# Patient Record
Sex: Female | Born: 1935 | Race: White | Hispanic: No | Marital: Single | Smoking: Former smoker
Health system: Southern US, Community
[De-identification: ages and names within clinical notes are randomized; demographics above are authoritative.]

## PROBLEM LIST (undated history)

## (undated) DIAGNOSIS — I2510 Atherosclerotic heart disease of native coronary artery without angina pectoris: Secondary | ICD-10-CM

## (undated) DIAGNOSIS — Q899 Congenital malformation, unspecified: Secondary | ICD-10-CM

## (undated) DIAGNOSIS — E079 Disorder of thyroid, unspecified: Secondary | ICD-10-CM

## (undated) DIAGNOSIS — K5790 Diverticulosis of intestine, part unspecified, without perforation or abscess without bleeding: Secondary | ICD-10-CM

## (undated) DIAGNOSIS — D649 Anemia, unspecified: Secondary | ICD-10-CM

## (undated) DIAGNOSIS — F0390 Unspecified dementia without behavioral disturbance: Secondary | ICD-10-CM

## (undated) DIAGNOSIS — C801 Malignant (primary) neoplasm, unspecified: Secondary | ICD-10-CM

## (undated) DIAGNOSIS — Z8739 Personal history of other diseases of the musculoskeletal system and connective tissue: Secondary | ICD-10-CM

## (undated) DIAGNOSIS — I10 Essential (primary) hypertension: Secondary | ICD-10-CM

## (undated) DIAGNOSIS — M1990 Unspecified osteoarthritis, unspecified site: Secondary | ICD-10-CM

## (undated) DIAGNOSIS — M810 Age-related osteoporosis without current pathological fracture: Secondary | ICD-10-CM

## (undated) HISTORY — PX: BOWEL RESECTION: SHX1257

## (undated) HISTORY — PX: ROTATOR CUFF REPAIR: SHX139

## (undated) HISTORY — DX: Congenital malformation, unspecified: Q89.9

## (undated) HISTORY — DX: Unspecified dementia, unspecified severity, without behavioral disturbance, psychotic disturbance, mood disturbance, and anxiety: F03.90

## (undated) HISTORY — DX: Age-related osteoporosis without current pathological fracture: M81.0

## (undated) HISTORY — DX: Malignant (primary) neoplasm, unspecified: C80.1

## (undated) HISTORY — DX: Atherosclerotic heart disease of native coronary artery without angina pectoris: I25.10

## (undated) HISTORY — DX: Unspecified osteoarthritis, unspecified site: M19.90

## (undated) HISTORY — PX: OTHER SURGICAL HISTORY: SHX169

## (undated) HISTORY — DX: Disorder of thyroid, unspecified: E07.9

## (undated) HISTORY — DX: Essential (primary) hypertension: I10

## (undated) HISTORY — DX: Personal history of other diseases of the musculoskeletal system and connective tissue: Z87.39

## (undated) HISTORY — DX: Diverticulosis of intestine, part unspecified, without perforation or abscess without bleeding: K57.90

## (undated) HISTORY — PX: ABDOMINAL HYSTERECTOMY: SHX81

## (undated) HISTORY — PX: COLOSTOMY: SHX63

## (undated) HISTORY — DX: Anemia, unspecified: D64.9

---

## 2011-10-04 DIAGNOSIS — Z87898 Personal history of other specified conditions: Secondary | ICD-10-CM | POA: Diagnosis not present

## 2011-11-04 DIAGNOSIS — Z87898 Personal history of other specified conditions: Secondary | ICD-10-CM | POA: Diagnosis not present

## 2011-12-02 DIAGNOSIS — Z87898 Personal history of other specified conditions: Secondary | ICD-10-CM | POA: Diagnosis not present

## 2012-01-02 DIAGNOSIS — Z87898 Personal history of other specified conditions: Secondary | ICD-10-CM | POA: Diagnosis not present

## 2012-02-01 DIAGNOSIS — K219 Gastro-esophageal reflux disease without esophagitis: Secondary | ICD-10-CM | POA: Diagnosis not present

## 2012-02-01 DIAGNOSIS — J309 Allergic rhinitis, unspecified: Secondary | ICD-10-CM | POA: Diagnosis not present

## 2012-02-01 DIAGNOSIS — Z87898 Personal history of other specified conditions: Secondary | ICD-10-CM | POA: Diagnosis not present

## 2012-02-01 DIAGNOSIS — J342 Deviated nasal septum: Secondary | ICD-10-CM | POA: Diagnosis not present

## 2012-03-03 DIAGNOSIS — Z87898 Personal history of other specified conditions: Secondary | ICD-10-CM | POA: Diagnosis not present

## 2012-04-02 DIAGNOSIS — Z87898 Personal history of other specified conditions: Secondary | ICD-10-CM | POA: Diagnosis not present

## 2012-04-04 DIAGNOSIS — Z0000 Encounter for general adult medical examination without abnormal findings: Secondary | ICD-10-CM | POA: Diagnosis not present

## 2012-04-17 DIAGNOSIS — Z0000 Encounter for general adult medical examination without abnormal findings: Secondary | ICD-10-CM | POA: Diagnosis not present

## 2012-04-17 DIAGNOSIS — E039 Hypothyroidism, unspecified: Secondary | ICD-10-CM | POA: Diagnosis not present

## 2012-04-17 DIAGNOSIS — Z79899 Other long term (current) drug therapy: Secondary | ICD-10-CM | POA: Diagnosis not present

## 2012-05-03 DIAGNOSIS — Z87898 Personal history of other specified conditions: Secondary | ICD-10-CM | POA: Diagnosis not present

## 2012-05-21 DIAGNOSIS — M1990 Unspecified osteoarthritis, unspecified site: Secondary | ICD-10-CM | POA: Diagnosis not present

## 2012-06-03 DIAGNOSIS — Z87898 Personal history of other specified conditions: Secondary | ICD-10-CM | POA: Diagnosis not present

## 2012-07-03 DIAGNOSIS — Z87898 Personal history of other specified conditions: Secondary | ICD-10-CM | POA: Diagnosis not present

## 2012-07-09 DIAGNOSIS — Z23 Encounter for immunization: Secondary | ICD-10-CM | POA: Diagnosis not present

## 2012-08-03 DIAGNOSIS — Z87898 Personal history of other specified conditions: Secondary | ICD-10-CM | POA: Diagnosis not present

## 2012-08-15 DIAGNOSIS — H6120 Impacted cerumen, unspecified ear: Secondary | ICD-10-CM | POA: Diagnosis not present

## 2012-08-15 DIAGNOSIS — J309 Allergic rhinitis, unspecified: Secondary | ICD-10-CM | POA: Diagnosis not present

## 2012-08-17 DIAGNOSIS — H25019 Cortical age-related cataract, unspecified eye: Secondary | ICD-10-CM | POA: Diagnosis not present

## 2012-08-17 DIAGNOSIS — H2510 Age-related nuclear cataract, unspecified eye: Secondary | ICD-10-CM | POA: Diagnosis not present

## 2012-08-17 DIAGNOSIS — H18459 Nodular corneal degeneration, unspecified eye: Secondary | ICD-10-CM | POA: Diagnosis not present

## 2012-08-17 DIAGNOSIS — H353190 Nonexudative age-related macular degeneration, unspecified eye, stage unspecified: Secondary | ICD-10-CM | POA: Diagnosis not present

## 2012-09-02 DIAGNOSIS — Z87898 Personal history of other specified conditions: Secondary | ICD-10-CM | POA: Diagnosis not present

## 2012-09-06 DIAGNOSIS — M6740 Ganglion, unspecified site: Secondary | ICD-10-CM | POA: Diagnosis not present

## 2012-10-03 DIAGNOSIS — Z87898 Personal history of other specified conditions: Secondary | ICD-10-CM | POA: Diagnosis not present

## 2012-10-15 DIAGNOSIS — M6740 Ganglion, unspecified site: Secondary | ICD-10-CM | POA: Diagnosis not present

## 2012-10-19 DIAGNOSIS — M6740 Ganglion, unspecified site: Secondary | ICD-10-CM | POA: Diagnosis not present

## 2012-11-03 DIAGNOSIS — Z87898 Personal history of other specified conditions: Secondary | ICD-10-CM | POA: Diagnosis not present

## 2012-11-05 DIAGNOSIS — M76899 Other specified enthesopathies of unspecified lower limb, excluding foot: Secondary | ICD-10-CM | POA: Diagnosis not present

## 2012-11-05 DIAGNOSIS — M6740 Ganglion, unspecified site: Secondary | ICD-10-CM | POA: Diagnosis not present

## 2012-11-19 DIAGNOSIS — J209 Acute bronchitis, unspecified: Secondary | ICD-10-CM | POA: Diagnosis not present

## 2012-11-19 DIAGNOSIS — R05 Cough: Secondary | ICD-10-CM | POA: Diagnosis not present

## 2012-11-19 DIAGNOSIS — J189 Pneumonia, unspecified organism: Secondary | ICD-10-CM | POA: Diagnosis not present

## 2012-11-21 DIAGNOSIS — J189 Pneumonia, unspecified organism: Secondary | ICD-10-CM | POA: Diagnosis not present

## 2012-11-21 DIAGNOSIS — R05 Cough: Secondary | ICD-10-CM | POA: Diagnosis not present

## 2012-11-21 DIAGNOSIS — J209 Acute bronchitis, unspecified: Secondary | ICD-10-CM | POA: Diagnosis not present

## 2012-11-21 DIAGNOSIS — R059 Cough, unspecified: Secondary | ICD-10-CM | POA: Diagnosis not present

## 2012-11-21 DIAGNOSIS — R0602 Shortness of breath: Secondary | ICD-10-CM | POA: Diagnosis not present

## 2012-11-22 DIAGNOSIS — R791 Abnormal coagulation profile: Secondary | ICD-10-CM | POA: Diagnosis not present

## 2012-11-22 DIAGNOSIS — R0602 Shortness of breath: Secondary | ICD-10-CM | POA: Diagnosis not present

## 2012-11-22 DIAGNOSIS — R05 Cough: Secondary | ICD-10-CM | POA: Diagnosis not present

## 2012-12-01 DIAGNOSIS — Z87898 Personal history of other specified conditions: Secondary | ICD-10-CM | POA: Diagnosis not present

## 2012-12-27 DIAGNOSIS — K649 Unspecified hemorrhoids: Secondary | ICD-10-CM | POA: Diagnosis not present

## 2012-12-27 DIAGNOSIS — B356 Tinea cruris: Secondary | ICD-10-CM | POA: Diagnosis not present

## 2012-12-31 DIAGNOSIS — M545 Low back pain: Secondary | ICD-10-CM | POA: Diagnosis not present

## 2013-01-01 DIAGNOSIS — Z87898 Personal history of other specified conditions: Secondary | ICD-10-CM | POA: Diagnosis not present

## 2013-01-09 DIAGNOSIS — R300 Dysuria: Secondary | ICD-10-CM | POA: Diagnosis not present

## 2013-01-09 DIAGNOSIS — N899 Noninflammatory disorder of vagina, unspecified: Secondary | ICD-10-CM | POA: Diagnosis not present

## 2013-01-14 DIAGNOSIS — M4800 Spinal stenosis, site unspecified: Secondary | ICD-10-CM | POA: Diagnosis not present

## 2013-01-14 DIAGNOSIS — M545 Low back pain: Secondary | ICD-10-CM | POA: Diagnosis not present

## 2013-01-23 DIAGNOSIS — N760 Acute vaginitis: Secondary | ICD-10-CM | POA: Diagnosis not present

## 2013-01-24 DIAGNOSIS — M545 Low back pain: Secondary | ICD-10-CM | POA: Diagnosis not present

## 2013-01-24 DIAGNOSIS — M4800 Spinal stenosis, site unspecified: Secondary | ICD-10-CM | POA: Diagnosis not present

## 2013-01-30 DIAGNOSIS — M545 Low back pain: Secondary | ICD-10-CM | POA: Diagnosis not present

## 2013-01-30 DIAGNOSIS — M4800 Spinal stenosis, site unspecified: Secondary | ICD-10-CM | POA: Diagnosis not present

## 2013-01-31 DIAGNOSIS — M4800 Spinal stenosis, site unspecified: Secondary | ICD-10-CM | POA: Diagnosis not present

## 2013-01-31 DIAGNOSIS — M545 Low back pain: Secondary | ICD-10-CM | POA: Diagnosis not present

## 2013-01-31 DIAGNOSIS — Z87898 Personal history of other specified conditions: Secondary | ICD-10-CM | POA: Diagnosis not present

## 2013-02-08 DIAGNOSIS — I10 Essential (primary) hypertension: Secondary | ICD-10-CM | POA: Diagnosis not present

## 2013-02-08 DIAGNOSIS — L259 Unspecified contact dermatitis, unspecified cause: Secondary | ICD-10-CM | POA: Diagnosis not present

## 2013-02-08 DIAGNOSIS — K625 Hemorrhage of anus and rectum: Secondary | ICD-10-CM | POA: Diagnosis not present

## 2013-02-08 DIAGNOSIS — K219 Gastro-esophageal reflux disease without esophagitis: Secondary | ICD-10-CM | POA: Diagnosis not present

## 2013-02-11 DIAGNOSIS — M4800 Spinal stenosis, site unspecified: Secondary | ICD-10-CM | POA: Diagnosis not present

## 2013-02-11 DIAGNOSIS — M545 Low back pain: Secondary | ICD-10-CM | POA: Diagnosis not present

## 2013-02-14 DIAGNOSIS — H6120 Impacted cerumen, unspecified ear: Secondary | ICD-10-CM | POA: Diagnosis not present

## 2013-02-14 DIAGNOSIS — K1370 Unspecified lesions of oral mucosa: Secondary | ICD-10-CM | POA: Diagnosis not present

## 2013-03-03 DIAGNOSIS — Z87898 Personal history of other specified conditions: Secondary | ICD-10-CM | POA: Diagnosis not present

## 2013-03-21 DIAGNOSIS — K1370 Unspecified lesions of oral mucosa: Secondary | ICD-10-CM | POA: Diagnosis not present

## 2013-04-01 DIAGNOSIS — R1312 Dysphagia, oropharyngeal phase: Secondary | ICD-10-CM | POA: Diagnosis not present

## 2013-04-01 DIAGNOSIS — D649 Anemia, unspecified: Secondary | ICD-10-CM | POA: Diagnosis not present

## 2013-04-01 DIAGNOSIS — K1370 Unspecified lesions of oral mucosa: Secondary | ICD-10-CM | POA: Diagnosis not present

## 2013-04-01 DIAGNOSIS — Z79899 Other long term (current) drug therapy: Secondary | ICD-10-CM | POA: Diagnosis not present

## 2013-04-01 DIAGNOSIS — M2550 Pain in unspecified joint: Secondary | ICD-10-CM | POA: Diagnosis not present

## 2013-04-01 DIAGNOSIS — R5383 Other fatigue: Secondary | ICD-10-CM | POA: Diagnosis not present

## 2013-04-01 DIAGNOSIS — F29 Unspecified psychosis not due to a substance or known physiological condition: Secondary | ICD-10-CM | POA: Diagnosis not present

## 2013-04-01 DIAGNOSIS — N390 Urinary tract infection, site not specified: Secondary | ICD-10-CM | POA: Diagnosis not present

## 2013-04-01 DIAGNOSIS — R5381 Other malaise: Secondary | ICD-10-CM | POA: Diagnosis not present

## 2013-04-01 DIAGNOSIS — K219 Gastro-esophageal reflux disease without esophagitis: Secondary | ICD-10-CM | POA: Diagnosis not present

## 2013-04-01 DIAGNOSIS — E559 Vitamin D deficiency, unspecified: Secondary | ICD-10-CM | POA: Diagnosis not present

## 2013-04-01 DIAGNOSIS — E860 Dehydration: Secondary | ICD-10-CM | POA: Diagnosis not present

## 2013-04-01 DIAGNOSIS — F4489 Other dissociative and conversion disorders: Secondary | ICD-10-CM | POA: Diagnosis not present

## 2013-04-01 DIAGNOSIS — J342 Deviated nasal septum: Secondary | ICD-10-CM | POA: Diagnosis not present

## 2013-04-01 DIAGNOSIS — J309 Allergic rhinitis, unspecified: Secondary | ICD-10-CM | POA: Diagnosis not present

## 2013-04-01 DIAGNOSIS — E039 Hypothyroidism, unspecified: Secondary | ICD-10-CM | POA: Diagnosis not present

## 2013-04-01 DIAGNOSIS — F068 Other specified mental disorders due to known physiological condition: Secondary | ICD-10-CM | POA: Diagnosis not present

## 2013-04-02 DIAGNOSIS — Z87898 Personal history of other specified conditions: Secondary | ICD-10-CM | POA: Diagnosis not present

## 2013-04-03 DIAGNOSIS — R634 Abnormal weight loss: Secondary | ICD-10-CM | POA: Diagnosis not present

## 2013-04-03 DIAGNOSIS — K56609 Unspecified intestinal obstruction, unspecified as to partial versus complete obstruction: Secondary | ICD-10-CM | POA: Diagnosis not present

## 2013-04-03 DIAGNOSIS — Z8542 Personal history of malignant neoplasm of other parts of uterus: Secondary | ICD-10-CM | POA: Diagnosis not present

## 2013-04-03 DIAGNOSIS — K5289 Other specified noninfective gastroenteritis and colitis: Secondary | ICD-10-CM | POA: Diagnosis not present

## 2013-04-03 DIAGNOSIS — K5730 Diverticulosis of large intestine without perforation or abscess without bleeding: Secondary | ICD-10-CM | POA: Diagnosis not present

## 2013-04-03 DIAGNOSIS — D649 Anemia, unspecified: Secondary | ICD-10-CM | POA: Diagnosis present

## 2013-04-03 DIAGNOSIS — J9600 Acute respiratory failure, unspecified whether with hypoxia or hypercapnia: Secondary | ICD-10-CM | POA: Diagnosis not present

## 2013-04-03 DIAGNOSIS — K6389 Other specified diseases of intestine: Secondary | ICD-10-CM | POA: Diagnosis not present

## 2013-04-03 DIAGNOSIS — R079 Chest pain, unspecified: Secondary | ICD-10-CM | POA: Diagnosis not present

## 2013-04-03 DIAGNOSIS — K838 Other specified diseases of biliary tract: Secondary | ICD-10-CM | POA: Diagnosis not present

## 2013-04-03 DIAGNOSIS — R6883 Chills (without fever): Secondary | ICD-10-CM | POA: Diagnosis not present

## 2013-04-03 DIAGNOSIS — M2550 Pain in unspecified joint: Secondary | ICD-10-CM | POA: Diagnosis not present

## 2013-04-03 DIAGNOSIS — R404 Transient alteration of awareness: Secondary | ICD-10-CM | POA: Diagnosis not present

## 2013-04-03 DIAGNOSIS — K660 Peritoneal adhesions (postprocedural) (postinfection): Secondary | ICD-10-CM | POA: Diagnosis not present

## 2013-04-03 DIAGNOSIS — K1370 Unspecified lesions of oral mucosa: Secondary | ICD-10-CM | POA: Diagnosis present

## 2013-04-03 DIAGNOSIS — F0390 Unspecified dementia without behavioral disturbance: Secondary | ICD-10-CM | POA: Diagnosis present

## 2013-04-03 DIAGNOSIS — R109 Unspecified abdominal pain: Secondary | ICD-10-CM | POA: Diagnosis not present

## 2013-04-03 DIAGNOSIS — K651 Peritoneal abscess: Secondary | ICD-10-CM | POA: Diagnosis not present

## 2013-04-03 DIAGNOSIS — A419 Sepsis, unspecified organism: Secondary | ICD-10-CM | POA: Diagnosis not present

## 2013-04-03 DIAGNOSIS — Z5331 Laparoscopic surgical procedure converted to open procedure: Secondary | ICD-10-CM | POA: Diagnosis not present

## 2013-04-03 DIAGNOSIS — M1990 Unspecified osteoarthritis, unspecified site: Secondary | ICD-10-CM | POA: Diagnosis present

## 2013-04-03 DIAGNOSIS — R5381 Other malaise: Secondary | ICD-10-CM | POA: Diagnosis not present

## 2013-04-03 DIAGNOSIS — Z452 Encounter for adjustment and management of vascular access device: Secondary | ICD-10-CM | POA: Diagnosis not present

## 2013-04-03 DIAGNOSIS — N184 Chronic kidney disease, stage 4 (severe): Secondary | ICD-10-CM | POA: Diagnosis not present

## 2013-04-03 DIAGNOSIS — D72829 Elevated white blood cell count, unspecified: Secondary | ICD-10-CM | POA: Diagnosis not present

## 2013-04-03 DIAGNOSIS — A09 Infectious gastroenteritis and colitis, unspecified: Secondary | ICD-10-CM | POA: Diagnosis not present

## 2013-04-03 DIAGNOSIS — K626 Ulcer of anus and rectum: Secondary | ICD-10-CM | POA: Diagnosis not present

## 2013-04-03 DIAGNOSIS — K631 Perforation of intestine (nontraumatic): Secondary | ICD-10-CM | POA: Diagnosis not present

## 2013-04-03 DIAGNOSIS — K565 Intestinal adhesions [bands], unspecified as to partial versus complete obstruction: Secondary | ICD-10-CM | POA: Diagnosis not present

## 2013-04-03 DIAGNOSIS — K650 Generalized (acute) peritonitis: Secondary | ICD-10-CM | POA: Diagnosis not present

## 2013-04-03 DIAGNOSIS — E43 Unspecified severe protein-calorie malnutrition: Secondary | ICD-10-CM | POA: Diagnosis not present

## 2013-04-03 DIAGNOSIS — R198 Other specified symptoms and signs involving the digestive system and abdomen: Secondary | ICD-10-CM | POA: Diagnosis not present

## 2013-04-03 DIAGNOSIS — F4489 Other dissociative and conversion disorders: Secondary | ICD-10-CM | POA: Diagnosis not present

## 2013-04-03 DIAGNOSIS — E860 Dehydration: Secondary | ICD-10-CM | POA: Diagnosis not present

## 2013-04-03 DIAGNOSIS — R5383 Other fatigue: Secondary | ICD-10-CM | POA: Diagnosis not present

## 2013-04-03 DIAGNOSIS — I959 Hypotension, unspecified: Secondary | ICD-10-CM | POA: Diagnosis not present

## 2013-04-03 DIAGNOSIS — F068 Other specified mental disorders due to known physiological condition: Secondary | ICD-10-CM | POA: Diagnosis not present

## 2013-04-03 DIAGNOSIS — K5732 Diverticulitis of large intestine without perforation or abscess without bleeding: Secondary | ICD-10-CM | POA: Diagnosis not present

## 2013-04-03 DIAGNOSIS — N390 Urinary tract infection, site not specified: Secondary | ICD-10-CM | POA: Diagnosis not present

## 2013-04-04 DIAGNOSIS — Z452 Encounter for adjustment and management of vascular access device: Secondary | ICD-10-CM | POA: Diagnosis not present

## 2013-04-05 DIAGNOSIS — K838 Other specified diseases of biliary tract: Secondary | ICD-10-CM | POA: Diagnosis not present

## 2013-04-05 DIAGNOSIS — R198 Other specified symptoms and signs involving the digestive system and abdomen: Secondary | ICD-10-CM | POA: Diagnosis not present

## 2013-04-05 DIAGNOSIS — K5732 Diverticulitis of large intestine without perforation or abscess without bleeding: Secondary | ICD-10-CM | POA: Diagnosis not present

## 2013-04-06 DIAGNOSIS — Z4682 Encounter for fitting and adjustment of non-vascular catheter: Secondary | ICD-10-CM | POA: Diagnosis not present

## 2013-04-06 DIAGNOSIS — I10 Essential (primary) hypertension: Secondary | ICD-10-CM | POA: Diagnosis not present

## 2013-04-06 DIAGNOSIS — R188 Other ascites: Secondary | ICD-10-CM | POA: Diagnosis not present

## 2013-04-06 DIAGNOSIS — G92 Toxic encephalopathy: Secondary | ICD-10-CM | POA: Diagnosis not present

## 2013-04-06 DIAGNOSIS — B029 Zoster without complications: Secondary | ICD-10-CM | POA: Diagnosis present

## 2013-04-06 DIAGNOSIS — A419 Sepsis, unspecified organism: Secondary | ICD-10-CM | POA: Diagnosis not present

## 2013-04-06 DIAGNOSIS — J9600 Acute respiratory failure, unspecified whether with hypoxia or hypercapnia: Secondary | ICD-10-CM | POA: Diagnosis not present

## 2013-04-06 DIAGNOSIS — I369 Nonrheumatic tricuspid valve disorder, unspecified: Secondary | ICD-10-CM | POA: Diagnosis not present

## 2013-04-06 DIAGNOSIS — I5033 Acute on chronic diastolic (congestive) heart failure: Secondary | ICD-10-CM | POA: Diagnosis present

## 2013-04-06 DIAGNOSIS — E872 Acidosis: Secondary | ICD-10-CM | POA: Diagnosis not present

## 2013-04-06 DIAGNOSIS — I2789 Other specified pulmonary heart diseases: Secondary | ICD-10-CM | POA: Diagnosis not present

## 2013-04-06 DIAGNOSIS — Z8542 Personal history of malignant neoplasm of other parts of uterus: Secondary | ICD-10-CM | POA: Diagnosis not present

## 2013-04-06 DIAGNOSIS — E8779 Other fluid overload: Secondary | ICD-10-CM | POA: Diagnosis not present

## 2013-04-06 DIAGNOSIS — K560 Paralytic ileus: Secondary | ICD-10-CM | POA: Diagnosis not present

## 2013-04-06 DIAGNOSIS — K650 Generalized (acute) peritonitis: Secondary | ICD-10-CM | POA: Diagnosis not present

## 2013-04-06 DIAGNOSIS — K631 Perforation of intestine (nontraumatic): Secondary | ICD-10-CM | POA: Diagnosis not present

## 2013-04-06 DIAGNOSIS — J811 Chronic pulmonary edema: Secondary | ICD-10-CM | POA: Diagnosis not present

## 2013-04-06 DIAGNOSIS — Z5189 Encounter for other specified aftercare: Secondary | ICD-10-CM | POA: Diagnosis not present

## 2013-04-06 DIAGNOSIS — Z933 Colostomy status: Secondary | ICD-10-CM | POA: Diagnosis not present

## 2013-04-06 DIAGNOSIS — R6521 Severe sepsis with septic shock: Secondary | ICD-10-CM | POA: Diagnosis present

## 2013-04-06 DIAGNOSIS — R279 Unspecified lack of coordination: Secondary | ICD-10-CM | POA: Diagnosis not present

## 2013-04-06 DIAGNOSIS — E43 Unspecified severe protein-calorie malnutrition: Secondary | ICD-10-CM | POA: Diagnosis not present

## 2013-04-06 DIAGNOSIS — R0602 Shortness of breath: Secondary | ICD-10-CM | POA: Diagnosis not present

## 2013-04-06 DIAGNOSIS — I059 Rheumatic mitral valve disease, unspecified: Secondary | ICD-10-CM | POA: Diagnosis not present

## 2013-04-06 DIAGNOSIS — D649 Anemia, unspecified: Secondary | ICD-10-CM | POA: Diagnosis not present

## 2013-04-06 DIAGNOSIS — G4700 Insomnia, unspecified: Secondary | ICD-10-CM | POA: Diagnosis present

## 2013-04-06 DIAGNOSIS — R6889 Other general symptoms and signs: Secondary | ICD-10-CM | POA: Diagnosis not present

## 2013-04-06 DIAGNOSIS — J90 Pleural effusion, not elsewhere classified: Secondary | ICD-10-CM | POA: Diagnosis not present

## 2013-04-06 DIAGNOSIS — K5730 Diverticulosis of large intestine without perforation or abscess without bleeding: Secondary | ICD-10-CM | POA: Diagnosis not present

## 2013-04-06 DIAGNOSIS — F0390 Unspecified dementia without behavioral disturbance: Secondary | ICD-10-CM | POA: Diagnosis present

## 2013-04-06 DIAGNOSIS — R894 Abnormal immunological findings in specimens from other organs, systems and tissues: Secondary | ICD-10-CM | POA: Diagnosis not present

## 2013-04-06 DIAGNOSIS — K630 Abscess of intestine: Secondary | ICD-10-CM | POA: Diagnosis present

## 2013-04-06 DIAGNOSIS — M1990 Unspecified osteoarthritis, unspecified site: Secondary | ICD-10-CM | POA: Diagnosis present

## 2013-04-06 DIAGNOSIS — R5381 Other malaise: Secondary | ICD-10-CM | POA: Diagnosis not present

## 2013-04-06 DIAGNOSIS — R1310 Dysphagia, unspecified: Secondary | ICD-10-CM | POA: Diagnosis not present

## 2013-04-06 DIAGNOSIS — M949 Disorder of cartilage, unspecified: Secondary | ICD-10-CM | POA: Diagnosis present

## 2013-04-06 DIAGNOSIS — I2510 Atherosclerotic heart disease of native coronary artery without angina pectoris: Secondary | ICD-10-CM | POA: Diagnosis not present

## 2013-04-06 DIAGNOSIS — K5732 Diverticulitis of large intestine without perforation or abscess without bleeding: Secondary | ICD-10-CM | POA: Diagnosis present

## 2013-04-06 DIAGNOSIS — I959 Hypotension, unspecified: Secondary | ICD-10-CM | POA: Diagnosis not present

## 2013-04-06 DIAGNOSIS — K651 Peritoneal abscess: Secondary | ICD-10-CM | POA: Diagnosis not present

## 2013-04-06 DIAGNOSIS — K56609 Unspecified intestinal obstruction, unspecified as to partial versus complete obstruction: Secondary | ICD-10-CM | POA: Diagnosis not present

## 2013-04-06 DIAGNOSIS — D62 Acute posthemorrhagic anemia: Secondary | ICD-10-CM | POA: Diagnosis not present

## 2013-04-06 DIAGNOSIS — R269 Unspecified abnormalities of gait and mobility: Secondary | ICD-10-CM | POA: Diagnosis not present

## 2013-04-06 DIAGNOSIS — E039 Hypothyroidism, unspecified: Secondary | ICD-10-CM | POA: Diagnosis present

## 2013-04-06 DIAGNOSIS — C579 Malignant neoplasm of female genital organ, unspecified: Secondary | ICD-10-CM | POA: Diagnosis not present

## 2013-04-06 DIAGNOSIS — R0902 Hypoxemia: Secondary | ICD-10-CM | POA: Diagnosis not present

## 2013-04-06 DIAGNOSIS — N179 Acute kidney failure, unspecified: Secondary | ICD-10-CM | POA: Diagnosis not present

## 2013-04-06 DIAGNOSIS — J9819 Other pulmonary collapse: Secondary | ICD-10-CM | POA: Diagnosis not present

## 2013-04-06 DIAGNOSIS — J189 Pneumonia, unspecified organism: Secondary | ICD-10-CM | POA: Diagnosis not present

## 2013-04-24 DIAGNOSIS — R5381 Other malaise: Secondary | ICD-10-CM | POA: Diagnosis not present

## 2013-04-24 DIAGNOSIS — K631 Perforation of intestine (nontraumatic): Secondary | ICD-10-CM | POA: Diagnosis not present

## 2013-04-24 DIAGNOSIS — R21 Rash and other nonspecific skin eruption: Secondary | ICD-10-CM | POA: Diagnosis not present

## 2013-04-24 DIAGNOSIS — K5730 Diverticulosis of large intestine without perforation or abscess without bleeding: Secondary | ICD-10-CM | POA: Diagnosis not present

## 2013-04-24 DIAGNOSIS — K921 Melena: Secondary | ICD-10-CM | POA: Diagnosis not present

## 2013-04-24 DIAGNOSIS — R269 Unspecified abnormalities of gait and mobility: Secondary | ICD-10-CM | POA: Diagnosis not present

## 2013-04-24 DIAGNOSIS — Z933 Colostomy status: Secondary | ICD-10-CM | POA: Diagnosis not present

## 2013-04-24 DIAGNOSIS — Z87898 Personal history of other specified conditions: Secondary | ICD-10-CM | POA: Diagnosis not present

## 2013-04-24 DIAGNOSIS — Z5189 Encounter for other specified aftercare: Secondary | ICD-10-CM | POA: Diagnosis not present

## 2013-04-24 DIAGNOSIS — I10 Essential (primary) hypertension: Secondary | ICD-10-CM | POA: Diagnosis not present

## 2013-04-24 DIAGNOSIS — D649 Anemia, unspecified: Secondary | ICD-10-CM | POA: Diagnosis not present

## 2013-04-24 DIAGNOSIS — C579 Malignant neoplasm of female genital organ, unspecified: Secondary | ICD-10-CM | POA: Diagnosis not present

## 2013-04-24 DIAGNOSIS — M549 Dorsalgia, unspecified: Secondary | ICD-10-CM | POA: Diagnosis not present

## 2013-04-24 DIAGNOSIS — R6889 Other general symptoms and signs: Secondary | ICD-10-CM | POA: Diagnosis not present

## 2013-04-24 DIAGNOSIS — R059 Cough, unspecified: Secondary | ICD-10-CM | POA: Diagnosis not present

## 2013-04-24 DIAGNOSIS — R279 Unspecified lack of coordination: Secondary | ICD-10-CM | POA: Diagnosis not present

## 2013-04-24 DIAGNOSIS — I2510 Atherosclerotic heart disease of native coronary artery without angina pectoris: Secondary | ICD-10-CM | POA: Diagnosis not present

## 2013-04-24 DIAGNOSIS — F0390 Unspecified dementia without behavioral disturbance: Secondary | ICD-10-CM | POA: Diagnosis not present

## 2013-04-24 DIAGNOSIS — R112 Nausea with vomiting, unspecified: Secondary | ICD-10-CM | POA: Diagnosis not present

## 2013-04-24 DIAGNOSIS — R109 Unspecified abdominal pain: Secondary | ICD-10-CM | POA: Diagnosis not present

## 2013-04-25 DIAGNOSIS — F0390 Unspecified dementia without behavioral disturbance: Secondary | ICD-10-CM | POA: Diagnosis not present

## 2013-04-25 DIAGNOSIS — D649 Anemia, unspecified: Secondary | ICD-10-CM | POA: Diagnosis not present

## 2013-05-13 DIAGNOSIS — M549 Dorsalgia, unspecified: Secondary | ICD-10-CM | POA: Diagnosis not present

## 2013-05-13 DIAGNOSIS — D649 Anemia, unspecified: Secondary | ICD-10-CM | POA: Diagnosis not present

## 2013-05-23 DIAGNOSIS — M549 Dorsalgia, unspecified: Secondary | ICD-10-CM | POA: Diagnosis not present

## 2013-06-16 DIAGNOSIS — D649 Anemia, unspecified: Secondary | ICD-10-CM | POA: Diagnosis not present

## 2013-06-16 DIAGNOSIS — I10 Essential (primary) hypertension: Secondary | ICD-10-CM | POA: Diagnosis not present

## 2013-06-16 DIAGNOSIS — F0390 Unspecified dementia without behavioral disturbance: Secondary | ICD-10-CM | POA: Diagnosis not present

## 2013-06-20 DIAGNOSIS — R109 Unspecified abdominal pain: Secondary | ICD-10-CM | POA: Diagnosis not present

## 2013-06-27 DIAGNOSIS — R112 Nausea with vomiting, unspecified: Secondary | ICD-10-CM | POA: Diagnosis not present

## 2013-06-27 DIAGNOSIS — D649 Anemia, unspecified: Secondary | ICD-10-CM | POA: Diagnosis not present

## 2013-06-27 DIAGNOSIS — K921 Melena: Secondary | ICD-10-CM | POA: Diagnosis not present

## 2013-07-03 DIAGNOSIS — Z87898 Personal history of other specified conditions: Secondary | ICD-10-CM | POA: Diagnosis not present

## 2013-07-05 DIAGNOSIS — D649 Anemia, unspecified: Secondary | ICD-10-CM | POA: Diagnosis not present

## 2013-07-05 DIAGNOSIS — R21 Rash and other nonspecific skin eruption: Secondary | ICD-10-CM | POA: Diagnosis not present

## 2013-07-22 DIAGNOSIS — R05 Cough: Secondary | ICD-10-CM | POA: Diagnosis not present

## 2013-08-03 DIAGNOSIS — Z87898 Personal history of other specified conditions: Secondary | ICD-10-CM | POA: Diagnosis not present

## 2013-08-06 DIAGNOSIS — Z79899 Other long term (current) drug therapy: Secondary | ICD-10-CM | POA: Diagnosis not present

## 2013-08-07 DIAGNOSIS — Z79899 Other long term (current) drug therapy: Secondary | ICD-10-CM | POA: Diagnosis not present

## 2013-08-12 DIAGNOSIS — D649 Anemia, unspecified: Secondary | ICD-10-CM | POA: Diagnosis not present

## 2013-08-12 DIAGNOSIS — R21 Rash and other nonspecific skin eruption: Secondary | ICD-10-CM | POA: Diagnosis not present

## 2013-08-14 DIAGNOSIS — D649 Anemia, unspecified: Secondary | ICD-10-CM | POA: Diagnosis not present

## 2013-08-14 DIAGNOSIS — Z79899 Other long term (current) drug therapy: Secondary | ICD-10-CM | POA: Diagnosis not present

## 2013-08-18 DIAGNOSIS — R21 Rash and other nonspecific skin eruption: Secondary | ICD-10-CM | POA: Diagnosis not present

## 2013-09-02 DIAGNOSIS — Z87898 Personal history of other specified conditions: Secondary | ICD-10-CM | POA: Diagnosis not present

## 2013-09-05 DIAGNOSIS — I2510 Atherosclerotic heart disease of native coronary artery without angina pectoris: Secondary | ICD-10-CM | POA: Diagnosis not present

## 2013-09-05 DIAGNOSIS — D649 Anemia, unspecified: Secondary | ICD-10-CM | POA: Diagnosis not present

## 2013-09-12 DIAGNOSIS — D649 Anemia, unspecified: Secondary | ICD-10-CM | POA: Diagnosis not present

## 2013-09-17 DIAGNOSIS — R279 Unspecified lack of coordination: Secondary | ICD-10-CM | POA: Diagnosis not present

## 2013-09-20 DIAGNOSIS — R279 Unspecified lack of coordination: Secondary | ICD-10-CM | POA: Diagnosis not present

## 2013-09-23 DIAGNOSIS — R279 Unspecified lack of coordination: Secondary | ICD-10-CM | POA: Diagnosis not present

## 2013-09-24 DIAGNOSIS — R279 Unspecified lack of coordination: Secondary | ICD-10-CM | POA: Diagnosis not present

## 2013-09-25 DIAGNOSIS — R279 Unspecified lack of coordination: Secondary | ICD-10-CM | POA: Diagnosis not present

## 2013-09-26 DIAGNOSIS — R279 Unspecified lack of coordination: Secondary | ICD-10-CM | POA: Diagnosis not present

## 2013-09-27 DIAGNOSIS — R279 Unspecified lack of coordination: Secondary | ICD-10-CM | POA: Diagnosis not present

## 2013-10-01 DIAGNOSIS — R279 Unspecified lack of coordination: Secondary | ICD-10-CM | POA: Diagnosis not present

## 2013-10-02 DIAGNOSIS — R279 Unspecified lack of coordination: Secondary | ICD-10-CM | POA: Diagnosis not present

## 2013-10-03 DIAGNOSIS — J111 Influenza due to unidentified influenza virus with other respiratory manifestations: Secondary | ICD-10-CM | POA: Diagnosis not present

## 2013-10-04 DIAGNOSIS — R279 Unspecified lack of coordination: Secondary | ICD-10-CM | POA: Diagnosis not present

## 2013-10-06 DIAGNOSIS — J069 Acute upper respiratory infection, unspecified: Secondary | ICD-10-CM | POA: Diagnosis not present

## 2013-10-08 DIAGNOSIS — R279 Unspecified lack of coordination: Secondary | ICD-10-CM | POA: Diagnosis not present

## 2013-10-10 DIAGNOSIS — J069 Acute upper respiratory infection, unspecified: Secondary | ICD-10-CM | POA: Diagnosis not present

## 2013-10-10 DIAGNOSIS — R279 Unspecified lack of coordination: Secondary | ICD-10-CM | POA: Diagnosis not present

## 2013-10-15 DIAGNOSIS — E538 Deficiency of other specified B group vitamins: Secondary | ICD-10-CM | POA: Diagnosis not present

## 2013-10-15 DIAGNOSIS — D649 Anemia, unspecified: Secondary | ICD-10-CM | POA: Diagnosis not present

## 2013-10-15 DIAGNOSIS — R279 Unspecified lack of coordination: Secondary | ICD-10-CM | POA: Diagnosis not present

## 2013-10-17 DIAGNOSIS — R279 Unspecified lack of coordination: Secondary | ICD-10-CM | POA: Diagnosis not present

## 2013-10-28 DIAGNOSIS — R279 Unspecified lack of coordination: Secondary | ICD-10-CM | POA: Diagnosis not present

## 2013-10-31 DIAGNOSIS — R279 Unspecified lack of coordination: Secondary | ICD-10-CM | POA: Diagnosis not present

## 2013-11-13 DIAGNOSIS — R262 Difficulty in walking, not elsewhere classified: Secondary | ICD-10-CM | POA: Diagnosis not present

## 2013-11-13 DIAGNOSIS — M1710 Unilateral primary osteoarthritis, unspecified knee: Secondary | ICD-10-CM | POA: Diagnosis not present

## 2013-11-13 DIAGNOSIS — F0390 Unspecified dementia without behavioral disturbance: Secondary | ICD-10-CM | POA: Diagnosis not present

## 2013-11-13 DIAGNOSIS — Q71899 Other reduction defects of unspecified upper limb: Secondary | ICD-10-CM | POA: Diagnosis not present

## 2013-11-13 DIAGNOSIS — R279 Unspecified lack of coordination: Secondary | ICD-10-CM | POA: Diagnosis not present

## 2013-11-13 DIAGNOSIS — I10 Essential (primary) hypertension: Secondary | ICD-10-CM | POA: Diagnosis not present

## 2013-11-13 DIAGNOSIS — Z993 Dependence on wheelchair: Secondary | ICD-10-CM | POA: Diagnosis not present

## 2013-11-13 DIAGNOSIS — Z433 Encounter for attention to colostomy: Secondary | ICD-10-CM | POA: Diagnosis not present

## 2013-11-15 DIAGNOSIS — I10 Essential (primary) hypertension: Secondary | ICD-10-CM | POA: Diagnosis not present

## 2013-11-15 DIAGNOSIS — R262 Difficulty in walking, not elsewhere classified: Secondary | ICD-10-CM | POA: Diagnosis not present

## 2013-11-15 DIAGNOSIS — Q71899 Other reduction defects of unspecified upper limb: Secondary | ICD-10-CM | POA: Diagnosis not present

## 2013-11-15 DIAGNOSIS — Z433 Encounter for attention to colostomy: Secondary | ICD-10-CM | POA: Diagnosis not present

## 2013-11-15 DIAGNOSIS — F0390 Unspecified dementia without behavioral disturbance: Secondary | ICD-10-CM | POA: Diagnosis not present

## 2013-11-15 DIAGNOSIS — M1710 Unilateral primary osteoarthritis, unspecified knee: Secondary | ICD-10-CM | POA: Diagnosis not present

## 2013-11-18 DIAGNOSIS — I10 Essential (primary) hypertension: Secondary | ICD-10-CM | POA: Diagnosis not present

## 2013-11-18 DIAGNOSIS — M1710 Unilateral primary osteoarthritis, unspecified knee: Secondary | ICD-10-CM | POA: Diagnosis not present

## 2013-11-18 DIAGNOSIS — Q71899 Other reduction defects of unspecified upper limb: Secondary | ICD-10-CM | POA: Diagnosis not present

## 2013-11-18 DIAGNOSIS — F0390 Unspecified dementia without behavioral disturbance: Secondary | ICD-10-CM | POA: Diagnosis not present

## 2013-11-18 DIAGNOSIS — R262 Difficulty in walking, not elsewhere classified: Secondary | ICD-10-CM | POA: Diagnosis not present

## 2013-11-18 DIAGNOSIS — Z433 Encounter for attention to colostomy: Secondary | ICD-10-CM | POA: Diagnosis not present

## 2013-11-20 DIAGNOSIS — F0390 Unspecified dementia without behavioral disturbance: Secondary | ICD-10-CM | POA: Diagnosis not present

## 2013-11-20 DIAGNOSIS — Z433 Encounter for attention to colostomy: Secondary | ICD-10-CM | POA: Diagnosis not present

## 2013-11-20 DIAGNOSIS — D649 Anemia, unspecified: Secondary | ICD-10-CM | POA: Diagnosis not present

## 2013-11-20 DIAGNOSIS — M1710 Unilateral primary osteoarthritis, unspecified knee: Secondary | ICD-10-CM | POA: Diagnosis not present

## 2013-11-20 DIAGNOSIS — Q71899 Other reduction defects of unspecified upper limb: Secondary | ICD-10-CM | POA: Diagnosis not present

## 2013-11-20 DIAGNOSIS — I10 Essential (primary) hypertension: Secondary | ICD-10-CM | POA: Diagnosis not present

## 2013-11-20 DIAGNOSIS — R262 Difficulty in walking, not elsewhere classified: Secondary | ICD-10-CM | POA: Diagnosis not present

## 2013-11-20 DIAGNOSIS — Z933 Colostomy status: Secondary | ICD-10-CM | POA: Diagnosis not present

## 2013-11-20 DIAGNOSIS — I2510 Atherosclerotic heart disease of native coronary artery without angina pectoris: Secondary | ICD-10-CM | POA: Diagnosis not present

## 2013-11-21 DIAGNOSIS — M1710 Unilateral primary osteoarthritis, unspecified knee: Secondary | ICD-10-CM | POA: Diagnosis not present

## 2013-11-21 DIAGNOSIS — R262 Difficulty in walking, not elsewhere classified: Secondary | ICD-10-CM | POA: Diagnosis not present

## 2013-11-21 DIAGNOSIS — F0390 Unspecified dementia without behavioral disturbance: Secondary | ICD-10-CM | POA: Diagnosis not present

## 2013-11-21 DIAGNOSIS — Z433 Encounter for attention to colostomy: Secondary | ICD-10-CM | POA: Diagnosis not present

## 2013-11-21 DIAGNOSIS — I10 Essential (primary) hypertension: Secondary | ICD-10-CM | POA: Diagnosis not present

## 2013-11-21 DIAGNOSIS — Q71899 Other reduction defects of unspecified upper limb: Secondary | ICD-10-CM | POA: Diagnosis not present

## 2013-11-22 DIAGNOSIS — F0390 Unspecified dementia without behavioral disturbance: Secondary | ICD-10-CM | POA: Diagnosis not present

## 2013-11-22 DIAGNOSIS — Q71899 Other reduction defects of unspecified upper limb: Secondary | ICD-10-CM | POA: Diagnosis not present

## 2013-11-22 DIAGNOSIS — Z433 Encounter for attention to colostomy: Secondary | ICD-10-CM | POA: Diagnosis not present

## 2013-11-22 DIAGNOSIS — I10 Essential (primary) hypertension: Secondary | ICD-10-CM | POA: Diagnosis not present

## 2013-11-22 DIAGNOSIS — R262 Difficulty in walking, not elsewhere classified: Secondary | ICD-10-CM | POA: Diagnosis not present

## 2013-11-22 DIAGNOSIS — M1710 Unilateral primary osteoarthritis, unspecified knee: Secondary | ICD-10-CM | POA: Diagnosis not present

## 2013-11-23 DIAGNOSIS — Z433 Encounter for attention to colostomy: Secondary | ICD-10-CM | POA: Diagnosis not present

## 2013-11-23 DIAGNOSIS — I10 Essential (primary) hypertension: Secondary | ICD-10-CM | POA: Diagnosis not present

## 2013-11-23 DIAGNOSIS — M1710 Unilateral primary osteoarthritis, unspecified knee: Secondary | ICD-10-CM | POA: Diagnosis not present

## 2013-11-23 DIAGNOSIS — F0390 Unspecified dementia without behavioral disturbance: Secondary | ICD-10-CM | POA: Diagnosis not present

## 2013-11-23 DIAGNOSIS — R262 Difficulty in walking, not elsewhere classified: Secondary | ICD-10-CM | POA: Diagnosis not present

## 2013-11-23 DIAGNOSIS — Q71899 Other reduction defects of unspecified upper limb: Secondary | ICD-10-CM | POA: Diagnosis not present

## 2013-11-25 DIAGNOSIS — Z433 Encounter for attention to colostomy: Secondary | ICD-10-CM | POA: Diagnosis not present

## 2013-11-25 DIAGNOSIS — I10 Essential (primary) hypertension: Secondary | ICD-10-CM | POA: Diagnosis not present

## 2013-11-25 DIAGNOSIS — Q71899 Other reduction defects of unspecified upper limb: Secondary | ICD-10-CM | POA: Diagnosis not present

## 2013-11-25 DIAGNOSIS — F0390 Unspecified dementia without behavioral disturbance: Secondary | ICD-10-CM | POA: Diagnosis not present

## 2013-11-25 DIAGNOSIS — M1710 Unilateral primary osteoarthritis, unspecified knee: Secondary | ICD-10-CM | POA: Diagnosis not present

## 2013-11-25 DIAGNOSIS — R262 Difficulty in walking, not elsewhere classified: Secondary | ICD-10-CM | POA: Diagnosis not present

## 2013-11-26 DIAGNOSIS — Q71899 Other reduction defects of unspecified upper limb: Secondary | ICD-10-CM | POA: Diagnosis not present

## 2013-11-26 DIAGNOSIS — I10 Essential (primary) hypertension: Secondary | ICD-10-CM | POA: Diagnosis not present

## 2013-11-26 DIAGNOSIS — Z433 Encounter for attention to colostomy: Secondary | ICD-10-CM | POA: Diagnosis not present

## 2013-11-26 DIAGNOSIS — R262 Difficulty in walking, not elsewhere classified: Secondary | ICD-10-CM | POA: Diagnosis not present

## 2013-11-26 DIAGNOSIS — M1710 Unilateral primary osteoarthritis, unspecified knee: Secondary | ICD-10-CM | POA: Diagnosis not present

## 2013-11-26 DIAGNOSIS — F0390 Unspecified dementia without behavioral disturbance: Secondary | ICD-10-CM | POA: Diagnosis not present

## 2013-11-27 DIAGNOSIS — I10 Essential (primary) hypertension: Secondary | ICD-10-CM | POA: Diagnosis not present

## 2013-11-27 DIAGNOSIS — M1710 Unilateral primary osteoarthritis, unspecified knee: Secondary | ICD-10-CM | POA: Diagnosis not present

## 2013-11-27 DIAGNOSIS — Q71899 Other reduction defects of unspecified upper limb: Secondary | ICD-10-CM | POA: Diagnosis not present

## 2013-11-27 DIAGNOSIS — F0390 Unspecified dementia without behavioral disturbance: Secondary | ICD-10-CM | POA: Diagnosis not present

## 2013-11-27 DIAGNOSIS — Z433 Encounter for attention to colostomy: Secondary | ICD-10-CM | POA: Diagnosis not present

## 2013-11-27 DIAGNOSIS — R262 Difficulty in walking, not elsewhere classified: Secondary | ICD-10-CM | POA: Diagnosis not present

## 2013-11-29 DIAGNOSIS — F0390 Unspecified dementia without behavioral disturbance: Secondary | ICD-10-CM | POA: Diagnosis not present

## 2013-11-29 DIAGNOSIS — I10 Essential (primary) hypertension: Secondary | ICD-10-CM | POA: Diagnosis not present

## 2013-11-29 DIAGNOSIS — Z433 Encounter for attention to colostomy: Secondary | ICD-10-CM | POA: Diagnosis not present

## 2013-11-29 DIAGNOSIS — Q71899 Other reduction defects of unspecified upper limb: Secondary | ICD-10-CM | POA: Diagnosis not present

## 2013-11-29 DIAGNOSIS — R262 Difficulty in walking, not elsewhere classified: Secondary | ICD-10-CM | POA: Diagnosis not present

## 2013-11-29 DIAGNOSIS — M1710 Unilateral primary osteoarthritis, unspecified knee: Secondary | ICD-10-CM | POA: Diagnosis not present

## 2013-12-02 DIAGNOSIS — Q71899 Other reduction defects of unspecified upper limb: Secondary | ICD-10-CM | POA: Diagnosis not present

## 2013-12-02 DIAGNOSIS — R262 Difficulty in walking, not elsewhere classified: Secondary | ICD-10-CM | POA: Diagnosis not present

## 2013-12-02 DIAGNOSIS — F0390 Unspecified dementia without behavioral disturbance: Secondary | ICD-10-CM | POA: Diagnosis not present

## 2013-12-02 DIAGNOSIS — M1710 Unilateral primary osteoarthritis, unspecified knee: Secondary | ICD-10-CM | POA: Diagnosis not present

## 2013-12-02 DIAGNOSIS — Z433 Encounter for attention to colostomy: Secondary | ICD-10-CM | POA: Diagnosis not present

## 2013-12-02 DIAGNOSIS — I10 Essential (primary) hypertension: Secondary | ICD-10-CM | POA: Diagnosis not present

## 2013-12-03 ENCOUNTER — Encounter (INDEPENDENT_AMBULATORY_CARE_PROVIDER_SITE_OTHER): Payer: Self-pay

## 2013-12-03 ENCOUNTER — Telehealth (INDEPENDENT_AMBULATORY_CARE_PROVIDER_SITE_OTHER): Payer: Self-pay

## 2013-12-03 DIAGNOSIS — Z433 Encounter for attention to colostomy: Secondary | ICD-10-CM | POA: Diagnosis not present

## 2013-12-03 DIAGNOSIS — F0390 Unspecified dementia without behavioral disturbance: Secondary | ICD-10-CM | POA: Diagnosis not present

## 2013-12-03 DIAGNOSIS — I10 Essential (primary) hypertension: Secondary | ICD-10-CM | POA: Diagnosis not present

## 2013-12-03 DIAGNOSIS — M1710 Unilateral primary osteoarthritis, unspecified knee: Secondary | ICD-10-CM | POA: Diagnosis not present

## 2013-12-03 DIAGNOSIS — R262 Difficulty in walking, not elsewhere classified: Secondary | ICD-10-CM | POA: Diagnosis not present

## 2013-12-03 DIAGNOSIS — Q71899 Other reduction defects of unspecified upper limb: Secondary | ICD-10-CM | POA: Diagnosis not present

## 2013-12-03 NOTE — Telephone Encounter (Signed)
LM to return call regarding patient's records.  Pls ask for [name] [name].

## 2013-12-04 DIAGNOSIS — M1710 Unilateral primary osteoarthritis, unspecified knee: Secondary | ICD-10-CM | POA: Diagnosis not present

## 2013-12-04 DIAGNOSIS — R262 Difficulty in walking, not elsewhere classified: Secondary | ICD-10-CM | POA: Diagnosis not present

## 2013-12-04 DIAGNOSIS — I10 Essential (primary) hypertension: Secondary | ICD-10-CM | POA: Diagnosis not present

## 2013-12-04 DIAGNOSIS — F0390 Unspecified dementia without behavioral disturbance: Secondary | ICD-10-CM | POA: Diagnosis not present

## 2013-12-04 DIAGNOSIS — Z433 Encounter for attention to colostomy: Secondary | ICD-10-CM | POA: Diagnosis not present

## 2013-12-04 DIAGNOSIS — Q71899 Other reduction defects of unspecified upper limb: Secondary | ICD-10-CM | POA: Diagnosis not present

## 2013-12-06 DIAGNOSIS — I10 Essential (primary) hypertension: Secondary | ICD-10-CM | POA: Diagnosis not present

## 2013-12-06 DIAGNOSIS — R262 Difficulty in walking, not elsewhere classified: Secondary | ICD-10-CM | POA: Diagnosis not present

## 2013-12-06 DIAGNOSIS — Z433 Encounter for attention to colostomy: Secondary | ICD-10-CM | POA: Diagnosis not present

## 2013-12-06 DIAGNOSIS — F0390 Unspecified dementia without behavioral disturbance: Secondary | ICD-10-CM | POA: Diagnosis not present

## 2013-12-06 DIAGNOSIS — M1710 Unilateral primary osteoarthritis, unspecified knee: Secondary | ICD-10-CM | POA: Diagnosis not present

## 2013-12-06 DIAGNOSIS — Q71899 Other reduction defects of unspecified upper limb: Secondary | ICD-10-CM | POA: Diagnosis not present

## 2013-12-09 DIAGNOSIS — F0390 Unspecified dementia without behavioral disturbance: Secondary | ICD-10-CM | POA: Diagnosis not present

## 2013-12-09 DIAGNOSIS — R262 Difficulty in walking, not elsewhere classified: Secondary | ICD-10-CM | POA: Diagnosis not present

## 2013-12-09 DIAGNOSIS — M1710 Unilateral primary osteoarthritis, unspecified knee: Secondary | ICD-10-CM | POA: Diagnosis not present

## 2013-12-09 DIAGNOSIS — Z433 Encounter for attention to colostomy: Secondary | ICD-10-CM | POA: Diagnosis not present

## 2013-12-09 DIAGNOSIS — I10 Essential (primary) hypertension: Secondary | ICD-10-CM | POA: Diagnosis not present

## 2013-12-09 DIAGNOSIS — Q71899 Other reduction defects of unspecified upper limb: Secondary | ICD-10-CM | POA: Diagnosis not present

## 2013-12-10 ENCOUNTER — Ambulatory Visit (INDEPENDENT_AMBULATORY_CARE_PROVIDER_SITE_OTHER): Payer: Medicare Other | Admitting: General Surgery

## 2013-12-10 ENCOUNTER — Encounter (INDEPENDENT_AMBULATORY_CARE_PROVIDER_SITE_OTHER): Payer: Self-pay

## 2013-12-10 ENCOUNTER — Encounter (INDEPENDENT_AMBULATORY_CARE_PROVIDER_SITE_OTHER): Payer: Self-pay | Admitting: General Surgery

## 2013-12-10 VITALS — BP 114/76 | HR 72 | Temp 97.6°F | Resp 16 | Ht 66.0 in | Wt 152.6 lb

## 2013-12-10 DIAGNOSIS — Z933 Colostomy status: Secondary | ICD-10-CM | POA: Diagnosis not present

## 2013-12-10 NOTE — Patient Instructions (Addendum)
I do not recommend having the colostomy reversed.  In my opinion, the risks outweigh the benefits.
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
## 2013-12-11 DIAGNOSIS — Z433 Encounter for attention to colostomy: Secondary | ICD-10-CM | POA: Diagnosis not present

## 2013-12-11 DIAGNOSIS — Q71899 Other reduction defects of unspecified upper limb: Secondary | ICD-10-CM | POA: Diagnosis not present

## 2013-12-11 DIAGNOSIS — I10 Essential (primary) hypertension: Secondary | ICD-10-CM | POA: Diagnosis not present

## 2013-12-11 DIAGNOSIS — M1710 Unilateral primary osteoarthritis, unspecified knee: Secondary | ICD-10-CM | POA: Diagnosis not present

## 2013-12-11 DIAGNOSIS — R262 Difficulty in walking, not elsewhere classified: Secondary | ICD-10-CM | POA: Diagnosis not present

## 2013-12-11 DIAGNOSIS — F0390 Unspecified dementia without behavioral disturbance: Secondary | ICD-10-CM | POA: Diagnosis not present

## 2013-12-12 DIAGNOSIS — F0390 Unspecified dementia without behavioral disturbance: Secondary | ICD-10-CM | POA: Diagnosis not present

## 2013-12-12 DIAGNOSIS — M1710 Unilateral primary osteoarthritis, unspecified knee: Secondary | ICD-10-CM | POA: Diagnosis not present

## 2013-12-12 DIAGNOSIS — I10 Essential (primary) hypertension: Secondary | ICD-10-CM | POA: Diagnosis not present

## 2013-12-12 DIAGNOSIS — R262 Difficulty in walking, not elsewhere classified: Secondary | ICD-10-CM | POA: Diagnosis not present

## 2013-12-12 DIAGNOSIS — Z433 Encounter for attention to colostomy: Secondary | ICD-10-CM | POA: Diagnosis not present

## 2013-12-12 DIAGNOSIS — Q71899 Other reduction defects of unspecified upper limb: Secondary | ICD-10-CM | POA: Diagnosis not present

## 2013-12-17 DIAGNOSIS — M1710 Unilateral primary osteoarthritis, unspecified knee: Secondary | ICD-10-CM | POA: Diagnosis not present

## 2013-12-17 DIAGNOSIS — R262 Difficulty in walking, not elsewhere classified: Secondary | ICD-10-CM | POA: Diagnosis not present

## 2013-12-17 DIAGNOSIS — Z433 Encounter for attention to colostomy: Secondary | ICD-10-CM | POA: Diagnosis not present

## 2013-12-17 DIAGNOSIS — F0390 Unspecified dementia without behavioral disturbance: Secondary | ICD-10-CM | POA: Diagnosis not present

## 2013-12-17 DIAGNOSIS — Q71899 Other reduction defects of unspecified upper limb: Secondary | ICD-10-CM | POA: Diagnosis not present

## 2013-12-17 DIAGNOSIS — I10 Essential (primary) hypertension: Secondary | ICD-10-CM | POA: Diagnosis not present

## 2013-12-20 DIAGNOSIS — Z933 Colostomy status: Secondary | ICD-10-CM | POA: Diagnosis not present

## 2013-12-20 DIAGNOSIS — M25569 Pain in unspecified knee: Secondary | ICD-10-CM | POA: Diagnosis not present

## 2013-12-20 DIAGNOSIS — R262 Difficulty in walking, not elsewhere classified: Secondary | ICD-10-CM | POA: Diagnosis not present

## 2013-12-20 DIAGNOSIS — R413 Other amnesia: Secondary | ICD-10-CM | POA: Diagnosis not present

## 2013-12-20 DIAGNOSIS — R279 Unspecified lack of coordination: Secondary | ICD-10-CM | POA: Diagnosis not present

## 2013-12-20 DIAGNOSIS — M1710 Unilateral primary osteoarthritis, unspecified knee: Secondary | ICD-10-CM | POA: Diagnosis not present

## 2013-12-23 DIAGNOSIS — M1710 Unilateral primary osteoarthritis, unspecified knee: Secondary | ICD-10-CM | POA: Diagnosis not present

## 2013-12-23 DIAGNOSIS — M25569 Pain in unspecified knee: Secondary | ICD-10-CM | POA: Diagnosis not present

## 2013-12-23 DIAGNOSIS — R279 Unspecified lack of coordination: Secondary | ICD-10-CM | POA: Diagnosis not present

## 2013-12-23 DIAGNOSIS — R262 Difficulty in walking, not elsewhere classified: Secondary | ICD-10-CM | POA: Diagnosis not present

## 2013-12-24 DIAGNOSIS — R262 Difficulty in walking, not elsewhere classified: Secondary | ICD-10-CM | POA: Diagnosis not present

## 2013-12-24 DIAGNOSIS — M25569 Pain in unspecified knee: Secondary | ICD-10-CM | POA: Diagnosis not present

## 2013-12-24 DIAGNOSIS — R279 Unspecified lack of coordination: Secondary | ICD-10-CM | POA: Diagnosis not present

## 2013-12-24 DIAGNOSIS — M1710 Unilateral primary osteoarthritis, unspecified knee: Secondary | ICD-10-CM | POA: Diagnosis not present

## 2013-12-25 DIAGNOSIS — M1710 Unilateral primary osteoarthritis, unspecified knee: Secondary | ICD-10-CM | POA: Diagnosis not present

## 2013-12-25 DIAGNOSIS — R279 Unspecified lack of coordination: Secondary | ICD-10-CM | POA: Diagnosis not present

## 2013-12-25 DIAGNOSIS — M25569 Pain in unspecified knee: Secondary | ICD-10-CM | POA: Diagnosis not present

## 2013-12-25 DIAGNOSIS — R262 Difficulty in walking, not elsewhere classified: Secondary | ICD-10-CM | POA: Diagnosis not present

## 2013-12-26 DIAGNOSIS — M1710 Unilateral primary osteoarthritis, unspecified knee: Secondary | ICD-10-CM | POA: Diagnosis not present

## 2013-12-26 DIAGNOSIS — M25569 Pain in unspecified knee: Secondary | ICD-10-CM | POA: Diagnosis not present

## 2013-12-26 DIAGNOSIS — R279 Unspecified lack of coordination: Secondary | ICD-10-CM | POA: Diagnosis not present

## 2013-12-26 DIAGNOSIS — R262 Difficulty in walking, not elsewhere classified: Secondary | ICD-10-CM | POA: Diagnosis not present

## 2013-12-27 DIAGNOSIS — R262 Difficulty in walking, not elsewhere classified: Secondary | ICD-10-CM | POA: Diagnosis not present

## 2013-12-27 DIAGNOSIS — M1710 Unilateral primary osteoarthritis, unspecified knee: Secondary | ICD-10-CM | POA: Diagnosis not present

## 2013-12-27 DIAGNOSIS — M25569 Pain in unspecified knee: Secondary | ICD-10-CM | POA: Diagnosis not present

## 2013-12-27 DIAGNOSIS — R279 Unspecified lack of coordination: Secondary | ICD-10-CM | POA: Diagnosis not present

## 2013-12-30 DIAGNOSIS — R279 Unspecified lack of coordination: Secondary | ICD-10-CM | POA: Diagnosis not present

## 2013-12-30 DIAGNOSIS — M25569 Pain in unspecified knee: Secondary | ICD-10-CM | POA: Diagnosis not present

## 2013-12-30 DIAGNOSIS — R262 Difficulty in walking, not elsewhere classified: Secondary | ICD-10-CM | POA: Diagnosis not present

## 2013-12-30 DIAGNOSIS — M1710 Unilateral primary osteoarthritis, unspecified knee: Secondary | ICD-10-CM | POA: Diagnosis not present

## 2013-12-31 DIAGNOSIS — H2510 Age-related nuclear cataract, unspecified eye: Secondary | ICD-10-CM | POA: Diagnosis not present

## 2013-12-31 DIAGNOSIS — H52209 Unspecified astigmatism, unspecified eye: Secondary | ICD-10-CM | POA: Diagnosis not present

## 2013-12-31 DIAGNOSIS — Z961 Presence of intraocular lens: Secondary | ICD-10-CM | POA: Diagnosis not present

## 2013-12-31 DIAGNOSIS — H179 Unspecified corneal scar and opacity: Secondary | ICD-10-CM | POA: Diagnosis not present

## 2014-01-01 DIAGNOSIS — M25569 Pain in unspecified knee: Secondary | ICD-10-CM | POA: Diagnosis not present

## 2014-01-01 DIAGNOSIS — R279 Unspecified lack of coordination: Secondary | ICD-10-CM | POA: Diagnosis not present

## 2014-01-01 DIAGNOSIS — M1710 Unilateral primary osteoarthritis, unspecified knee: Secondary | ICD-10-CM | POA: Diagnosis not present

## 2014-01-01 DIAGNOSIS — R262 Difficulty in walking, not elsewhere classified: Secondary | ICD-10-CM | POA: Diagnosis not present

## 2014-01-14 ENCOUNTER — Encounter (INDEPENDENT_AMBULATORY_CARE_PROVIDER_SITE_OTHER): Payer: Self-pay

## 2014-01-23 DIAGNOSIS — M25559 Pain in unspecified hip: Secondary | ICD-10-CM | POA: Diagnosis not present

## 2014-01-24 ENCOUNTER — Other Ambulatory Visit: Payer: Self-pay | Admitting: Geriatric Medicine

## 2014-01-24 ENCOUNTER — Ambulatory Visit
Admission: RE | Admit: 2014-01-24 | Discharge: 2014-01-24 | Disposition: A | Discharge status: Home / Self Care | Payer: Medicare Other | Source: Ambulatory Visit | Attending: Geriatric Medicine | Admitting: Geriatric Medicine

## 2014-01-24 DIAGNOSIS — S329XXA Fracture of unspecified parts of lumbosacral spine and pelvis, initial encounter for closed fracture: Secondary | ICD-10-CM

## 2014-01-24 DIAGNOSIS — D649 Anemia, unspecified: Secondary | ICD-10-CM | POA: Diagnosis not present

## 2014-01-24 DIAGNOSIS — N949 Unspecified condition associated with female genital organs and menstrual cycle: Secondary | ICD-10-CM | POA: Diagnosis not present

## 2014-01-24 DIAGNOSIS — E039 Hypothyroidism, unspecified: Secondary | ICD-10-CM | POA: Diagnosis not present

## 2014-01-24 DIAGNOSIS — I10 Essential (primary) hypertension: Secondary | ICD-10-CM | POA: Diagnosis not present

## 2014-01-31 DIAGNOSIS — M25569 Pain in unspecified knee: Secondary | ICD-10-CM | POA: Diagnosis not present

## 2014-01-31 DIAGNOSIS — R279 Unspecified lack of coordination: Secondary | ICD-10-CM | POA: Diagnosis not present

## 2014-01-31 DIAGNOSIS — M1710 Unilateral primary osteoarthritis, unspecified knee: Secondary | ICD-10-CM | POA: Diagnosis not present

## 2014-01-31 DIAGNOSIS — R262 Difficulty in walking, not elsewhere classified: Secondary | ICD-10-CM | POA: Diagnosis not present

## 2014-02-03 DIAGNOSIS — R279 Unspecified lack of coordination: Secondary | ICD-10-CM | POA: Diagnosis not present

## 2014-02-03 DIAGNOSIS — M25569 Pain in unspecified knee: Secondary | ICD-10-CM | POA: Diagnosis not present

## 2014-02-03 DIAGNOSIS — M1710 Unilateral primary osteoarthritis, unspecified knee: Secondary | ICD-10-CM | POA: Diagnosis not present

## 2014-02-03 DIAGNOSIS — R262 Difficulty in walking, not elsewhere classified: Secondary | ICD-10-CM | POA: Diagnosis not present

## 2014-02-04 DIAGNOSIS — R279 Unspecified lack of coordination: Secondary | ICD-10-CM | POA: Diagnosis not present

## 2014-02-04 DIAGNOSIS — M1710 Unilateral primary osteoarthritis, unspecified knee: Secondary | ICD-10-CM | POA: Diagnosis not present

## 2014-02-04 DIAGNOSIS — R262 Difficulty in walking, not elsewhere classified: Secondary | ICD-10-CM | POA: Diagnosis not present

## 2014-02-04 DIAGNOSIS — M25569 Pain in unspecified knee: Secondary | ICD-10-CM | POA: Diagnosis not present

## 2014-02-05 DIAGNOSIS — M1710 Unilateral primary osteoarthritis, unspecified knee: Secondary | ICD-10-CM | POA: Diagnosis not present

## 2014-02-05 DIAGNOSIS — R262 Difficulty in walking, not elsewhere classified: Secondary | ICD-10-CM | POA: Diagnosis not present

## 2014-02-05 DIAGNOSIS — M25569 Pain in unspecified knee: Secondary | ICD-10-CM | POA: Diagnosis not present

## 2014-02-05 DIAGNOSIS — R279 Unspecified lack of coordination: Secondary | ICD-10-CM | POA: Diagnosis not present

## 2014-02-07 DIAGNOSIS — R279 Unspecified lack of coordination: Secondary | ICD-10-CM | POA: Diagnosis not present

## 2014-02-07 DIAGNOSIS — R262 Difficulty in walking, not elsewhere classified: Secondary | ICD-10-CM | POA: Diagnosis not present

## 2014-02-07 DIAGNOSIS — M1710 Unilateral primary osteoarthritis, unspecified knee: Secondary | ICD-10-CM | POA: Diagnosis not present

## 2014-02-07 DIAGNOSIS — M25569 Pain in unspecified knee: Secondary | ICD-10-CM | POA: Diagnosis not present

## 2014-02-10 DIAGNOSIS — M25569 Pain in unspecified knee: Secondary | ICD-10-CM | POA: Diagnosis not present

## 2014-02-10 DIAGNOSIS — R262 Difficulty in walking, not elsewhere classified: Secondary | ICD-10-CM | POA: Diagnosis not present

## 2014-02-10 DIAGNOSIS — R279 Unspecified lack of coordination: Secondary | ICD-10-CM | POA: Diagnosis not present

## 2014-02-10 DIAGNOSIS — M1710 Unilateral primary osteoarthritis, unspecified knee: Secondary | ICD-10-CM | POA: Diagnosis not present

## 2014-02-11 DIAGNOSIS — M1710 Unilateral primary osteoarthritis, unspecified knee: Secondary | ICD-10-CM | POA: Diagnosis not present

## 2014-02-11 DIAGNOSIS — R262 Difficulty in walking, not elsewhere classified: Secondary | ICD-10-CM | POA: Diagnosis not present

## 2014-02-11 DIAGNOSIS — R279 Unspecified lack of coordination: Secondary | ICD-10-CM | POA: Diagnosis not present

## 2014-02-11 DIAGNOSIS — M25569 Pain in unspecified knee: Secondary | ICD-10-CM | POA: Diagnosis not present

## 2014-02-12 DIAGNOSIS — M1710 Unilateral primary osteoarthritis, unspecified knee: Secondary | ICD-10-CM | POA: Diagnosis not present

## 2014-02-12 DIAGNOSIS — R279 Unspecified lack of coordination: Secondary | ICD-10-CM | POA: Diagnosis not present

## 2014-02-12 DIAGNOSIS — M25569 Pain in unspecified knee: Secondary | ICD-10-CM | POA: Diagnosis not present

## 2014-02-12 DIAGNOSIS — R262 Difficulty in walking, not elsewhere classified: Secondary | ICD-10-CM | POA: Diagnosis not present

## 2014-02-13 DIAGNOSIS — R262 Difficulty in walking, not elsewhere classified: Secondary | ICD-10-CM | POA: Diagnosis not present

## 2014-02-13 DIAGNOSIS — M1710 Unilateral primary osteoarthritis, unspecified knee: Secondary | ICD-10-CM | POA: Diagnosis not present

## 2014-02-13 DIAGNOSIS — R279 Unspecified lack of coordination: Secondary | ICD-10-CM | POA: Diagnosis not present

## 2014-02-13 DIAGNOSIS — M25569 Pain in unspecified knee: Secondary | ICD-10-CM | POA: Diagnosis not present

## 2014-02-17 DIAGNOSIS — M1710 Unilateral primary osteoarthritis, unspecified knee: Secondary | ICD-10-CM | POA: Diagnosis not present

## 2014-02-17 DIAGNOSIS — M25569 Pain in unspecified knee: Secondary | ICD-10-CM | POA: Diagnosis not present

## 2014-02-17 DIAGNOSIS — R262 Difficulty in walking, not elsewhere classified: Secondary | ICD-10-CM | POA: Diagnosis not present

## 2014-02-17 DIAGNOSIS — R279 Unspecified lack of coordination: Secondary | ICD-10-CM | POA: Diagnosis not present

## 2014-02-19 DIAGNOSIS — M25569 Pain in unspecified knee: Secondary | ICD-10-CM | POA: Diagnosis not present

## 2014-02-19 DIAGNOSIS — M1710 Unilateral primary osteoarthritis, unspecified knee: Secondary | ICD-10-CM | POA: Diagnosis not present

## 2014-02-19 DIAGNOSIS — R279 Unspecified lack of coordination: Secondary | ICD-10-CM | POA: Diagnosis not present

## 2014-02-19 DIAGNOSIS — R262 Difficulty in walking, not elsewhere classified: Secondary | ICD-10-CM | POA: Diagnosis not present

## 2014-02-20 DIAGNOSIS — R197 Diarrhea, unspecified: Secondary | ICD-10-CM | POA: Diagnosis not present

## 2014-02-20 DIAGNOSIS — R110 Nausea: Secondary | ICD-10-CM | POA: Diagnosis not present

## 2014-02-21 DIAGNOSIS — M1710 Unilateral primary osteoarthritis, unspecified knee: Secondary | ICD-10-CM | POA: Diagnosis not present

## 2014-02-21 DIAGNOSIS — M25569 Pain in unspecified knee: Secondary | ICD-10-CM | POA: Diagnosis not present

## 2014-02-21 DIAGNOSIS — R262 Difficulty in walking, not elsewhere classified: Secondary | ICD-10-CM | POA: Diagnosis not present

## 2014-02-21 DIAGNOSIS — R279 Unspecified lack of coordination: Secondary | ICD-10-CM | POA: Diagnosis not present

## 2014-02-25 DIAGNOSIS — R262 Difficulty in walking, not elsewhere classified: Secondary | ICD-10-CM | POA: Diagnosis not present

## 2014-02-25 DIAGNOSIS — M1710 Unilateral primary osteoarthritis, unspecified knee: Secondary | ICD-10-CM | POA: Diagnosis not present

## 2014-02-25 DIAGNOSIS — R279 Unspecified lack of coordination: Secondary | ICD-10-CM | POA: Diagnosis not present

## 2014-02-25 DIAGNOSIS — M25569 Pain in unspecified knee: Secondary | ICD-10-CM | POA: Diagnosis not present

## 2014-02-26 DIAGNOSIS — M25569 Pain in unspecified knee: Secondary | ICD-10-CM | POA: Diagnosis not present

## 2014-02-26 DIAGNOSIS — M1710 Unilateral primary osteoarthritis, unspecified knee: Secondary | ICD-10-CM | POA: Diagnosis not present

## 2014-02-26 DIAGNOSIS — R262 Difficulty in walking, not elsewhere classified: Secondary | ICD-10-CM | POA: Diagnosis not present

## 2014-02-26 DIAGNOSIS — R279 Unspecified lack of coordination: Secondary | ICD-10-CM | POA: Diagnosis not present

## 2014-02-27 DIAGNOSIS — R262 Difficulty in walking, not elsewhere classified: Secondary | ICD-10-CM | POA: Diagnosis not present

## 2014-02-27 DIAGNOSIS — R279 Unspecified lack of coordination: Secondary | ICD-10-CM | POA: Diagnosis not present

## 2014-02-27 DIAGNOSIS — M1710 Unilateral primary osteoarthritis, unspecified knee: Secondary | ICD-10-CM | POA: Diagnosis not present

## 2014-02-27 DIAGNOSIS — M25569 Pain in unspecified knee: Secondary | ICD-10-CM | POA: Diagnosis not present

## 2014-03-03 DIAGNOSIS — R279 Unspecified lack of coordination: Secondary | ICD-10-CM | POA: Diagnosis not present

## 2014-03-03 DIAGNOSIS — M25569 Pain in unspecified knee: Secondary | ICD-10-CM | POA: Diagnosis not present

## 2014-03-03 DIAGNOSIS — M1710 Unilateral primary osteoarthritis, unspecified knee: Secondary | ICD-10-CM | POA: Diagnosis not present

## 2014-03-03 DIAGNOSIS — R262 Difficulty in walking, not elsewhere classified: Secondary | ICD-10-CM | POA: Diagnosis not present

## 2014-03-04 DIAGNOSIS — M1710 Unilateral primary osteoarthritis, unspecified knee: Secondary | ICD-10-CM | POA: Diagnosis not present

## 2014-03-04 DIAGNOSIS — M25569 Pain in unspecified knee: Secondary | ICD-10-CM | POA: Diagnosis not present

## 2014-03-04 DIAGNOSIS — R262 Difficulty in walking, not elsewhere classified: Secondary | ICD-10-CM | POA: Diagnosis not present

## 2014-03-04 DIAGNOSIS — R279 Unspecified lack of coordination: Secondary | ICD-10-CM | POA: Diagnosis not present

## 2014-03-06 DIAGNOSIS — M1710 Unilateral primary osteoarthritis, unspecified knee: Secondary | ICD-10-CM | POA: Diagnosis not present

## 2014-03-06 DIAGNOSIS — M25569 Pain in unspecified knee: Secondary | ICD-10-CM | POA: Diagnosis not present

## 2014-03-06 DIAGNOSIS — R262 Difficulty in walking, not elsewhere classified: Secondary | ICD-10-CM | POA: Diagnosis not present

## 2014-03-06 DIAGNOSIS — R279 Unspecified lack of coordination: Secondary | ICD-10-CM | POA: Diagnosis not present

## 2014-03-10 DIAGNOSIS — M25569 Pain in unspecified knee: Secondary | ICD-10-CM | POA: Diagnosis not present

## 2014-03-10 DIAGNOSIS — R279 Unspecified lack of coordination: Secondary | ICD-10-CM | POA: Diagnosis not present

## 2014-03-10 DIAGNOSIS — R262 Difficulty in walking, not elsewhere classified: Secondary | ICD-10-CM | POA: Diagnosis not present

## 2014-03-10 DIAGNOSIS — M1710 Unilateral primary osteoarthritis, unspecified knee: Secondary | ICD-10-CM | POA: Diagnosis not present

## 2014-03-11 DIAGNOSIS — R279 Unspecified lack of coordination: Secondary | ICD-10-CM | POA: Diagnosis not present

## 2014-03-11 DIAGNOSIS — R262 Difficulty in walking, not elsewhere classified: Secondary | ICD-10-CM | POA: Diagnosis not present

## 2014-03-11 DIAGNOSIS — M25569 Pain in unspecified knee: Secondary | ICD-10-CM | POA: Diagnosis not present

## 2014-03-11 DIAGNOSIS — M1710 Unilateral primary osteoarthritis, unspecified knee: Secondary | ICD-10-CM | POA: Diagnosis not present

## 2014-03-13 DIAGNOSIS — M25569 Pain in unspecified knee: Secondary | ICD-10-CM | POA: Diagnosis not present

## 2014-03-13 DIAGNOSIS — R262 Difficulty in walking, not elsewhere classified: Secondary | ICD-10-CM | POA: Diagnosis not present

## 2014-03-13 DIAGNOSIS — M1710 Unilateral primary osteoarthritis, unspecified knee: Secondary | ICD-10-CM | POA: Diagnosis not present

## 2014-03-13 DIAGNOSIS — R279 Unspecified lack of coordination: Secondary | ICD-10-CM | POA: Diagnosis not present

## 2014-03-18 DIAGNOSIS — M25569 Pain in unspecified knee: Secondary | ICD-10-CM | POA: Diagnosis not present

## 2014-03-18 DIAGNOSIS — M1710 Unilateral primary osteoarthritis, unspecified knee: Secondary | ICD-10-CM | POA: Diagnosis not present

## 2014-03-18 DIAGNOSIS — R279 Unspecified lack of coordination: Secondary | ICD-10-CM | POA: Diagnosis not present

## 2014-03-18 DIAGNOSIS — R262 Difficulty in walking, not elsewhere classified: Secondary | ICD-10-CM | POA: Diagnosis not present

## 2014-03-20 DIAGNOSIS — M1710 Unilateral primary osteoarthritis, unspecified knee: Secondary | ICD-10-CM | POA: Diagnosis not present

## 2014-03-20 DIAGNOSIS — R262 Difficulty in walking, not elsewhere classified: Secondary | ICD-10-CM | POA: Diagnosis not present

## 2014-03-20 DIAGNOSIS — R279 Unspecified lack of coordination: Secondary | ICD-10-CM | POA: Diagnosis not present

## 2014-03-20 DIAGNOSIS — M25569 Pain in unspecified knee: Secondary | ICD-10-CM | POA: Diagnosis not present

## 2014-03-24 DIAGNOSIS — R279 Unspecified lack of coordination: Secondary | ICD-10-CM | POA: Diagnosis not present

## 2014-03-24 DIAGNOSIS — M25569 Pain in unspecified knee: Secondary | ICD-10-CM | POA: Diagnosis not present

## 2014-03-24 DIAGNOSIS — M1710 Unilateral primary osteoarthritis, unspecified knee: Secondary | ICD-10-CM | POA: Diagnosis not present

## 2014-03-24 DIAGNOSIS — R262 Difficulty in walking, not elsewhere classified: Secondary | ICD-10-CM | POA: Diagnosis not present

## 2014-03-27 DIAGNOSIS — R262 Difficulty in walking, not elsewhere classified: Secondary | ICD-10-CM | POA: Diagnosis not present

## 2014-03-27 DIAGNOSIS — M1710 Unilateral primary osteoarthritis, unspecified knee: Secondary | ICD-10-CM | POA: Diagnosis not present

## 2014-03-27 DIAGNOSIS — M25569 Pain in unspecified knee: Secondary | ICD-10-CM | POA: Diagnosis not present

## 2014-03-27 DIAGNOSIS — R279 Unspecified lack of coordination: Secondary | ICD-10-CM | POA: Diagnosis not present

## 2014-04-02 DIAGNOSIS — R279 Unspecified lack of coordination: Secondary | ICD-10-CM | POA: Diagnosis not present

## 2014-04-02 DIAGNOSIS — M1710 Unilateral primary osteoarthritis, unspecified knee: Secondary | ICD-10-CM | POA: Diagnosis not present

## 2014-04-02 DIAGNOSIS — M25569 Pain in unspecified knee: Secondary | ICD-10-CM | POA: Diagnosis not present

## 2014-04-02 DIAGNOSIS — R262 Difficulty in walking, not elsewhere classified: Secondary | ICD-10-CM | POA: Diagnosis not present

## 2014-04-03 DIAGNOSIS — R279 Unspecified lack of coordination: Secondary | ICD-10-CM | POA: Diagnosis not present

## 2014-04-03 DIAGNOSIS — R262 Difficulty in walking, not elsewhere classified: Secondary | ICD-10-CM | POA: Diagnosis not present

## 2014-04-03 DIAGNOSIS — M1710 Unilateral primary osteoarthritis, unspecified knee: Secondary | ICD-10-CM | POA: Diagnosis not present

## 2014-04-03 DIAGNOSIS — M25569 Pain in unspecified knee: Secondary | ICD-10-CM | POA: Diagnosis not present

## 2014-04-06 HISTORY — PX: COLOSTOMY: SHX63

## 2014-04-07 DIAGNOSIS — R279 Unspecified lack of coordination: Secondary | ICD-10-CM | POA: Diagnosis not present

## 2014-04-07 DIAGNOSIS — R262 Difficulty in walking, not elsewhere classified: Secondary | ICD-10-CM | POA: Diagnosis not present

## 2014-04-07 DIAGNOSIS — M25569 Pain in unspecified knee: Secondary | ICD-10-CM | POA: Diagnosis not present

## 2014-04-07 DIAGNOSIS — M1710 Unilateral primary osteoarthritis, unspecified knee: Secondary | ICD-10-CM | POA: Diagnosis not present

## 2014-04-10 DIAGNOSIS — M25569 Pain in unspecified knee: Secondary | ICD-10-CM | POA: Diagnosis not present

## 2014-04-10 DIAGNOSIS — R279 Unspecified lack of coordination: Secondary | ICD-10-CM | POA: Diagnosis not present

## 2014-04-10 DIAGNOSIS — M1710 Unilateral primary osteoarthritis, unspecified knee: Secondary | ICD-10-CM | POA: Diagnosis not present

## 2014-04-10 DIAGNOSIS — R262 Difficulty in walking, not elsewhere classified: Secondary | ICD-10-CM | POA: Diagnosis not present

## 2014-04-23 DIAGNOSIS — M25569 Pain in unspecified knee: Secondary | ICD-10-CM | POA: Diagnosis not present

## 2014-04-23 DIAGNOSIS — M1710 Unilateral primary osteoarthritis, unspecified knee: Secondary | ICD-10-CM | POA: Diagnosis not present

## 2014-05-19 DIAGNOSIS — M25519 Pain in unspecified shoulder: Secondary | ICD-10-CM | POA: Diagnosis not present

## 2014-05-19 DIAGNOSIS — L2089 Other atopic dermatitis: Secondary | ICD-10-CM | POA: Diagnosis not present

## 2014-05-19 DIAGNOSIS — M25559 Pain in unspecified hip: Secondary | ICD-10-CM | POA: Diagnosis not present

## 2014-06-04 DIAGNOSIS — M1610 Unilateral primary osteoarthritis, unspecified hip: Secondary | ICD-10-CM | POA: Diagnosis not present

## 2014-06-04 DIAGNOSIS — M19019 Primary osteoarthritis, unspecified shoulder: Secondary | ICD-10-CM | POA: Diagnosis not present

## 2014-06-04 DIAGNOSIS — M25519 Pain in unspecified shoulder: Secondary | ICD-10-CM | POA: Diagnosis not present

## 2014-06-04 DIAGNOSIS — R109 Unspecified abdominal pain: Secondary | ICD-10-CM | POA: Diagnosis not present

## 2014-06-24 DIAGNOSIS — Z23 Encounter for immunization: Secondary | ICD-10-CM | POA: Diagnosis not present

## 2014-07-10 DIAGNOSIS — Z933 Colostomy status: Secondary | ICD-10-CM | POA: Diagnosis not present

## 2014-07-10 DIAGNOSIS — J069 Acute upper respiratory infection, unspecified: Secondary | ICD-10-CM | POA: Diagnosis not present

## 2014-09-16 DIAGNOSIS — E039 Hypothyroidism, unspecified: Secondary | ICD-10-CM | POA: Diagnosis not present

## 2014-09-16 DIAGNOSIS — Z79899 Other long term (current) drug therapy: Secondary | ICD-10-CM | POA: Diagnosis not present

## 2014-09-16 DIAGNOSIS — I129 Hypertensive chronic kidney disease with stage 1 through stage 4 chronic kidney disease, or unspecified chronic kidney disease: Secondary | ICD-10-CM | POA: Diagnosis not present

## 2014-09-16 DIAGNOSIS — Z1389 Encounter for screening for other disorder: Secondary | ICD-10-CM | POA: Diagnosis not present

## 2014-09-16 DIAGNOSIS — Z0000 Encounter for general adult medical examination without abnormal findings: Secondary | ICD-10-CM | POA: Diagnosis not present

## 2014-09-16 DIAGNOSIS — N183 Chronic kidney disease, stage 3 (moderate): Secondary | ICD-10-CM | POA: Diagnosis not present

## 2014-10-07 DIAGNOSIS — R05 Cough: Secondary | ICD-10-CM | POA: Diagnosis not present

## 2014-10-14 DIAGNOSIS — R296 Repeated falls: Secondary | ICD-10-CM | POA: Diagnosis not present

## 2014-10-14 DIAGNOSIS — R269 Unspecified abnormalities of gait and mobility: Secondary | ICD-10-CM | POA: Diagnosis not present

## 2014-10-14 DIAGNOSIS — M6281 Muscle weakness (generalized): Secondary | ICD-10-CM | POA: Diagnosis not present

## 2014-10-16 DIAGNOSIS — R296 Repeated falls: Secondary | ICD-10-CM | POA: Diagnosis not present

## 2014-10-16 DIAGNOSIS — M6281 Muscle weakness (generalized): Secondary | ICD-10-CM | POA: Diagnosis not present

## 2014-10-16 DIAGNOSIS — R269 Unspecified abnormalities of gait and mobility: Secondary | ICD-10-CM | POA: Diagnosis not present

## 2014-10-22 DIAGNOSIS — R296 Repeated falls: Secondary | ICD-10-CM | POA: Diagnosis not present

## 2014-10-22 DIAGNOSIS — R269 Unspecified abnormalities of gait and mobility: Secondary | ICD-10-CM | POA: Diagnosis not present

## 2014-10-22 DIAGNOSIS — M6281 Muscle weakness (generalized): Secondary | ICD-10-CM | POA: Diagnosis not present

## 2014-10-23 DIAGNOSIS — M6281 Muscle weakness (generalized): Secondary | ICD-10-CM | POA: Diagnosis not present

## 2014-10-23 DIAGNOSIS — R269 Unspecified abnormalities of gait and mobility: Secondary | ICD-10-CM | POA: Diagnosis not present

## 2014-10-23 DIAGNOSIS — R296 Repeated falls: Secondary | ICD-10-CM | POA: Diagnosis not present

## 2014-10-27 DIAGNOSIS — R05 Cough: Secondary | ICD-10-CM | POA: Diagnosis not present

## 2014-10-27 DIAGNOSIS — G4700 Insomnia, unspecified: Secondary | ICD-10-CM | POA: Diagnosis not present

## 2014-10-27 DIAGNOSIS — N183 Chronic kidney disease, stage 3 (moderate): Secondary | ICD-10-CM | POA: Diagnosis not present

## 2014-10-27 DIAGNOSIS — I129 Hypertensive chronic kidney disease with stage 1 through stage 4 chronic kidney disease, or unspecified chronic kidney disease: Secondary | ICD-10-CM | POA: Diagnosis not present

## 2014-10-28 DIAGNOSIS — M6281 Muscle weakness (generalized): Secondary | ICD-10-CM | POA: Diagnosis not present

## 2014-10-28 DIAGNOSIS — R296 Repeated falls: Secondary | ICD-10-CM | POA: Diagnosis not present

## 2014-10-28 DIAGNOSIS — R269 Unspecified abnormalities of gait and mobility: Secondary | ICD-10-CM | POA: Diagnosis not present

## 2014-10-29 DIAGNOSIS — R296 Repeated falls: Secondary | ICD-10-CM | POA: Diagnosis not present

## 2014-10-29 DIAGNOSIS — M6281 Muscle weakness (generalized): Secondary | ICD-10-CM | POA: Diagnosis not present

## 2014-10-29 DIAGNOSIS — R269 Unspecified abnormalities of gait and mobility: Secondary | ICD-10-CM | POA: Diagnosis not present

## 2014-10-30 DIAGNOSIS — R269 Unspecified abnormalities of gait and mobility: Secondary | ICD-10-CM | POA: Diagnosis not present

## 2014-10-30 DIAGNOSIS — R296 Repeated falls: Secondary | ICD-10-CM | POA: Diagnosis not present

## 2014-10-30 DIAGNOSIS — M6281 Muscle weakness (generalized): Secondary | ICD-10-CM | POA: Diagnosis not present

## 2014-10-31 DIAGNOSIS — M6281 Muscle weakness (generalized): Secondary | ICD-10-CM | POA: Diagnosis not present

## 2014-10-31 DIAGNOSIS — R269 Unspecified abnormalities of gait and mobility: Secondary | ICD-10-CM | POA: Diagnosis not present

## 2014-10-31 DIAGNOSIS — R296 Repeated falls: Secondary | ICD-10-CM | POA: Diagnosis not present

## 2014-11-03 DIAGNOSIS — M6281 Muscle weakness (generalized): Secondary | ICD-10-CM | POA: Diagnosis not present

## 2014-11-03 DIAGNOSIS — R296 Repeated falls: Secondary | ICD-10-CM | POA: Diagnosis not present

## 2014-11-03 DIAGNOSIS — R269 Unspecified abnormalities of gait and mobility: Secondary | ICD-10-CM | POA: Diagnosis not present

## 2014-11-04 DIAGNOSIS — R296 Repeated falls: Secondary | ICD-10-CM | POA: Diagnosis not present

## 2014-11-04 DIAGNOSIS — M6281 Muscle weakness (generalized): Secondary | ICD-10-CM | POA: Diagnosis not present

## 2014-11-04 DIAGNOSIS — R269 Unspecified abnormalities of gait and mobility: Secondary | ICD-10-CM | POA: Diagnosis not present

## 2014-11-05 DIAGNOSIS — M6281 Muscle weakness (generalized): Secondary | ICD-10-CM | POA: Diagnosis not present

## 2014-11-05 DIAGNOSIS — R296 Repeated falls: Secondary | ICD-10-CM | POA: Diagnosis not present

## 2014-11-05 DIAGNOSIS — R269 Unspecified abnormalities of gait and mobility: Secondary | ICD-10-CM | POA: Diagnosis not present

## 2014-11-06 DIAGNOSIS — R296 Repeated falls: Secondary | ICD-10-CM | POA: Diagnosis not present

## 2014-11-06 DIAGNOSIS — M6281 Muscle weakness (generalized): Secondary | ICD-10-CM | POA: Diagnosis not present

## 2014-11-06 DIAGNOSIS — R269 Unspecified abnormalities of gait and mobility: Secondary | ICD-10-CM | POA: Diagnosis not present

## 2014-11-10 DIAGNOSIS — M6281 Muscle weakness (generalized): Secondary | ICD-10-CM | POA: Diagnosis not present

## 2014-11-10 DIAGNOSIS — R269 Unspecified abnormalities of gait and mobility: Secondary | ICD-10-CM | POA: Diagnosis not present

## 2014-11-10 DIAGNOSIS — R296 Repeated falls: Secondary | ICD-10-CM | POA: Diagnosis not present

## 2014-11-11 DIAGNOSIS — R296 Repeated falls: Secondary | ICD-10-CM | POA: Diagnosis not present

## 2014-11-11 DIAGNOSIS — R269 Unspecified abnormalities of gait and mobility: Secondary | ICD-10-CM | POA: Diagnosis not present

## 2014-11-11 DIAGNOSIS — M6281 Muscle weakness (generalized): Secondary | ICD-10-CM | POA: Diagnosis not present

## 2014-11-12 DIAGNOSIS — R269 Unspecified abnormalities of gait and mobility: Secondary | ICD-10-CM | POA: Diagnosis not present

## 2014-11-12 DIAGNOSIS — R296 Repeated falls: Secondary | ICD-10-CM | POA: Diagnosis not present

## 2014-11-12 DIAGNOSIS — M6281 Muscle weakness (generalized): Secondary | ICD-10-CM | POA: Diagnosis not present

## 2014-11-13 DIAGNOSIS — R269 Unspecified abnormalities of gait and mobility: Secondary | ICD-10-CM | POA: Diagnosis not present

## 2014-11-13 DIAGNOSIS — M6281 Muscle weakness (generalized): Secondary | ICD-10-CM | POA: Diagnosis not present

## 2014-11-13 DIAGNOSIS — R296 Repeated falls: Secondary | ICD-10-CM | POA: Diagnosis not present

## 2014-11-14 DIAGNOSIS — R269 Unspecified abnormalities of gait and mobility: Secondary | ICD-10-CM | POA: Diagnosis not present

## 2014-11-14 DIAGNOSIS — R296 Repeated falls: Secondary | ICD-10-CM | POA: Diagnosis not present

## 2014-11-14 DIAGNOSIS — M6281 Muscle weakness (generalized): Secondary | ICD-10-CM | POA: Diagnosis not present

## 2014-11-17 DIAGNOSIS — R269 Unspecified abnormalities of gait and mobility: Secondary | ICD-10-CM | POA: Diagnosis not present

## 2014-11-17 DIAGNOSIS — R296 Repeated falls: Secondary | ICD-10-CM | POA: Diagnosis not present

## 2014-11-17 DIAGNOSIS — M6281 Muscle weakness (generalized): Secondary | ICD-10-CM | POA: Diagnosis not present

## 2014-11-19 DIAGNOSIS — M6281 Muscle weakness (generalized): Secondary | ICD-10-CM | POA: Diagnosis not present

## 2014-11-19 DIAGNOSIS — R269 Unspecified abnormalities of gait and mobility: Secondary | ICD-10-CM | POA: Diagnosis not present

## 2014-11-19 DIAGNOSIS — R296 Repeated falls: Secondary | ICD-10-CM | POA: Diagnosis not present

## 2014-11-20 DIAGNOSIS — R296 Repeated falls: Secondary | ICD-10-CM | POA: Diagnosis not present

## 2014-11-20 DIAGNOSIS — M6281 Muscle weakness (generalized): Secondary | ICD-10-CM | POA: Diagnosis not present

## 2014-11-20 DIAGNOSIS — R269 Unspecified abnormalities of gait and mobility: Secondary | ICD-10-CM | POA: Diagnosis not present

## 2014-11-24 DIAGNOSIS — R269 Unspecified abnormalities of gait and mobility: Secondary | ICD-10-CM | POA: Diagnosis not present

## 2014-11-24 DIAGNOSIS — M6281 Muscle weakness (generalized): Secondary | ICD-10-CM | POA: Diagnosis not present

## 2014-11-24 DIAGNOSIS — R296 Repeated falls: Secondary | ICD-10-CM | POA: Diagnosis not present

## 2014-11-25 DIAGNOSIS — R269 Unspecified abnormalities of gait and mobility: Secondary | ICD-10-CM | POA: Diagnosis not present

## 2014-11-25 DIAGNOSIS — R296 Repeated falls: Secondary | ICD-10-CM | POA: Diagnosis not present

## 2014-11-25 DIAGNOSIS — M6281 Muscle weakness (generalized): Secondary | ICD-10-CM | POA: Diagnosis not present

## 2014-11-26 DIAGNOSIS — M6281 Muscle weakness (generalized): Secondary | ICD-10-CM | POA: Diagnosis not present

## 2014-11-26 DIAGNOSIS — R269 Unspecified abnormalities of gait and mobility: Secondary | ICD-10-CM | POA: Diagnosis not present

## 2014-11-26 DIAGNOSIS — R296 Repeated falls: Secondary | ICD-10-CM | POA: Diagnosis not present

## 2014-11-28 DIAGNOSIS — R296 Repeated falls: Secondary | ICD-10-CM | POA: Diagnosis not present

## 2014-11-28 DIAGNOSIS — M6281 Muscle weakness (generalized): Secondary | ICD-10-CM | POA: Diagnosis not present

## 2014-11-28 DIAGNOSIS — R269 Unspecified abnormalities of gait and mobility: Secondary | ICD-10-CM | POA: Diagnosis not present

## 2014-12-01 DIAGNOSIS — R269 Unspecified abnormalities of gait and mobility: Secondary | ICD-10-CM | POA: Diagnosis not present

## 2014-12-01 DIAGNOSIS — M6281 Muscle weakness (generalized): Secondary | ICD-10-CM | POA: Diagnosis not present

## 2014-12-01 DIAGNOSIS — R296 Repeated falls: Secondary | ICD-10-CM | POA: Diagnosis not present

## 2014-12-03 DIAGNOSIS — R269 Unspecified abnormalities of gait and mobility: Secondary | ICD-10-CM | POA: Diagnosis not present

## 2014-12-03 DIAGNOSIS — R296 Repeated falls: Secondary | ICD-10-CM | POA: Diagnosis not present

## 2014-12-03 DIAGNOSIS — M6281 Muscle weakness (generalized): Secondary | ICD-10-CM | POA: Diagnosis not present

## 2014-12-05 DIAGNOSIS — M6281 Muscle weakness (generalized): Secondary | ICD-10-CM | POA: Diagnosis not present

## 2014-12-05 DIAGNOSIS — R296 Repeated falls: Secondary | ICD-10-CM | POA: Diagnosis not present

## 2014-12-05 DIAGNOSIS — R269 Unspecified abnormalities of gait and mobility: Secondary | ICD-10-CM | POA: Diagnosis not present

## 2014-12-08 DIAGNOSIS — R296 Repeated falls: Secondary | ICD-10-CM | POA: Diagnosis not present

## 2014-12-08 DIAGNOSIS — R269 Unspecified abnormalities of gait and mobility: Secondary | ICD-10-CM | POA: Diagnosis not present

## 2014-12-08 DIAGNOSIS — M6281 Muscle weakness (generalized): Secondary | ICD-10-CM | POA: Diagnosis not present

## 2014-12-30 DIAGNOSIS — M170 Bilateral primary osteoarthritis of knee: Secondary | ICD-10-CM | POA: Diagnosis not present

## 2014-12-30 DIAGNOSIS — M1712 Unilateral primary osteoarthritis, left knee: Secondary | ICD-10-CM | POA: Diagnosis not present

## 2014-12-30 DIAGNOSIS — M25561 Pain in right knee: Secondary | ICD-10-CM | POA: Diagnosis not present

## 2014-12-30 DIAGNOSIS — M1711 Unilateral primary osteoarthritis, right knee: Secondary | ICD-10-CM | POA: Diagnosis not present

## 2015-02-03 DIAGNOSIS — M1712 Unilateral primary osteoarthritis, left knee: Secondary | ICD-10-CM | POA: Diagnosis not present

## 2015-02-03 DIAGNOSIS — M1711 Unilateral primary osteoarthritis, right knee: Secondary | ICD-10-CM | POA: Diagnosis not present

## 2015-02-03 DIAGNOSIS — M170 Bilateral primary osteoarthritis of knee: Secondary | ICD-10-CM | POA: Diagnosis not present

## 2015-02-24 DIAGNOSIS — H35371 Puckering of macula, right eye: Secondary | ICD-10-CM | POA: Diagnosis not present

## 2015-02-24 DIAGNOSIS — H52203 Unspecified astigmatism, bilateral: Secondary | ICD-10-CM | POA: Diagnosis not present

## 2015-02-24 DIAGNOSIS — H25811 Combined forms of age-related cataract, right eye: Secondary | ICD-10-CM | POA: Diagnosis not present

## 2015-02-24 DIAGNOSIS — H1789 Other corneal scars and opacities: Secondary | ICD-10-CM | POA: Diagnosis not present

## 2015-03-30 DIAGNOSIS — M25512 Pain in left shoulder: Secondary | ICD-10-CM | POA: Diagnosis not present

## 2015-07-15 DIAGNOSIS — Z23 Encounter for immunization: Secondary | ICD-10-CM | POA: Diagnosis not present

## 2015-09-30 DIAGNOSIS — J309 Allergic rhinitis, unspecified: Secondary | ICD-10-CM | POA: Diagnosis not present

## 2015-09-30 DIAGNOSIS — L309 Dermatitis, unspecified: Secondary | ICD-10-CM | POA: Diagnosis not present

## 2015-09-30 DIAGNOSIS — Z1389 Encounter for screening for other disorder: Secondary | ICD-10-CM | POA: Diagnosis not present

## 2015-09-30 DIAGNOSIS — N183 Chronic kidney disease, stage 3 (moderate): Secondary | ICD-10-CM | POA: Diagnosis not present

## 2015-09-30 DIAGNOSIS — I129 Hypertensive chronic kidney disease with stage 1 through stage 4 chronic kidney disease, or unspecified chronic kidney disease: Secondary | ICD-10-CM | POA: Diagnosis not present

## 2015-09-30 DIAGNOSIS — Z23 Encounter for immunization: Secondary | ICD-10-CM | POA: Diagnosis not present

## 2015-09-30 DIAGNOSIS — E039 Hypothyroidism, unspecified: Secondary | ICD-10-CM | POA: Diagnosis not present

## 2015-09-30 DIAGNOSIS — D649 Anemia, unspecified: Secondary | ICD-10-CM | POA: Diagnosis not present

## 2015-09-30 DIAGNOSIS — Z79899 Other long term (current) drug therapy: Secondary | ICD-10-CM | POA: Diagnosis not present

## 2015-09-30 DIAGNOSIS — Z0000 Encounter for general adult medical examination without abnormal findings: Secondary | ICD-10-CM | POA: Diagnosis not present

## 2015-10-09 ENCOUNTER — Other Ambulatory Visit: Payer: Self-pay | Admitting: Nurse Practitioner

## 2015-10-09 ENCOUNTER — Ambulatory Visit
Admission: RE | Admit: 2015-10-09 | Discharge: 2015-10-09 | Disposition: A | Discharge status: Home / Self Care | Payer: Medicare Other | Source: Ambulatory Visit | Attending: Nurse Practitioner | Admitting: Nurse Practitioner

## 2015-10-09 DIAGNOSIS — R0602 Shortness of breath: Secondary | ICD-10-CM | POA: Diagnosis not present

## 2015-10-09 DIAGNOSIS — J219 Acute bronchiolitis, unspecified: Secondary | ICD-10-CM | POA: Diagnosis not present

## 2015-10-09 DIAGNOSIS — J069 Acute upper respiratory infection, unspecified: Secondary | ICD-10-CM

## 2015-10-09 DIAGNOSIS — R05 Cough: Secondary | ICD-10-CM | POA: Diagnosis not present

## 2015-10-09 DIAGNOSIS — R062 Wheezing: Secondary | ICD-10-CM | POA: Diagnosis not present

## 2015-10-16 DIAGNOSIS — J189 Pneumonia, unspecified organism: Secondary | ICD-10-CM | POA: Diagnosis not present

## 2015-10-16 DIAGNOSIS — J069 Acute upper respiratory infection, unspecified: Secondary | ICD-10-CM | POA: Diagnosis not present

## 2015-11-13 ENCOUNTER — Other Ambulatory Visit: Payer: Self-pay | Admitting: Nurse Practitioner

## 2015-11-13 ENCOUNTER — Ambulatory Visit
Admission: RE | Admit: 2015-11-13 | Discharge: 2015-11-13 | Disposition: A | Discharge status: Home / Self Care | Payer: Medicare Other | Source: Ambulatory Visit | Attending: Nurse Practitioner | Admitting: Nurse Practitioner

## 2015-11-13 DIAGNOSIS — J069 Acute upper respiratory infection, unspecified: Secondary | ICD-10-CM

## 2015-11-13 DIAGNOSIS — R05 Cough: Secondary | ICD-10-CM | POA: Diagnosis not present

## 2015-12-03 DIAGNOSIS — M170 Bilateral primary osteoarthritis of knee: Secondary | ICD-10-CM | POA: Diagnosis not present

## 2015-12-03 DIAGNOSIS — K9403 Colostomy malfunction: Secondary | ICD-10-CM | POA: Diagnosis not present

## 2015-12-03 DIAGNOSIS — N183 Chronic kidney disease, stage 3 (moderate): Secondary | ICD-10-CM | POA: Diagnosis not present

## 2015-12-03 DIAGNOSIS — Z8701 Personal history of pneumonia (recurrent): Secondary | ICD-10-CM | POA: Diagnosis not present

## 2015-12-03 DIAGNOSIS — F0390 Unspecified dementia without behavioral disturbance: Secondary | ICD-10-CM | POA: Diagnosis not present

## 2015-12-03 DIAGNOSIS — I129 Hypertensive chronic kidney disease with stage 1 through stage 4 chronic kidney disease, or unspecified chronic kidney disease: Secondary | ICD-10-CM | POA: Diagnosis not present

## 2015-12-04 DIAGNOSIS — I129 Hypertensive chronic kidney disease with stage 1 through stage 4 chronic kidney disease, or unspecified chronic kidney disease: Secondary | ICD-10-CM | POA: Diagnosis not present

## 2015-12-04 DIAGNOSIS — Z8701 Personal history of pneumonia (recurrent): Secondary | ICD-10-CM | POA: Diagnosis not present

## 2015-12-04 DIAGNOSIS — K9403 Colostomy malfunction: Secondary | ICD-10-CM | POA: Diagnosis not present

## 2015-12-04 DIAGNOSIS — M170 Bilateral primary osteoarthritis of knee: Secondary | ICD-10-CM | POA: Diagnosis not present

## 2015-12-04 DIAGNOSIS — F0390 Unspecified dementia without behavioral disturbance: Secondary | ICD-10-CM | POA: Diagnosis not present

## 2015-12-04 DIAGNOSIS — N183 Chronic kidney disease, stage 3 (moderate): Secondary | ICD-10-CM | POA: Diagnosis not present

## 2015-12-08 DIAGNOSIS — F0390 Unspecified dementia without behavioral disturbance: Secondary | ICD-10-CM | POA: Diagnosis not present

## 2015-12-08 DIAGNOSIS — Z8701 Personal history of pneumonia (recurrent): Secondary | ICD-10-CM | POA: Diagnosis not present

## 2015-12-08 DIAGNOSIS — K9403 Colostomy malfunction: Secondary | ICD-10-CM | POA: Diagnosis not present

## 2015-12-08 DIAGNOSIS — I129 Hypertensive chronic kidney disease with stage 1 through stage 4 chronic kidney disease, or unspecified chronic kidney disease: Secondary | ICD-10-CM | POA: Diagnosis not present

## 2015-12-08 DIAGNOSIS — N183 Chronic kidney disease, stage 3 (moderate): Secondary | ICD-10-CM | POA: Diagnosis not present

## 2015-12-08 DIAGNOSIS — M170 Bilateral primary osteoarthritis of knee: Secondary | ICD-10-CM | POA: Diagnosis not present

## 2015-12-10 DIAGNOSIS — Z8701 Personal history of pneumonia (recurrent): Secondary | ICD-10-CM | POA: Diagnosis not present

## 2015-12-10 DIAGNOSIS — M170 Bilateral primary osteoarthritis of knee: Secondary | ICD-10-CM | POA: Diagnosis not present

## 2015-12-10 DIAGNOSIS — F0390 Unspecified dementia without behavioral disturbance: Secondary | ICD-10-CM | POA: Diagnosis not present

## 2015-12-10 DIAGNOSIS — K9403 Colostomy malfunction: Secondary | ICD-10-CM | POA: Diagnosis not present

## 2015-12-10 DIAGNOSIS — I129 Hypertensive chronic kidney disease with stage 1 through stage 4 chronic kidney disease, or unspecified chronic kidney disease: Secondary | ICD-10-CM | POA: Diagnosis not present

## 2015-12-10 DIAGNOSIS — N183 Chronic kidney disease, stage 3 (moderate): Secondary | ICD-10-CM | POA: Diagnosis not present

## 2015-12-15 DIAGNOSIS — M170 Bilateral primary osteoarthritis of knee: Secondary | ICD-10-CM | POA: Diagnosis not present

## 2015-12-15 DIAGNOSIS — K9403 Colostomy malfunction: Secondary | ICD-10-CM | POA: Diagnosis not present

## 2015-12-15 DIAGNOSIS — N183 Chronic kidney disease, stage 3 (moderate): Secondary | ICD-10-CM | POA: Diagnosis not present

## 2015-12-15 DIAGNOSIS — F0390 Unspecified dementia without behavioral disturbance: Secondary | ICD-10-CM | POA: Diagnosis not present

## 2015-12-15 DIAGNOSIS — I129 Hypertensive chronic kidney disease with stage 1 through stage 4 chronic kidney disease, or unspecified chronic kidney disease: Secondary | ICD-10-CM | POA: Diagnosis not present

## 2015-12-15 DIAGNOSIS — Z8701 Personal history of pneumonia (recurrent): Secondary | ICD-10-CM | POA: Diagnosis not present

## 2015-12-18 DIAGNOSIS — K9403 Colostomy malfunction: Secondary | ICD-10-CM | POA: Diagnosis not present

## 2015-12-18 DIAGNOSIS — Z8701 Personal history of pneumonia (recurrent): Secondary | ICD-10-CM | POA: Diagnosis not present

## 2015-12-18 DIAGNOSIS — I129 Hypertensive chronic kidney disease with stage 1 through stage 4 chronic kidney disease, or unspecified chronic kidney disease: Secondary | ICD-10-CM | POA: Diagnosis not present

## 2015-12-18 DIAGNOSIS — N183 Chronic kidney disease, stage 3 (moderate): Secondary | ICD-10-CM | POA: Diagnosis not present

## 2015-12-18 DIAGNOSIS — M170 Bilateral primary osteoarthritis of knee: Secondary | ICD-10-CM | POA: Diagnosis not present

## 2015-12-18 DIAGNOSIS — F0390 Unspecified dementia without behavioral disturbance: Secondary | ICD-10-CM | POA: Diagnosis not present

## 2015-12-28 DIAGNOSIS — R600 Localized edema: Secondary | ICD-10-CM | POA: Diagnosis not present

## 2016-02-19 DIAGNOSIS — H52202 Unspecified astigmatism, left eye: Secondary | ICD-10-CM | POA: Diagnosis not present

## 2016-02-19 DIAGNOSIS — H31001 Unspecified chorioretinal scars, right eye: Secondary | ICD-10-CM | POA: Diagnosis not present

## 2016-02-19 DIAGNOSIS — H1789 Other corneal scars and opacities: Secondary | ICD-10-CM | POA: Diagnosis not present

## 2016-03-30 DIAGNOSIS — E669 Obesity, unspecified: Secondary | ICD-10-CM | POA: Diagnosis not present

## 2016-03-30 DIAGNOSIS — Z933 Colostomy status: Secondary | ICD-10-CM | POA: Diagnosis not present

## 2016-03-30 DIAGNOSIS — I129 Hypertensive chronic kidney disease with stage 1 through stage 4 chronic kidney disease, or unspecified chronic kidney disease: Secondary | ICD-10-CM | POA: Diagnosis not present

## 2016-03-30 DIAGNOSIS — E039 Hypothyroidism, unspecified: Secondary | ICD-10-CM | POA: Diagnosis not present

## 2016-03-30 DIAGNOSIS — N183 Chronic kidney disease, stage 3 (moderate): Secondary | ICD-10-CM | POA: Diagnosis not present

## 2016-03-30 DIAGNOSIS — Z79899 Other long term (current) drug therapy: Secondary | ICD-10-CM | POA: Diagnosis not present

## 2016-03-30 DIAGNOSIS — F5101 Primary insomnia: Secondary | ICD-10-CM | POA: Diagnosis not present

## 2016-03-30 DIAGNOSIS — Z6832 Body mass index (BMI) 32.0-32.9, adult: Secondary | ICD-10-CM | POA: Diagnosis not present

## 2016-07-04 DIAGNOSIS — Z23 Encounter for immunization: Secondary | ICD-10-CM | POA: Diagnosis not present

## 2016-09-14 DIAGNOSIS — M170 Bilateral primary osteoarthritis of knee: Secondary | ICD-10-CM | POA: Diagnosis not present

## 2016-09-14 DIAGNOSIS — R600 Localized edema: Secondary | ICD-10-CM | POA: Diagnosis not present

## 2016-09-15 DIAGNOSIS — M170 Bilateral primary osteoarthritis of knee: Secondary | ICD-10-CM | POA: Diagnosis not present

## 2016-09-15 DIAGNOSIS — R600 Localized edema: Secondary | ICD-10-CM | POA: Diagnosis not present

## 2016-09-19 DIAGNOSIS — M170 Bilateral primary osteoarthritis of knee: Secondary | ICD-10-CM | POA: Diagnosis not present

## 2016-09-19 DIAGNOSIS — R600 Localized edema: Secondary | ICD-10-CM | POA: Diagnosis not present

## 2016-09-22 DIAGNOSIS — M170 Bilateral primary osteoarthritis of knee: Secondary | ICD-10-CM | POA: Diagnosis not present

## 2016-09-22 DIAGNOSIS — R600 Localized edema: Secondary | ICD-10-CM | POA: Diagnosis not present

## 2016-09-23 DIAGNOSIS — R600 Localized edema: Secondary | ICD-10-CM | POA: Diagnosis not present

## 2016-09-23 DIAGNOSIS — M170 Bilateral primary osteoarthritis of knee: Secondary | ICD-10-CM | POA: Diagnosis not present

## 2016-09-27 DIAGNOSIS — R600 Localized edema: Secondary | ICD-10-CM | POA: Diagnosis not present

## 2016-09-27 DIAGNOSIS — M170 Bilateral primary osteoarthritis of knee: Secondary | ICD-10-CM | POA: Diagnosis not present

## 2016-09-30 DIAGNOSIS — M170 Bilateral primary osteoarthritis of knee: Secondary | ICD-10-CM | POA: Diagnosis not present

## 2016-09-30 DIAGNOSIS — G8929 Other chronic pain: Secondary | ICD-10-CM | POA: Diagnosis not present

## 2016-09-30 DIAGNOSIS — E669 Obesity, unspecified: Secondary | ICD-10-CM | POA: Diagnosis not present

## 2016-09-30 DIAGNOSIS — Z1389 Encounter for screening for other disorder: Secondary | ICD-10-CM | POA: Diagnosis not present

## 2016-09-30 DIAGNOSIS — I129 Hypertensive chronic kidney disease with stage 1 through stage 4 chronic kidney disease, or unspecified chronic kidney disease: Secondary | ICD-10-CM | POA: Diagnosis not present

## 2016-09-30 DIAGNOSIS — E039 Hypothyroidism, unspecified: Secondary | ICD-10-CM | POA: Diagnosis not present

## 2016-09-30 DIAGNOSIS — L239 Allergic contact dermatitis, unspecified cause: Secondary | ICD-10-CM | POA: Diagnosis not present

## 2016-09-30 DIAGNOSIS — Z79899 Other long term (current) drug therapy: Secondary | ICD-10-CM | POA: Diagnosis not present

## 2016-09-30 DIAGNOSIS — N183 Chronic kidney disease, stage 3 (moderate): Secondary | ICD-10-CM | POA: Diagnosis not present

## 2016-09-30 DIAGNOSIS — R269 Unspecified abnormalities of gait and mobility: Secondary | ICD-10-CM | POA: Diagnosis not present

## 2016-09-30 DIAGNOSIS — R600 Localized edema: Secondary | ICD-10-CM | POA: Diagnosis not present

## 2016-09-30 DIAGNOSIS — Z96653 Presence of artificial knee joint, bilateral: Secondary | ICD-10-CM | POA: Diagnosis not present

## 2016-09-30 DIAGNOSIS — M25562 Pain in left knee: Secondary | ICD-10-CM | POA: Diagnosis not present

## 2016-09-30 DIAGNOSIS — M25561 Pain in right knee: Secondary | ICD-10-CM | POA: Diagnosis not present

## 2016-10-01 DIAGNOSIS — M170 Bilateral primary osteoarthritis of knee: Secondary | ICD-10-CM | POA: Diagnosis not present

## 2016-10-01 DIAGNOSIS — R600 Localized edema: Secondary | ICD-10-CM | POA: Diagnosis not present

## 2016-10-04 DIAGNOSIS — M170 Bilateral primary osteoarthritis of knee: Secondary | ICD-10-CM | POA: Diagnosis not present

## 2016-10-04 DIAGNOSIS — R600 Localized edema: Secondary | ICD-10-CM | POA: Diagnosis not present

## 2016-10-06 DIAGNOSIS — M170 Bilateral primary osteoarthritis of knee: Secondary | ICD-10-CM | POA: Diagnosis not present

## 2016-10-06 DIAGNOSIS — R600 Localized edema: Secondary | ICD-10-CM | POA: Diagnosis not present

## 2016-10-10 ENCOUNTER — Ambulatory Visit
Admission: RE | Admit: 2016-10-10 | Discharge: 2016-10-10 | Disposition: A | Discharge status: Home / Self Care | Payer: Medicare Other | Source: Ambulatory Visit | Attending: Internal Medicine | Admitting: Internal Medicine

## 2016-10-10 ENCOUNTER — Other Ambulatory Visit: Payer: Self-pay | Admitting: Internal Medicine

## 2016-10-10 DIAGNOSIS — R0602 Shortness of breath: Secondary | ICD-10-CM | POA: Diagnosis not present

## 2016-10-10 DIAGNOSIS — R05 Cough: Secondary | ICD-10-CM

## 2016-10-10 DIAGNOSIS — R059 Cough, unspecified: Secondary | ICD-10-CM

## 2016-10-10 DIAGNOSIS — R0689 Other abnormalities of breathing: Secondary | ICD-10-CM | POA: Diagnosis not present

## 2016-10-11 ENCOUNTER — Emergency Department (HOSPITAL_COMMUNITY): Payer: Medicare Other

## 2016-10-11 ENCOUNTER — Encounter (HOSPITAL_COMMUNITY): Payer: Self-pay | Admitting: Emergency Medicine

## 2016-10-11 ENCOUNTER — Inpatient Hospital Stay (HOSPITAL_COMMUNITY)
Admission: EM | Admit: 2016-10-11 | Discharge: 2016-10-17 | DRG: 871 | Disposition: A | Discharge status: Nursing Home (Includes ICF) | Payer: Medicare Other | Attending: Family Medicine | Admitting: Family Medicine

## 2016-10-11 DIAGNOSIS — R6510 Systemic inflammatory response syndrome (SIRS) of non-infectious origin without acute organ dysfunction: Secondary | ICD-10-CM | POA: Diagnosis present

## 2016-10-11 DIAGNOSIS — R600 Localized edema: Secondary | ICD-10-CM | POA: Diagnosis not present

## 2016-10-11 DIAGNOSIS — A419 Sepsis, unspecified organism: Secondary | ICD-10-CM | POA: Diagnosis not present

## 2016-10-11 DIAGNOSIS — R0902 Hypoxemia: Secondary | ICD-10-CM | POA: Diagnosis not present

## 2016-10-11 DIAGNOSIS — J440 Chronic obstructive pulmonary disease with acute lower respiratory infection: Secondary | ICD-10-CM | POA: Diagnosis not present

## 2016-10-11 DIAGNOSIS — J441 Chronic obstructive pulmonary disease with (acute) exacerbation: Secondary | ICD-10-CM | POA: Diagnosis not present

## 2016-10-11 DIAGNOSIS — R069 Unspecified abnormalities of breathing: Secondary | ICD-10-CM | POA: Diagnosis not present

## 2016-10-11 DIAGNOSIS — Z933 Colostomy status: Secondary | ICD-10-CM

## 2016-10-11 DIAGNOSIS — Z8542 Personal history of malignant neoplasm of other parts of uterus: Secondary | ICD-10-CM

## 2016-10-11 DIAGNOSIS — F0390 Unspecified dementia without behavioral disturbance: Secondary | ICD-10-CM | POA: Diagnosis present

## 2016-10-11 DIAGNOSIS — B974 Respiratory syncytial virus as the cause of diseases classified elsewhere: Secondary | ICD-10-CM | POA: Diagnosis present

## 2016-10-11 DIAGNOSIS — N179 Acute kidney failure, unspecified: Secondary | ICD-10-CM | POA: Diagnosis not present

## 2016-10-11 DIAGNOSIS — Z87891 Personal history of nicotine dependence: Secondary | ICD-10-CM

## 2016-10-11 DIAGNOSIS — J189 Pneumonia, unspecified organism: Secondary | ICD-10-CM | POA: Diagnosis present

## 2016-10-11 DIAGNOSIS — E039 Hypothyroidism, unspecified: Secondary | ICD-10-CM | POA: Diagnosis present

## 2016-10-11 DIAGNOSIS — Z8249 Family history of ischemic heart disease and other diseases of the circulatory system: Secondary | ICD-10-CM

## 2016-10-11 DIAGNOSIS — R0602 Shortness of breath: Secondary | ICD-10-CM | POA: Diagnosis not present

## 2016-10-11 DIAGNOSIS — J9601 Acute respiratory failure with hypoxia: Secondary | ICD-10-CM | POA: Diagnosis not present

## 2016-10-11 DIAGNOSIS — R0600 Dyspnea, unspecified: Secondary | ICD-10-CM

## 2016-10-11 DIAGNOSIS — J181 Lobar pneumonia, unspecified organism: Secondary | ICD-10-CM

## 2016-10-11 DIAGNOSIS — R000 Tachycardia, unspecified: Secondary | ICD-10-CM

## 2016-10-11 DIAGNOSIS — Z66 Do not resuscitate: Secondary | ICD-10-CM | POA: Diagnosis present

## 2016-10-11 DIAGNOSIS — I2510 Atherosclerotic heart disease of native coronary artery without angina pectoris: Secondary | ICD-10-CM | POA: Diagnosis present

## 2016-10-11 DIAGNOSIS — Z90710 Acquired absence of both cervix and uterus: Secondary | ICD-10-CM

## 2016-10-11 DIAGNOSIS — I10 Essential (primary) hypertension: Secondary | ICD-10-CM

## 2016-10-11 DIAGNOSIS — M170 Bilateral primary osteoarthritis of knee: Secondary | ICD-10-CM | POA: Diagnosis not present

## 2016-10-11 LAB — CBC WITH DIFFERENTIAL/PLATELET
BASOS ABS: 0 10*3/uL (ref 0.0–0.1)
Basophils Relative: 0 %
EOS PCT: 0 %
Eosinophils Absolute: 0 10*3/uL (ref 0.0–0.7)
HEMATOCRIT: 35.8 % — AB (ref 36.0–46.0)
Hemoglobin: 11.8 g/dL — ABNORMAL LOW (ref 12.0–15.0)
LYMPHS ABS: 1.2 10*3/uL (ref 0.7–4.0)
LYMPHS PCT: 11 %
MCH: 29.6 pg (ref 26.0–34.0)
MCHC: 33 g/dL (ref 30.0–36.0)
MCV: 89.7 fL (ref 78.0–100.0)
Monocytes Absolute: 1 10*3/uL (ref 0.1–1.0)
Monocytes Relative: 10 %
NEUTROS ABS: 8.4 10*3/uL — AB (ref 1.7–7.7)
Neutrophils Relative %: 79 %
PLATELETS: 216 10*3/uL (ref 150–400)
RBC: 3.99 MIL/uL (ref 3.87–5.11)
RDW: 14.8 % (ref 11.5–15.5)
WBC: 10.6 10*3/uL — AB (ref 4.0–10.5)

## 2016-10-11 LAB — COMPREHENSIVE METABOLIC PANEL
ALT: 18 U/L (ref 14–54)
ANION GAP: 11 (ref 5–15)
AST: 29 U/L (ref 15–41)
Albumin: 3.6 g/dL (ref 3.5–5.0)
Alkaline Phosphatase: 72 U/L (ref 38–126)
BUN: 27 mg/dL — AB (ref 6–20)
CHLORIDE: 100 mmol/L — AB (ref 101–111)
CO2: 26 mmol/L (ref 22–32)
Calcium: 8.5 mg/dL — ABNORMAL LOW (ref 8.9–10.3)
Creatinine, Ser: 1.64 mg/dL — ABNORMAL HIGH (ref 0.44–1.00)
GFR calc Af Amer: 30 mL/min — ABNORMAL LOW (ref >60)
GFR, EST NON AFRICAN AMERICAN: 26 mL/min — AB (ref >60)
GLUCOSE: 106 mg/dL — AB (ref 65–99)
POTASSIUM: 4.3 mmol/L (ref 3.5–5.1)
Sodium: 137 mmol/L (ref 135–145)
TOTAL PROTEIN: 7 g/dL (ref 6.5–8.1)
Total Bilirubin: 0.8 mg/dL (ref 0.3–1.2)

## 2016-10-11 LAB — TROPONIN I: Troponin I: <0.03 ng/mL (ref <0.03)

## 2016-10-11 LAB — I-STAT CG4 LACTIC ACID, ED
LACTIC ACID, VENOUS: 2.78 mmol/L — AB (ref 0.5–1.9)
LACTIC ACID, VENOUS: 3.59 mmol/L — AB (ref 0.5–1.9)

## 2016-10-11 LAB — BRAIN NATRIURETIC PEPTIDE: B Natriuretic Peptide: 174 pg/mL — ABNORMAL HIGH (ref 0.0–100.0)

## 2016-10-11 MED ORDER — METHYLPREDNISOLONE SODIUM SUCC 125 MG IJ SOLR
125.0000 mg | Freq: Once | INTRAMUSCULAR | Status: AC
  Administered 2016-10-11: 125 mg via INTRAVENOUS
  Filled 2016-10-11: qty 2

## 2016-10-11 MED ORDER — ALBUTEROL SULFATE (2.5 MG/3ML) 0.083% IN NEBU
5.0000 mg | INHALATION_SOLUTION | Freq: Once | RESPIRATORY_TRACT | Status: AC
  Administered 2016-10-11: 5 mg via RESPIRATORY_TRACT
  Filled 2016-10-11: qty 6

## 2016-10-11 MED ORDER — IPRATROPIUM-ALBUTEROL 0.5-2.5 (3) MG/3ML IN SOLN
3.0000 mL | RESPIRATORY_TRACT | Status: DC
  Administered 2016-10-11 (×2): 3 mL via RESPIRATORY_TRACT
  Filled 2016-10-11 (×2): qty 3

## 2016-10-11 MED ORDER — LEVOFLOXACIN IN D5W 750 MG/150ML IV SOLN
750.0000 mg | Freq: Once | INTRAVENOUS | Status: AC
  Administered 2016-10-11: 750 mg via INTRAVENOUS
  Filled 2016-10-11: qty 150

## 2016-10-11 MED ORDER — SODIUM CHLORIDE 0.9 % IV BOLUS (SEPSIS)
1000.0000 mL | Freq: Once | INTRAVENOUS | Status: AC
  Administered 2016-10-11: 1000 mL via INTRAVENOUS

## 2016-10-11 NOTE — ED Notes (Addendum)
Pt weaned to RA; maintaining SpO2 at 95%; improvement of wheezing after breathing treatment

## 2016-10-11 NOTE — ED Notes (Signed)
Bed: [id number] Expected date:  Expected time:  Means of arrival:  Comments: 6F/gen. Weakness/cough

## 2016-10-11 NOTE — ED Provider Notes (Signed)
[place] [facility] DEPT Provider Note   CSN: [id number] Arrival date & time: 10/11/16  1857     History   Chief Complaint Chief Complaint  Patient presents with  . Shortness of Breath    HPI [name] [name] is a 83 y.o. female.   Shortness of Breath  This is a new problem. The current episode started more than 2 days ago. The problem has been gradually worsening. Associated symptoms include a fever (101) and cough. Pertinent negatives include no chest pain, no syncope, no leg pain and no leg swelling. She has tried nothing for the symptoms. The treatment provided no relief. Associated medical issues include COPD.    Past Medical History:  Diagnosis Date  . Anemia   . Arthritis   . CAD (coronary artery disease)   . Cancer [facility])    Endometrial  . Congenital disease    Left forearm  . Dementia   . Diverticulosis   . History of degenerative disc disease    Both knees  . Hypertension   . Osteoporosis    osteopenia  . Thyroid disease    Hypothyroidism    Patient Active Problem List   Diagnosis Date Noted  . Colostomy in place [facility]) 12/10/2013    Past Surgical History:  Procedure Laterality Date  . ABDOMINAL HYSTERECTOMY    . BOWEL RESECTION    . COLOSTOMY  [id number]  . COLOSTOMY    . OTHER SURGICAL HISTORY    . ROTATOR CUFF REPAIR  1990s    OB History    No data available       Home Medications    Prior to Admission medications   Medication Sig Start Date End Date Taking? Authorizing Provider  bumetanide (BUMEX) 1 MG tablet Take 1 mg by mouth daily.    Historical Provider, MD  ferrous sulfate 325 (65 FE) MG tablet Take 325 mg by mouth daily with breakfast.    Historical Provider, MD  levothyroxine (SYNTHROID, LEVOTHROID) 75 MCG tablet Take 75 mcg by mouth daily before breakfast.    Historical Provider, MD  lidocaine (LIDODERM) 5 % Place 1 patch onto the skin daily. Remove & Discard patch within 12 hours or as directed by MD    Historical Provider, MD  LORazepam  (ATIVAN) 0.5 MG tablet Take 0.5 mg by mouth every 6 (six) hours as needed for anxiety.    Historical Provider, MD  metoprolol succinate (TOPROL-XL) 25 MG 24 hr tablet Take 25 mg by mouth 2 (two) times daily.    Historical Provider, MD  mirtazapine (REMERON) 15 MG tablet Take 15 mg by mouth at bedtime.    Historical Provider, MD  omeprazole (PRILOSEC) 20 MG capsule Take 20 mg by mouth daily.    Historical Provider, MD  polyethylene glycol (MIRALAX / GLYCOLAX) packet Take 17 g by mouth daily. Mixed with 8 ounces of water.    Historical Provider, MD  potassium chloride (KLOR-CON) 20 MEQ packet Take by mouth 2 (two) times daily.    Historical Provider, MD  traMADol (ULTRAM) 50 MG tablet Take 50 mg by mouth 2 (two) times daily. BID and q 4 hrs as needed.    Historical Provider, MD    Family History Family History  Problem Relation Age of Onset  . Breast cancer Sister   . Heart disease Mother   . Heart attack Father     Social History Social History  Substance Use Topics  . Smoking status: Former Research scientist (life sciences)  . Smokeless tobacco: Never Used  .  Alcohol use No     Allergies   Sulfa antibiotics and Penicillins   Review of Systems Review of Systems  Constitutional: Positive for fever (101).  HENT: Positive for congestion.   Respiratory: Positive for cough and shortness of breath.   Cardiovascular: Negative for chest pain, leg swelling and syncope.  Musculoskeletal: Positive for back pain (chronic).  All other systems reviewed and are negative.    Physical Exam Updated Vital Signs BP 153/74 (BP Location: Left Arm)   Pulse 86   Temp 98.3 F (36.8 C) (Oral)   Resp 20   SpO2 96%   Physical Exam  Constitutional: She is oriented to person, place, and time. She appears well-developed and well-nourished.  HENT:  Head: Normocephalic and atraumatic.  Eyes: Conjunctivae and EOM are normal. Pupils are equal, round, and reactive to light.  Neck: Normal range of motion.  Cardiovascular:  Normal rate and regular rhythm.   Pulmonary/Chest: No stridor. No respiratory distress. She has rales (RUL).  Abdominal: Soft. She exhibits no distension.  Musculoskeletal: Normal range of motion. She exhibits no edema or deformity.  Neurological: She is alert and oriented to person, place, and time.  Skin: Skin is warm and dry. No pallor.  Nursing note and vitals reviewed.    ED Treatments / Results  Labs (all labs ordered are listed, but only abnormal results are displayed) Labs Reviewed  COMPREHENSIVE METABOLIC PANEL  CBC WITH DIFFERENTIAL/PLATELET  URINALYSIS, ROUTINE W REFLEX MICROSCOPIC  BRAIN NATRIURETIC PEPTIDE  TROPONIN I  I-STAT CG4 LACTIC ACID, ED    EKG  EKG Interpretation None       Radiology Dg Chest 2 View  Result Date: 10/11/2016 CLINICAL DATA:  Short of breath EXAM: CHEST  2 VIEW COMPARISON:  10/10/2016 FINDINGS: COPD with. Pulmonary hyperinflation with apical scarring bilaterally. Negative for heart failure or pneumonia. Improvement in vascular congestion since the prior study. No pleural effusion or mass lesion. IMPRESSION: COPD without acute abnormality. Electronically Signed   By: Franchot Gallo M.D.   On: 10/11/2016 19:48   Dg Chest 2 View  Result Date: 10/10/2016 CLINICAL DATA:  Cough, shortness of breath, history of coronary artery disease, former smoker. EXAM: CHEST  2 VIEW COMPARISON:  PA and lateral chest x-ray of November 13, 2015. FINDINGS: The lungs are mildly hyperinflated with hemidiaphragm flattening. There is no focal infiltrate. There is no pleural effusion. The heart is mildly enlarged and the central pulmonary vascularity is prominent the pulmonary interstitial markings are mildly increased overall. There is calcification in the wall of the aortic arch. There is stable biapical pleural thickening. There is mild multilevel degenerative disc disease of the thoracic spine. There are chronic degenerative changes of the right shoulder. IMPRESSION:  COPD. Superimposed low-grade CHF with mild interstitial edema. No alveolar pneumonia. Thoracic aortic atherosclerosis. Electronically Signed   By: David  Martinique M.D.   On: 10/10/2016 15:13    Procedures Procedures (including critical care time)  Medications Ordered in ED Medications  levofloxacin (LEVAQUIN) IVPB 750 mg (not administered)  methylPREDNISolone sodium succinate (SOLU-MEDROL) 125 mg/2 mL injection 125 mg (not administered)  albuterol (PROVENTIL) (2.5 MG/3ML) 0.083% nebulizer solution 5 mg (5 mg Nebulization Given 10/11/16 1944)     Initial Impression / Assessment and Plan / ED Course  I have reviewed the triage vital signs and the nursing notes.  Pertinent labs & imaging results that were available during my care of the patient were reviewed by me and considered in my medical decision making (see chart  for details).  Clinical Course    Likely COPD, however does have rales in RUL only, so agree with continuing antibiotics. Will check bnp/troponin to ensure no e/o CHF exacerbation while treating for COPD> Labs unremarkable, however respiratory status worsened while in ED, became more tachycardic, tachypneic and required 2L to stay above 90%. Already treated w/ levaquin, will admit for further obs and workup.   Final Clinical Impressions(s) / ED Diagnoses   Final diagnoses:  COPD exacerbation ([place])  Hypoxia  Community acquired pneumonia of right upper lobe of lung ([place])    New Prescriptions New Prescriptions   No medications on file     Merrily Pew, MD 10/12/16 1816

## 2016-10-11 NOTE — ED Notes (Signed)
MD at bedside. 

## 2016-10-11 NOTE — ED Triage Notes (Signed)
Pt reports to ER from [place] at [facility] assisted living for SOB related to cough; cough began on Friday and has worsened since; pt diagnosed with Pna at PCP yesterday and given ABX and Tessalon Perles; pt had episode of SOB today with SpO2 decreasing to 93%; pt c/o increased lethargy/weakness today as well; current SpO2 is 93% on RA; [place] applied for comfort

## 2016-10-12 DIAGNOSIS — R0602 Shortness of breath: Secondary | ICD-10-CM | POA: Diagnosis not present

## 2016-10-12 DIAGNOSIS — A419 Sepsis, unspecified organism: Principal | ICD-10-CM

## 2016-10-12 DIAGNOSIS — I10 Essential (primary) hypertension: Secondary | ICD-10-CM | POA: Diagnosis not present

## 2016-10-12 DIAGNOSIS — R6510 Systemic inflammatory response syndrome (SIRS) of non-infectious origin without acute organ dysfunction: Secondary | ICD-10-CM | POA: Diagnosis present

## 2016-10-12 DIAGNOSIS — E039 Hypothyroidism, unspecified: Secondary | ICD-10-CM | POA: Diagnosis present

## 2016-10-12 DIAGNOSIS — Z87891 Personal history of nicotine dependence: Secondary | ICD-10-CM | POA: Diagnosis not present

## 2016-10-12 DIAGNOSIS — Z66 Do not resuscitate: Secondary | ICD-10-CM | POA: Diagnosis present

## 2016-10-12 DIAGNOSIS — N179 Acute kidney failure, unspecified: Secondary | ICD-10-CM

## 2016-10-12 DIAGNOSIS — J189 Pneumonia, unspecified organism: Secondary | ICD-10-CM | POA: Diagnosis present

## 2016-10-12 DIAGNOSIS — B974 Respiratory syncytial virus as the cause of diseases classified elsewhere: Secondary | ICD-10-CM | POA: Diagnosis present

## 2016-10-12 DIAGNOSIS — Z8249 Family history of ischemic heart disease and other diseases of the circulatory system: Secondary | ICD-10-CM | POA: Diagnosis not present

## 2016-10-12 DIAGNOSIS — R000 Tachycardia, unspecified: Secondary | ICD-10-CM

## 2016-10-12 DIAGNOSIS — J181 Lobar pneumonia, unspecified organism: Secondary | ICD-10-CM | POA: Diagnosis not present

## 2016-10-12 DIAGNOSIS — Z8542 Personal history of malignant neoplasm of other parts of uterus: Secondary | ICD-10-CM | POA: Diagnosis not present

## 2016-10-12 DIAGNOSIS — J9601 Acute respiratory failure with hypoxia: Secondary | ICD-10-CM | POA: Diagnosis present

## 2016-10-12 DIAGNOSIS — Z90710 Acquired absence of both cervix and uterus: Secondary | ICD-10-CM | POA: Diagnosis not present

## 2016-10-12 DIAGNOSIS — I2510 Atherosclerotic heart disease of native coronary artery without angina pectoris: Secondary | ICD-10-CM | POA: Diagnosis present

## 2016-10-12 DIAGNOSIS — J441 Chronic obstructive pulmonary disease with (acute) exacerbation: Secondary | ICD-10-CM | POA: Diagnosis not present

## 2016-10-12 DIAGNOSIS — F0390 Unspecified dementia without behavioral disturbance: Secondary | ICD-10-CM | POA: Diagnosis present

## 2016-10-12 DIAGNOSIS — Z933 Colostomy status: Secondary | ICD-10-CM | POA: Diagnosis not present

## 2016-10-12 DIAGNOSIS — R05 Cough: Secondary | ICD-10-CM | POA: Diagnosis not present

## 2016-10-12 DIAGNOSIS — J440 Chronic obstructive pulmonary disease with acute lower respiratory infection: Secondary | ICD-10-CM | POA: Diagnosis present

## 2016-10-12 LAB — LACTIC ACID, PLASMA
Lactic Acid, Venous: 4.2 mmol/L (ref 0.5–1.9)
Lactic Acid, Venous: 1.3 mmol/L (ref 0.5–1.9)

## 2016-10-12 LAB — URINALYSIS, ROUTINE W REFLEX MICROSCOPIC
BILIRUBIN URINE: NEGATIVE
Glucose, UA: NEGATIVE mg/dL
Hgb urine dipstick: NEGATIVE
KETONES UR: NEGATIVE mg/dL
Leukocytes, UA: NEGATIVE
NITRITE: NEGATIVE
PH: 6 (ref 5.0–8.0)
Protein, ur: NEGATIVE mg/dL
Specific Gravity, Urine: 1.011 (ref 1.005–1.030)

## 2016-10-12 LAB — BASIC METABOLIC PANEL
Anion gap: 8 (ref 5–15)
BUN: 25 mg/dL — ABNORMAL HIGH (ref 6–20)
CHLORIDE: 105 mmol/L (ref 101–111)
CO2: 21 mmol/L — AB (ref 22–32)
CREATININE: 1.49 mg/dL — AB (ref 0.44–1.00)
Calcium: 7.4 mg/dL — ABNORMAL LOW (ref 8.9–10.3)
GFR calc non Af Amer: 29 mL/min — ABNORMAL LOW (ref >60)
GFR, EST AFRICAN AMERICAN: 34 mL/min — AB (ref >60)
GLUCOSE: 187 mg/dL — AB (ref 65–99)
Potassium: 4.1 mmol/L (ref 3.5–5.1)
Sodium: 134 mmol/L — ABNORMAL LOW (ref 135–145)

## 2016-10-12 LAB — INFLUENZA PANEL BY PCR (TYPE A & B)
INFLAPCR: NEGATIVE
INFLBPCR: NEGATIVE

## 2016-10-12 LAB — CBC
HEMATOCRIT: 31.2 % — AB (ref 36.0–46.0)
HEMOGLOBIN: 10.1 g/dL — AB (ref 12.0–15.0)
MCH: 29 pg (ref 26.0–34.0)
MCHC: 32.4 g/dL (ref 30.0–36.0)
MCV: 89.7 fL (ref 78.0–100.0)
Platelets: 181 10*3/uL (ref 150–400)
RBC: 3.48 MIL/uL — ABNORMAL LOW (ref 3.87–5.11)
RDW: 15.1 % (ref 11.5–15.5)
WBC: 14.9 10*3/uL — ABNORMAL HIGH (ref 4.0–10.5)

## 2016-10-12 LAB — GLUCOSE, CAPILLARY
GLUCOSE-CAPILLARY: 117 mg/dL — AB (ref 65–99)
GLUCOSE-CAPILLARY: 130 mg/dL — AB (ref 65–99)
Glucose-Capillary: 201 mg/dL — ABNORMAL HIGH (ref 65–99)

## 2016-10-12 LAB — HIV ANTIBODY (ROUTINE TESTING W REFLEX): HIV Screen 4th Generation wRfx: NONREACTIVE

## 2016-10-12 LAB — MRSA PCR SCREENING: MRSA by PCR: NEGATIVE

## 2016-10-12 LAB — PROCALCITONIN: PROCALCITONIN: 0.32 ng/mL

## 2016-10-12 MED ORDER — BOOST / RESOURCE BREEZE PO LIQD
1.0000 | Freq: Three times a day (TID) | ORAL | Status: DC
  Administered 2016-10-12 – 2016-10-16 (×10): 1 via ORAL

## 2016-10-12 MED ORDER — SODIUM CHLORIDE 0.9 % IV BOLUS (SEPSIS)
1000.0000 mL | Freq: Once | INTRAVENOUS | Status: AC
  Administered 2016-10-12: 1000 mL via INTRAVENOUS

## 2016-10-12 MED ORDER — PREDNISONE 20 MG PO TABS
40.0000 mg | ORAL_TABLET | Freq: Every day | ORAL | Status: DC
  Administered 2016-10-12 – 2016-10-17 (×6): 40 mg via ORAL
  Filled 2016-10-12 (×6): qty 2

## 2016-10-12 MED ORDER — ALBUTEROL SULFATE (2.5 MG/3ML) 0.083% IN NEBU: 2.5000 mg | INHALATION_SOLUTION | RESPIRATORY_TRACT | Status: DC | PRN

## 2016-10-12 MED ORDER — IPRATROPIUM-ALBUTEROL 0.5-2.5 (3) MG/3ML IN SOLN
3.0000 mL | Freq: Three times a day (TID) | RESPIRATORY_TRACT | Status: DC
Start: 2016-10-12 — End: 2016-10-17
  Administered 2016-10-12 – 2016-10-17 (×15): 3 mL via RESPIRATORY_TRACT
  Filled 2016-10-12 (×16): qty 3

## 2016-10-12 MED ORDER — SODIUM CHLORIDE 0.9 % IV SOLN
INTRAVENOUS | Status: AC
  Administered 2016-10-12: 02:00:00 via INTRAVENOUS

## 2016-10-12 MED ORDER — INSULIN ASPART 100 UNIT/ML ~~LOC~~ SOLN
0.0000 [IU] | Freq: Three times a day (TID) | SUBCUTANEOUS | Status: DC
  Administered 2016-10-12: 1 [IU] via SUBCUTANEOUS
  Administered 2016-10-13 (×2): 2 [IU] via SUBCUTANEOUS
  Administered 2016-10-14: 1 [IU] via SUBCUTANEOUS
  Administered 2016-10-14: 2 [IU] via SUBCUTANEOUS
  Administered 2016-10-15: 1 [IU] via SUBCUTANEOUS
  Administered 2016-10-16: 2 [IU] via SUBCUTANEOUS

## 2016-10-12 MED ORDER — METOPROLOL TARTRATE 25 MG PO TABS
25.0000 mg | ORAL_TABLET | Freq: Two times a day (BID) | ORAL | Status: DC
  Administered 2016-10-12 – 2016-10-17 (×12): 25 mg via ORAL
  Filled 2016-10-12 (×12): qty 1

## 2016-10-12 MED ORDER — LORATADINE 10 MG PO TABS
10.0000 mg | ORAL_TABLET | Freq: Every day | ORAL | Status: DC
Start: 2016-10-12 — End: 2016-10-17
  Administered 2016-10-12 – 2016-10-17 (×6): 10 mg via ORAL
  Filled 2016-10-12 (×6): qty 1

## 2016-10-12 MED ORDER — SODIUM CHLORIDE 0.9 % IV BOLUS (SEPSIS)
500.0000 mL | Freq: Once | INTRAVENOUS | Status: AC
  Administered 2016-10-12: 500 mL via INTRAVENOUS

## 2016-10-12 MED ORDER — ENOXAPARIN SODIUM 30 MG/0.3ML ~~LOC~~ SOLN
30.0000 mg | SUBCUTANEOUS | Status: DC
  Administered 2016-10-12 – 2016-10-13 (×2): 30 mg via SUBCUTANEOUS
  Filled 2016-10-12 (×2): qty 0.3

## 2016-10-12 MED ORDER — TRAMADOL HCL 50 MG PO TABS
50.0000 mg | ORAL_TABLET | Freq: Three times a day (TID) | ORAL | Status: DC | PRN
  Administered 2016-10-12 – 2016-10-17 (×4): 50 mg via ORAL
  Filled 2016-10-12 (×4): qty 1

## 2016-10-12 MED ORDER — LEVOFLOXACIN IN D5W 750 MG/150ML IV SOLN: 750.0000 mg | INTRAVENOUS | Status: DC

## 2016-10-12 MED ORDER — DEXTROSE 5 % IV SOLN
1.0000 g | INTRAVENOUS | Status: DC
  Administered 2016-10-12 – 2016-10-13 (×2): 1 g via INTRAVENOUS
  Filled 2016-10-12 (×2): qty 10

## 2016-10-12 MED ORDER — ACETAMINOPHEN 325 MG PO TABS: 650.0000 mg | ORAL_TABLET | Freq: Four times a day (QID) | ORAL | Status: DC | PRN

## 2016-10-12 MED ORDER — BENZONATATE 100 MG PO CAPS
100.0000 mg | ORAL_CAPSULE | Freq: Three times a day (TID) | ORAL | Status: DC | PRN
  Administered 2016-10-12: 100 mg via ORAL
  Filled 2016-10-12: qty 1

## 2016-10-12 MED ORDER — ONDANSETRON HCL 4 MG/2ML IJ SOLN: 4.0000 mg | Freq: Four times a day (QID) | INTRAMUSCULAR | Status: DC | PRN

## 2016-10-12 MED ORDER — IPRATROPIUM-ALBUTEROL 0.5-2.5 (3) MG/3ML IN SOLN
3.0000 mL | Freq: Four times a day (QID) | RESPIRATORY_TRACT | Status: DC
  Administered 2016-10-12: 3 mL via RESPIRATORY_TRACT
  Filled 2016-10-12: qty 3

## 2016-10-12 MED ORDER — FLUTICASONE PROPIONATE 50 MCG/ACT NA SUSP
2.0000 | Freq: Every day | NASAL | Status: DC
  Administered 2016-10-12 – 2016-10-17 (×6): 2 via NASAL
  Filled 2016-10-12: qty 16

## 2016-10-12 MED ORDER — GUAIFENESIN-DM 100-10 MG/5ML PO SYRP
5.0000 mL | ORAL_SOLUTION | ORAL | Status: DC | PRN
  Administered 2016-10-12 – 2016-10-16 (×5): 5 mL via ORAL
  Filled 2016-10-12 (×5): qty 10

## 2016-10-12 MED ORDER — LEVOTHYROXINE SODIUM 50 MCG PO TABS
75.0000 ug | ORAL_TABLET | Freq: Every day | ORAL | Status: DC
  Administered 2016-10-12 – 2016-10-17 (×6): 75 ug via ORAL
  Filled 2016-10-12 (×6): qty 1

## 2016-10-12 MED ORDER — MENTHOL 3 MG MT LOZG
1.0000 | LOZENGE | OROMUCOSAL | Status: DC | PRN
  Administered 2016-10-12 (×2): 3 mg via ORAL
  Filled 2016-10-12: qty 9

## 2016-10-12 NOTE — Progress Notes (Signed)
PROGRESS NOTE    [name] [name]  [id number] DOB: Aug 28, 1923 DOA: 10/11/2016 PCP: Mathews Argyle, MD    Brief Narrative:  [name] [name] is a 83 y.o. woman with a history of dementia, endometrial cancer, colostomy, HTN, and hypothyroidism who presents to the ED via EMS after being referred from her assisted living facility.  She reports 4-5 days of dry cough, weakness, decreased PO intake, and subjective fevers.  She reports multiple sick contacts at her ALF, but she is not certain that any of them have been diagnosed with influenza.  She received the influenza vaccine this year.  She saw her PCP (or partner) yesterday and was given a prescription for Avelox and tessalon perles.  She has not improved.  ED Course: Upon arrival here, the ED attending was concerned for active wheezing with decreased breath sounds bilaterally.  The patient also demonstrated a new oxygen requirement, desatting to the mid 80's with ambulation on RA.  She has maintained O2 sats 96-97% with 2L [place].  Patient has received breathing treatments, IV solumedrol, and IV levaquin.  Flu screen is pending.  Hospitalist asked to place in observation.  Lactic acid level is elevated and rising.  She received 1L of NS earlier and was given additional 1 liter. Chest xray does not show acute infiltrate but she has changes consistent with COPD. Troponin negative.  BUN and creatinine elevated; baseline unclear. She has persistent tachycardia (which may now be influenced by the IV steroids and albuterol), but no chest pain.    Assessment & Plan:   Principal Problem:   Sepsis ([place]) Active Problems:   CAP (community acquired pneumonia)   Sinus tachycardia   HTN (hypertension)   Sepsis secondary to CAP with acute hypoxic respiratory failure.  Possible mild COPD exacerbation. --Place on telemetry with continuous pulse oximetry --Wean oxygen when tolerated --Change levaquin to azithromycin --Reduce frequency of duonebs now (can likely  go to PRN by morning) --Prednisone 40mg  daily, next dose in AM --Check urine legionella antigen --Blood cultures pending --Flu screen negative -- droplet precautions until respiratory panel resulted --NS at 75cc/hr x 10 hours after 2L in the ED -- lactic acid level of 1.3 -- Procalcitonin of 0.32  Possible AKI, baseline unclear --Receiving IV fluids --HOLD diuretic and potassium -- improved Cr -- repeat BMP in am -- monitor BP -- consider restarting Bumex in am  Hypothyroidism --Continue home dose of levothyroxine  HTN --Metoprolol   DVT prophylaxis: Lovenox Code Status: DNR Family Communication: Patient alone in the ED at time of admission Disposition Plan: To be determined will likely d/c back to previous facility when medically stably   Consultants:   PT  Procedures:   None  Antimicrobials:   Levaquin 1/10  Azithromycin 1/10    Subjective: Patient sitting up in bed and says her cough is very disruptive.  She mentions she feels like if her cough were controlled she would feel better.  Denies feeling short of breath and is asking if she can get up.  PT eval ordered.  Objective: Vitals:   10/12/16 0146 10/12/16 0529 10/12/16 0809 10/12/16 0931  BP: (!) 150/50 (!) 148/60  (!) 152/55  Pulse: (!) 117 89 86 91  Resp:  (!) 24 (!) 22   Temp: 97.7 F (36.5 C) 98.4 F (36.9 C)    TempSrc: Oral Oral    SpO2: 98% 95% 98%   Weight: 89 kg (196 lb 3.4 oz)     Height: 5\' 6"  (1.676 m)  Intake/Output Summary (Last 24 hours) at 10/12/16 1413 Last data filed at 10/12/16 0857  Gross per 24 hour  Intake          1028.75 ml  Output                0 ml  Net          1028.75 ml   Filed Weights   10/12/16 0146  Weight: 89 kg (196 lb 3.4 oz)    Examination:  General exam: Appears calm and comfortable  Respiratory system: Diffuse wheezing on expiration noted with no rhonchi or crackles appreciated Cardiovascular system: S1 & S2 heard, RRR. No JVD,  murmurs, rubs, gallops or clicks. No pedal edema. Gastrointestinal system: Abdomen is nondistended, soft and nontender. No organomegaly or masses felt. Normal bowel sounds heard. Central nervous system: Alert and oriented. No focal neurological deficits. Extremities: knees painful with palpation bilaterally Skin: No rashes, lesions or ulcers Psychiatry: Judgement and insight appear normal. Mood & affect appropriate.     Data Reviewed: I have personally reviewed following labs and imaging studies  CBC:  Recent Labs Lab 10/11/16 1953 10/12/16 0557  WBC 10.6* 14.9*  NEUTROABS 8.4*  --   HGB 11.8* 10.1*  HCT 35.8* 31.2*  MCV 89.7 89.7  PLT 216 0000000   Basic Metabolic Panel:  Recent Labs Lab 10/11/16 1953 10/12/16 0557  NA 137 134*  K 4.3 4.1  CL 100* 105  CO2 26 21*  GLUCOSE 106* 187*  BUN 27* 25*  CREATININE 1.64* 1.49*  CALCIUM 8.5* 7.4*   GFR: Estimated Creatinine Clearance: 27.6 mL/min (by C-G formula based on SCr of 1.49 mg/dL (H)). Liver Function Tests:  Recent Labs Lab 10/11/16 1953  AST 29  ALT 18  ALKPHOS 72  BILITOT 0.8  PROT 7.0  ALBUMIN 3.6   No results for input(s): LIPASE, AMYLASE in the last 168 hours. No results for input(s): AMMONIA in the last 168 hours. Coagulation Profile: No results for input(s): INR, PROTIME in the last 168 hours. Cardiac Enzymes:  Recent Labs Lab 10/11/16 2017  TROPONINI <0.03   BNP (last 3 results) No results for input(s): PROBNP in the last 8760 hours. HbA1C: No results for input(s): HGBA1C in the last 72 hours. CBG:  Recent Labs Lab 10/12/16 1302  GLUCAP 117*   Lipid Profile: No results for input(s): CHOL, HDL, LDLCALC, TRIG, CHOLHDL, LDLDIRECT in the last 72 hours. Thyroid Function Tests: No results for input(s): TSH, T4TOTAL, FREET4, T3FREE, THYROIDAB in the last 72 hours. Anemia Panel: No results for input(s): VITAMINB12, FOLATE, FERRITIN, TIBC, IRON, RETICCTPCT in the last 72 hours. Sepsis  Labs:  Recent Labs Lab 10/11/16 2043 10/11/16 2325 10/12/16 0244 10/12/16 0557  PROCALCITON  --   --  0.32  --   LATICACIDVEN 2.78* 3.59* 4.2* 1.3    Recent Results (from the past 240 hour(s))  MRSA PCR Screening     Status: None   Collection Time: 10/12/16  2:29 AM  Result Value Ref Range Status   MRSA by PCR NEGATIVE NEGATIVE Final    Comment:        The GeneXpert MRSA Assay (FDA approved for NASAL specimens only), is one component of a comprehensive MRSA colonization surveillance program. It is not intended to diagnose MRSA infection nor to guide or monitor treatment for MRSA infections.   Culture, blood (routine x 2) Call MD if unable to obtain prior to antibiotics being given     Status: None (Preliminary result)  Collection Time: 10/12/16  5:57 AM  Result Value Ref Range Status   Specimen Description BLOOD RIGHT ARM  Final   Special Requests BOTTLES DRAWN AEROBIC AND ANAEROBIC 5CC  Final   Culture   Final    NO GROWTH < 12 HOURS Performed at [facility]    Report Status PENDING  Incomplete  Culture, blood (routine x 2) Call MD if unable to obtain prior to antibiotics being given     Status: None (Preliminary result)   Collection Time: 10/12/16  5:57 AM  Result Value Ref Range Status   Specimen Description BLOOD RIGHT HAND  Final   Special Requests IN PEDIATRIC BOTTLE [place]  Final   Culture   Final    NO GROWTH < 12 HOURS Performed at [facility]    Report Status PENDING  Incomplete         Radiology Studies: Dg Chest 2 View  Result Date: 10/11/2016 CLINICAL DATA:  Short of breath EXAM: CHEST  2 VIEW COMPARISON:  10/10/2016 FINDINGS: COPD with. Pulmonary hyperinflation with apical scarring bilaterally. Negative for heart failure or pneumonia. Improvement in vascular congestion since the prior study. No pleural effusion or mass lesion. IMPRESSION: COPD without acute abnormality. Electronically Signed   By: Franchot Gallo M.D.   On: 10/11/2016  19:48   Dg Chest 2 View  Result Date: 10/10/2016 CLINICAL DATA:  Cough, shortness of breath, history of coronary artery disease, former smoker. EXAM: CHEST  2 VIEW COMPARISON:  PA and lateral chest x-ray of November 13, 2015. FINDINGS: The lungs are mildly hyperinflated with hemidiaphragm flattening. There is no focal infiltrate. There is no pleural effusion. The heart is mildly enlarged and the central pulmonary vascularity is prominent the pulmonary interstitial markings are mildly increased overall. There is calcification in the wall of the aortic arch. There is stable biapical pleural thickening. There is mild multilevel degenerative disc disease of the thoracic spine. There are chronic degenerative changes of the right shoulder. IMPRESSION: COPD. Superimposed low-grade CHF with mild interstitial edema. No alveolar pneumonia. Thoracic aortic atherosclerosis. Electronically Signed   By: David  Martinique M.D.   On: 10/10/2016 15:13        Scheduled Meds: . cefTRIAXone (ROCEPHIN)  IV  1 g Intravenous Q24H  . enoxaparin (LOVENOX) injection  30 mg Subcutaneous Q24H  . feeding supplement  1 Container Oral TID BM  . fluticasone  2 spray Each Nare Daily  . insulin aspart  0-9 Units Subcutaneous TID WC  . ipratropium-albuterol  3 mL Nebulization TID  . levothyroxine  75 mcg Oral QAC breakfast  . loratadine  10 mg Oral Daily  . metoprolol tartrate  25 mg Oral BID  . predniSONE  40 mg Oral Q breakfast   Continuous Infusions:   LOS: 0 days    Time spent: 35 minutes    Loretha Stapler, MD [facility] Pager [phone]  If 7PM-7AM, please contact night-coverage [email or web address] Password [email or web address] 10/12/2016, 2:13 PM

## 2016-10-12 NOTE — Care Management Obs Status (Signed)
[place] NOTIFICATION   Patient Details  Name: [name] [name] MRN: [id number] Date of Birth: 11-May-1923   Medicare Observation Status Notification Given:  Yes    MahabirJuliann Pulse, RN 10/12/2016, 2:07 PM

## 2016-10-12 NOTE — H&P (Signed)
History and Physical    [name] [name] [id number] DOB: Nov 27, 1922 DOA: 10/11/2016  PCP: Mathews Argyle, MD   Patient coming from: ALF via EMS  Chief Complaint: Cough, shortness of breath  HPI: [name] [name] is a 83 y.o. woman with a history of dementia, endometrial cancer, colostomy, HTN, and hypothyroidism who presents to the ED via EMS after being referred from her assisted living facility.  She reports 4-5 days of dry cough, weakness, decreased PO intake, and subjective fevers.  She reports multiple sick contacts at her ALF, but she is not certain that any of them have been diagnosed with influenza.  She received the influenza vaccine this year.  She saw her PCP (or partner) yesterday and was given a prescription for Avelox and tessalon perles.  She has not improved.  ED Course: Upon arrival here, the ED attending was concerned for active wheezing with decreased breath sounds bilaterally.  The patient also demonstrated a new oxygen requirement, desatting to the mid 80's with ambulation on RA.  She has maintained O2 sats 96-97% with 2L [place].  Patient has received breathing treatments, IV solumedrol, and IV levaquin.  Flu screen is pending.  Hospitalist asked to place in observation.  Lactic acid level is elevated and rising.  She received 1L of NS earlier; I have ordered another liter now.  Chest xray does not show acute infiltrate but she has changes consistent with COPD.  Troponin negative.  BUN and creatinine elevated; baseline unclear.  She has persistent tachycardia (which may now be influenced by the IV steroids and albuterol), but no chest pain.  She is complaining of dry mouth.  Review of Systems: As per HPI otherwise 10 point review of systems negative.    Past Medical History:  Diagnosis Date  . Anemia   . Arthritis   . CAD (coronary artery disease)   . Cancer [facility])    Endometrial  . Congenital disease    Left forearm  . Dementia   . Diverticulosis   . History of  degenerative disc disease    Both knees  . Hypertension   . Osteoporosis    osteopenia  . Thyroid disease    Hypothyroidism    Past Surgical History:  Procedure Laterality Date  . ABDOMINAL HYSTERECTOMY    . BOWEL RESECTION    . COLOSTOMY  [id number]  . COLOSTOMY    . OTHER SURGICAL HISTORY    . ROTATOR CUFF REPAIR  1990s     reports that she has quit smoking. She has never used smokeless tobacco. She reports that she does not drink alcohol or use drugs.  Allergies  Allergen Reactions  . Sulfa Antibiotics     Listed on MAR  . Penicillins Itching and Rash    Family History  Problem Relation Age of Onset  . Breast cancer Sister   . Heart disease Mother   . Heart attack Father      Prior to Admission medications   Medication Sig Start Date End Date Taking? Authorizing Provider  acetaminophen (TYLENOL) 325 MG tablet Take 650 mg by mouth daily. At 2 PM   Yes Historical Provider, MD  acetaminophen (TYLENOL) 650 MG CR tablet Take 650 mg by mouth daily as needed for pain.   Yes Historical Provider, MD  benzonatate (TESSALON) 100 MG capsule Take 100 mg by mouth 3 (three) times daily as needed for cough.   Yes Historical Provider, MD  bumetanide (BUMEX) 1 MG tablet Take 2 mg by mouth daily.  Yes Historical Provider, MD  fluticasone (FLONASE) 50 MCG/ACT nasal spray Place 2 sprays into both nostrils daily.   Yes Historical Provider, MD  levothyroxine (SYNTHROID, LEVOTHROID) 75 MCG tablet Take 75 mcg by mouth daily before breakfast.   Yes Historical Provider, MD  loratadine (CLARITIN) 10 MG tablet Take 10 mg by mouth daily.   Yes Historical Provider, MD  Melatonin 5 MG TABS Take 1 tablet by mouth at bedtime.   Yes Historical Provider, MD  metoprolol tartrate (LOPRESSOR) 25 MG tablet Take 25 mg by mouth 2 (two) times daily.   Yes Historical Provider, MD  moxifloxacin (AVELOX) 400 MG tablet Take 400 mg by mouth daily at 8 pm.   Yes Historical Provider, MD  potassium chloride  (KLOR-CON) 20 MEQ packet Take by mouth 2 (two) times daily.   Yes Historical Provider, MD  traMADol (ULTRAM) 50 MG tablet Take 50 mg by mouth as directed. Up to 3 times daily as needed  and at bedtime   Yes Historical Provider, MD  triamcinolone (KENALOG) 0.025 % cream Apply 1 application topically 2 (two) times daily as needed (for rash).   Yes Historical Provider, MD    Physical Exam: Vitals:   10/11/16 2345 10/11/16 2349 10/12/16 0003 10/12/16 0004  BP:   160/69   Pulse: 112  (!) 125   Resp: (!) 27  (!) 34   Temp:    98.8 F (37.1 C)  TempSrc:    Oral  SpO2: 94% 94% 96%       Constitutional: NAD, awake and alert but ill appearing. Vitals:   10/11/16 2345 10/11/16 2349 10/12/16 0003 10/12/16 0004  BP:   160/69   Pulse: 112  (!) 125   Resp: (!) 27  (!) 34   Temp:    98.8 F (37.1 C)  TempSrc:    Oral  SpO2: 94% 94% 96%    Eyes: PERRL, lids and conjunctivae normal ENMT: Mucous membranes are DRY. Posterior pharynx clear of any exudate or lesions. Normal dentition.  Neck: normal appearance, supple Respiratory: Diminished breath sounds bilaterally for me.  No ronchi or wheezing for me.  Mildly tachypnic, particularly with moving in the bed, but no accessory muscle use.  Cardiovascular: Tachycardic but regular.  No murmurs / rubs / gallops. No significant pitting edema bilaterally.  2+ pedal pulses. GI: abdomen is soft and compressible.  Ostomy present to left side, soft brown stool in the bag.  No distention.  No tenderness.  Bowel sounds are present. Musculoskeletal:  LUE prosthesis.  Good ROM in other extremities. No contractures. Normal muscle tone.  Skin: no rashes, warm and dry Neurologic: No focal deficits. Psychiatric: Normal judgment and insight. Alert and oriented to person, city (could not name hospital but knew she was in the ED and had come in by ambulance), month, year, [place] president.  Mood is appropriate.      Labs on Admission: I have personally reviewed  following labs and imaging studies  CBC:  Recent Labs Lab 10/11/16 1953  WBC 10.6*  NEUTROABS 8.4*  HGB 11.8*  HCT 35.8*  MCV 89.7  PLT 123XX123   Basic Metabolic Panel:  Recent Labs Lab 10/11/16 1953  NA 137  K 4.3  CL 100*  CO2 26  GLUCOSE 106*  BUN 27*  CREATININE 1.64*  CALCIUM 8.5*   GFR: CrCl cannot be calculated (Unknown ideal weight.). Liver Function Tests:  Recent Labs Lab 10/11/16 1953  AST 29  ALT 18  ALKPHOS 72  BILITOT  0.8  PROT 7.0  ALBUMIN 3.6   Cardiac Enzymes:  Recent Labs Lab 10/11/16 2017  TROPONINI <0.03   Urine analysis: Pending  Sepsis Labs:  Lactic acid level 2.78 then 3.59  Radiological Exams on Admission: Dg Chest 2 View  Result Date: 10/11/2016 CLINICAL DATA:  Short of breath EXAM: CHEST  2 VIEW COMPARISON:  10/10/2016 FINDINGS: COPD with. Pulmonary hyperinflation with apical scarring bilaterally. Negative for heart failure or pneumonia. Improvement in vascular congestion since the prior study. No pleural effusion or mass lesion. IMPRESSION: COPD without acute abnormality. Electronically Signed   By: Franchot Gallo M.D.   On: 10/11/2016 19:48   Dg Chest 2 View  Result Date: 10/10/2016 CLINICAL DATA:  Cough, shortness of breath, history of coronary artery disease, former smoker. EXAM: CHEST  2 VIEW COMPARISON:  PA and lateral chest x-ray of November 13, 2015. FINDINGS: The lungs are mildly hyperinflated with hemidiaphragm flattening. There is no focal infiltrate. There is no pleural effusion. The heart is mildly enlarged and the central pulmonary vascularity is prominent the pulmonary interstitial markings are mildly increased overall. There is calcification in the wall of the aortic arch. There is stable biapical pleural thickening. There is mild multilevel degenerative disc disease of the thoracic spine. There are chronic degenerative changes of the right shoulder. IMPRESSION: COPD. Superimposed low-grade CHF with mild interstitial  edema. No alveolar pneumonia. Thoracic aortic atherosclerosis. Electronically Signed   By: David  Martinique M.D.   On: 10/10/2016 15:13    EKG: Independently reviewed. Poor quality with erratic baseline but no clear ST elevation.  NSR.  Assessment/Plan Principal Problem:   Sepsis ([place]) Active Problems:   CAP (community acquired pneumonia)   Sinus tachycardia   HTN (hypertension)      Sepsis secondary to CAP with acute hypoxic respiratory failure.  Possible mild COPD exacerbation. --Place on telemetry with continuous pulse oximetry --Wean oxygen when tolerated --Continue IV levaquin --Reduce frequency of duonebs now (can likely go to PRN by morning) --Prednisone 40mg  daily, next dose in AM --Check urine legionella antigen --Blood cultures --Flu screen pending, droplet precautions --NS at 75cc/hr x 10 hours after 2L in the ED --Follow lactic acid level --Check procalcitonin  Possible AKI, baseline unclear --Receiving IV fluids --HOLD diuretic and potassium --Repeat BMP in the AM  Hypothyroidism --Continue home dose of levothyroxine  HTN --Metoprolol   DVT prophylaxis: Lovenox Code Status: DNR Family Communication: Patient alone in the ED at time of admission Disposition Plan: To be determined. Consults called: NONE Admission status: Place in observation with telemetry monitoring.   TIME SPENT: 70 minutes   Eber Jones MD [facility] Pager [phone]  If 7PM-7AM, please contact night-coverage [email or web address] Password [email or web address]  10/12/2016, 1:06 AM

## 2016-10-12 NOTE — Progress Notes (Signed)
NUTRITION NOTE  Pt seen in rounds this AM. Pt states she had orange juice for breakfast (RN reports pt drank 2 cups of juice) and that she is not a breakfast eater. She reports decreased appetite while being sick.   Will order Boost Breeze po TID, each supplement provides 250 kcal and 9 grams of protein. Will continue to monitor in rounds for additional nutrition-related needs or for need for full assessment by RD.    Jarome Matin, MS, RD, LDN, [facility] Inpatient Clinical Dietitian Pager # [phone] After hours/weekend pager # [phone]

## 2016-10-12 NOTE — Evaluation (Signed)
Physical Therapy Evaluation Patient Details Name: [name] [name] MRN: [id number] DOB: 1922-10-08 Today's Date: 10/12/2016   History of Present Illness  [name] [name] is a 83 y.o. woman with a history of dementia, endometrial cancer, colostomy, HTN, and hypothyroidism who presents to the ED via EMS after being referred from her assisted living facility.  She reports 4-5 days of dry cough, weakness  Clinical Impression  The patient appears much weaker than would expect from ALF. Pt admitted with above diagnosis. Pt currently with functional limitations due to the deficits listed below (see PT Problem List). Pt will benefit from skilled PT to increase their independence and safety with mobility to allow discharge to the venue listed below.       Follow Up Recommendations SNF;Supervision/Assistance - 24 hour;Home health PT (unless ALF can mange)    Equipment Recommendations  None recommended by PT    Recommendations for Other Services       Precautions / Restrictions Precautions Precautions: Fall Precaution Comments: WC dependent      Mobility  Bed Mobility Overal bed mobility: Needs Assistance Bed Mobility: Supine to Sit     Supine to sit: Mod assist;HOB elevated     General bed mobility comments: assist with trunk and legs  Transfers Overall transfer level: Needs assistance   Transfers: Sit to/from Stand;Stand Pivot Transfers Sit to Stand: Mod assist;+2 physical assistance;+2 safety/equipment Stand pivot transfers: Mod assist;+2 physical assistance;+2 safety/equipment       General transfer comment: assist to rise and picot steps to recliner.  Ambulation/Gait                Stairs            Wheelchair Mobility    Modified Rankin (Stroke Patients Only)       Balance Overall balance assessment: Needs assistance Sitting-balance support: Bilateral upper extremity supported;Feet supported Sitting balance-Leahy Scale: Fair                                        Pertinent Vitals/Pain Pain Assessment: No/denies pain    Home Living Family/patient expects to be discharged to:: Assisted living               Home Equipment: Wheelchair - manual      Prior Function Level of Independence: Needs assistance   Gait / Transfers Assistance Needed: nonambulatory, uses WC           Hand Dominance        Extremity/Trunk Assessment   Upper Extremity Assessment Upper Extremity Assessment: LUE deficits/detail;Generalized weakness LUE Deficits / Details: prosthetic    Lower Extremity Assessment Lower Extremity Assessment: Generalized weakness       Communication      Cognition Arousal/Alertness: Awake/alert Behavior During Therapy: WFL for tasks assessed/performed Overall Cognitive Status: Within Functional Limits for tasks assessed                      General Comments      Exercises     Assessment/Plan    PT Assessment Patient needs continued PT services  PT Problem List Decreased activity tolerance;Decreased strength;Decreased knowledge of precautions;Decreased mobility          PT Treatment Interventions Functional mobility training;DME instruction;Therapeutic activities    PT Goals (Current goals can be found in the Care Plan section)  Acute Rehab PT Goals Patient Stated Goal: wants to  get OOB PT Goal Formulation: With patient Time For Goal Achievement: 10/26/16 Potential to Achieve Goals: Fair    Frequency Min 3X/week   Barriers to discharge        Co-evaluation               End of Session   Activity Tolerance: Patient limited by fatigue Patient left: in chair;with call bell/phone within reach;with chair alarm set Nurse Communication: Mobility status    Functional Assessment Tool Used: clinical judgement Functional Limitation: Mobility: Walking and moving around Mobility: Walking and Moving Around Current Status [id number]): At least 80 percent but less than 100  percent impaired, limited or restricted Mobility: Walking and Moving Around Goal Status [phone]): At least 20 percent but less than 40 percent impaired, limited or restricted    Time: 1211-1232 PT Time Calculation (min) (ACUTE ONLY): 21 min   Charges:         PT G Codes:   PT G-Codes **NOT FOR INPATIENT CLASS** Functional Assessment Tool Used: clinical judgement Functional Limitation: Mobility: Walking and moving around Mobility: Walking and Moving Around Current Status [id number]): At least 80 percent but less than 100 percent impaired, limited or restricted Mobility: Walking and Moving Around Goal Status [phone]): At least 20 percent but less than 40 percent impaired, limited or restricted    Claretha Cooper 10/12/2016, 12:37 PM Tresa Endo PT [phone]

## 2016-10-12 NOTE — Care Management Note (Signed)
Case Management Note  Patient Details  Name: [name] [name] MRN: [id number] Date of Birth: 1923-01-11  Subjective/Objective:  PT recc SNF. CSW following.                  Action/Plan:d/c plan snf.   Expected Discharge Date:                  Expected Discharge Plan:  Skilled Nursing Facility  In-House Referral:  Clinical Social Work  Discharge planning Services  CM Consult  Post Acute Care Choice:    Choice offered to:     DME Arranged:    DME Agency:     HH Arranged:    [place] Agency:     Status of Service:  In process, will continue to follow  If discussed at Long Length of Stay Meetings, dates discussed:    Additional Comments:  Dessa Phi, RN 10/12/2016, 2:09 PM

## 2016-10-12 NOTE — Progress Notes (Signed)
Pharmacy Antibiotic Note  [name] [name] is a 83 y.o. female admitted on 10/11/2016 with pneumonia.  Pharmacy has been consulted for levofloxacin dosing.  Plan: Levofloxacin 750mg  iv q48hr  Height: 5\' 6"  (167.6 cm) Weight: 196 lb 3.4 oz (89 kg) IBW/kg (Calculated) : 59.3  Temp (24hrs), Avg:98.3 F (36.8 C), Min:97.7 F (36.5 C), Max:98.8 F (37.1 C)   Recent Labs Lab 10/11/16 1953 10/11/16 2043 10/11/16 2325  WBC 10.6*  --   --   CREATININE 1.64*  --   --   LATICACIDVEN  --  2.78* 3.59*    Estimated Creatinine Clearance: 25.1 mL/min (by C-G formula based on SCr of 1.64 mg/dL (H)).    Allergies  Allergen Reactions  . Sulfa Antibiotics     Listed on MAR  . Penicillins Itching and Rash    Antimicrobials this admission: Levofloxacin 1/9 >>  Dose adjustments this admission: -  Microbiology results: pending  Thank you for allowing pharmacy to be a part of this patient's care.  Nani Skillern Crowford 10/12/2016 2:17 AM

## 2016-10-12 NOTE — Care Management Note (Signed)
Case Management Note  Patient Details  Name: [name] [name] MRN: [id number] Date of Birth: 1923/09/16  Subjective/Objective: 83 y/o f admitted w/Sepsis. From ALF-[facility]. PT-recc SF.CSW following.                   Action/Plan:d/c plan SNF.   Expected Discharge Date:                  Expected Discharge Plan:  Assisted Living / Rest Home  In-House Referral:  Clinical Social Work  Discharge planning Services  CM Consult  Post Acute Care Choice:    Choice offered to:     DME Arranged:    DME Agency:     HH Arranged:    HH Agency:     Status of Service:  In process, will continue to follow  If discussed at Long Length of Stay Meetings, dates discussed:    Additional Comments:  Dessa Phi, RN 10/12/2016, 2:10 PM

## 2016-10-12 NOTE — Progress Notes (Addendum)
CRITICAL VALUE ALERT  Critical value received:  Lactic acid 4.2  Date of notification:  10/12/16  Time of notification: 0320  Critical value read back:Yes.    Nurse who received alert:  Rich Fuchs  MD notified (1st page):  Schorr  Time of first page:  0320  MD notified (2nd page):  Time of second page:  Responding MD:  Schorr  Time MD responded:  [phone]

## 2016-10-13 DIAGNOSIS — J210 Acute bronchiolitis due to respiratory syncytial virus: Secondary | ICD-10-CM

## 2016-10-13 LAB — BASIC METABOLIC PANEL
ANION GAP: 9 (ref 5–15)
BUN: 30 mg/dL — AB (ref 6–20)
CALCIUM: 7.7 mg/dL — AB (ref 8.9–10.3)
CO2: 21 mmol/L — ABNORMAL LOW (ref 22–32)
Chloride: 108 mmol/L (ref 101–111)
Creatinine, Ser: 1.33 mg/dL — ABNORMAL HIGH (ref 0.44–1.00)
GFR calc Af Amer: 39 mL/min — ABNORMAL LOW (ref >60)
GFR, EST NON AFRICAN AMERICAN: 34 mL/min — AB (ref >60)
GLUCOSE: 123 mg/dL — AB (ref 65–99)
Potassium: 3.7 mmol/L (ref 3.5–5.1)
SODIUM: 138 mmol/L (ref 135–145)

## 2016-10-13 LAB — RESPIRATORY PANEL BY PCR
Adenovirus: NOT DETECTED
BORDETELLA PERTUSSIS-RVPCR: NOT DETECTED
CHLAMYDOPHILA PNEUMONIAE-RVPPCR: NOT DETECTED
Coronavirus 229E: NOT DETECTED
Coronavirus HKU1: NOT DETECTED
Coronavirus NL63: NOT DETECTED
Coronavirus OC43: NOT DETECTED
INFLUENZA B-RVPPCR: NOT DETECTED
Influenza A: NOT DETECTED
METAPNEUMOVIRUS-RVPPCR: NOT DETECTED
Mycoplasma pneumoniae: NOT DETECTED
PARAINFLUENZA VIRUS 3-RVPPCR: NOT DETECTED
PARAINFLUENZA VIRUS 4-RVPPCR: NOT DETECTED
Parainfluenza Virus 1: NOT DETECTED
Parainfluenza Virus 2: NOT DETECTED
RESPIRATORY SYNCYTIAL VIRUS-RVPPCR: DETECTED — AB
RHINOVIRUS / ENTEROVIRUS - RVPPCR: NOT DETECTED

## 2016-10-13 LAB — STREP PNEUMONIAE URINARY ANTIGEN: STREP PNEUMO URINARY ANTIGEN: NEGATIVE

## 2016-10-13 LAB — GLUCOSE, CAPILLARY
GLUCOSE-CAPILLARY: 186 mg/dL — AB (ref 65–99)
Glucose-Capillary: 121 mg/dL — ABNORMAL HIGH (ref 65–99)
Glucose-Capillary: 162 mg/dL — ABNORMAL HIGH (ref 65–99)
Glucose-Capillary: 119 mg/dL — ABNORMAL HIGH (ref 65–99)

## 2016-10-13 LAB — BRAIN NATRIURETIC PEPTIDE: B NATRIURETIC PEPTIDE 5: 251.2 pg/mL — AB (ref 0.0–100.0)

## 2016-10-13 MED ORDER — POTASSIUM CHLORIDE CRYS ER 20 MEQ PO TBCR
40.0000 meq | EXTENDED_RELEASE_TABLET | Freq: Every day | ORAL | Status: DC
  Administered 2016-10-14 – 2016-10-17 (×4): 40 meq via ORAL
  Filled 2016-10-13 (×4): qty 2

## 2016-10-13 MED ORDER — BUMETANIDE 2 MG PO TABS
2.0000 mg | ORAL_TABLET | Freq: Every day | ORAL | Status: DC
Start: 2016-10-14 — End: 2016-10-17
  Administered 2016-10-14 – 2016-10-17 (×4): 2 mg via ORAL
  Filled 2016-10-13 (×4): qty 1

## 2016-10-13 MED ORDER — AZITHROMYCIN 250 MG PO TABS
500.0000 mg | ORAL_TABLET | Freq: Every day | ORAL | Status: DC
  Administered 2016-10-13: 500 mg via ORAL
  Filled 2016-10-13: qty 2

## 2016-10-13 MED ORDER — POTASSIUM CHLORIDE 20 MEQ PO PACK: 40.0000 meq | PACK | Freq: Every day | ORAL | Status: DC

## 2016-10-13 MED ORDER — DM-GUAIFENESIN ER 30-600 MG PO TB12
1.0000 | ORAL_TABLET | Freq: Two times a day (BID) | ORAL | Status: DC
  Administered 2016-10-13 – 2016-10-17 (×9): 1 via ORAL
  Filled 2016-10-13 (×9): qty 1

## 2016-10-13 NOTE — Clinical Social Work Note (Signed)
Clinical Social Work Assessment  Patient Details  Name: [name] [name] MRN: [id number] Date of Birth: [birth year]  Date of referral:  10/13/16               Reason for consult:  Facility Placement                Permission sought to share information with:  Facility Art therapist granted to share information::  Yes, Verbal Permission Granted  Name::        Agency::     Relationship::     Contact Information:     Housing/Transportation Living arrangements for the past 2 months:  [place] of Information:  Patient, Other (Comment Required) Patient Interpreter Needed:  None Criminal Activity/Legal Involvement Pertinent to Current Situation/Hospitalization:  No - Comment as needed Significant Relationships:  Other Family Members Lives with:  Facility Resident Do you feel safe going back to the place where you live?    Need for family participation in patient care:  Yes (Comment)  Care giving concerns:  CSW received consult that patient was admitted from [facility] ALF.    Social Worker assessment / plan:  CSW reviewed PT evaluation recommending SNF if ALF is unable to handle patient at discharge. Patient deferred decision making to her niece, Eustaquio Maize - CSW spoke with Eustaquio Maize but currently she was at [facility] with her mother/patient's sister & states that she will call CSW back to discuss discharge options.   Employment status:  Retired Forensic scientist:  [employer] PT Recommendations:  [place] / Referral to community resources:  [place]  Patient/Family's Response to care:    Patient/Family's Understanding of and Emotional Response to Diagnosis, Current Treatment, and Prognosis:    Emotional Assessment Appearance:  Appears stated age Attitude/Demeanor/Rapport:    Affect (typically observed):    Orientation:  Oriented to Self, Oriented to Place, Oriented to  Time, Oriented to  Situation Alcohol / Substance use:    Psych involvement (Current and /or in the community):     Discharge Needs  Concerns to be addressed:    Readmission within the last 30 days:    Current discharge risk:    Barriers to Discharge:      Standley Brooking, LCSW 10/13/2016, 3:09 PM

## 2016-10-13 NOTE — Clinical Social Work Placement (Signed)
   CLINICAL SOCIAL WORK PLACEMENT  NOTE  Date:  10/13/2016  Patient Details  Name: [name] [name] MRN: [id number] Date of Birth: 05/04/1923  Clinical Social Work is seeking post-discharge placement for this patient at the [place] level of care (*CSW will initial, date and re-position this form in  chart as items are completed):  Yes   Patient/family provided with [place] Work Department's list of facilities offering this level of care within the geographic area requested by the patient (or if unable, by the patient's family).  Yes   Patient/family informed of their freedom to choose among providers that offer the needed level of care, that participate in Medicare, Medicaid or managed care program needed by the patient, have an available bed and are willing to accept the patient.  Yes   Patient/family informed of [place]'s ownership interest in [facility] and [facility], as well as of the fact that they are under no obligation to receive care at these facilities.  PASRR submitted to EDS on       PASRR number received on       Existing PASRR number confirmed on 10/13/16     FL2 transmitted to all facilities in geographic area requested by pt/family on 10/13/16     FL2 transmitted to all facilities within larger geographic area on       Patient informed that his/her managed care company has contracts with or will negotiate with certain facilities, including the following:        Yes   Patient/family informed of bed offers received.  Patient chooses bed at       Physician recommends and patient chooses bed at      Patient to be transferred to   on  .  Patient to be transferred to facility by       Patient family notified on   of transfer.  Name of family member notified:        PHYSICIAN       Additional Comment:    _______________________________________________ Standley Brooking, LCSW 10/13/2016, 3:10 PM

## 2016-10-13 NOTE — Progress Notes (Signed)
PROGRESS NOTE    [name] [name]  [id number] DOB: 10-Dec-1922 DOA: 10/11/2016 PCP: Mathews Argyle, MD    Brief Narrative:  [name] [name] is a 83 y.o. woman with a history of dementia, endometrial cancer, colostomy, HTN, and hypothyroidism who presents to the ED via EMS after being referred from her assisted living facility.  She reports 4-5 days of dry cough, weakness, decreased PO intake, and subjective fevers.  She reports multiple sick contacts at her ALF, but she is not certain that any of them have been diagnosed with influenza.  She received the influenza vaccine this year.  She saw her PCP (or partner) yesterday and was given a prescription for Avelox and tessalon perles.  She has not improved.  ED Course: Upon arrival here, the ED attending was concerned for active wheezing with decreased breath sounds bilaterally.  The patient also demonstrated a new oxygen requirement, desatting to the mid 80's with ambulation on RA.  She has maintained O2 sats 96-97% with 2L [place].  Patient has received breathing treatments, IV solumedrol, and IV levaquin.  Flu screen is pending.  Hospitalist asked to place in observation.  Lactic acid level is elevated and rising.  She received 1L of NS earlier and was given additional 1 liter. Chest xray does not show acute infiltrate but she has changes consistent with COPD. Troponin negative.  BUN and creatinine elevated; baseline unclear. She has persistent tachycardia (which may now be influenced by the IV steroids and albuterol), but no chest pain.    Assessment & Plan:   Principal Problem:   Sepsis ([place]) Active Problems:   CAP (community acquired pneumonia)   Sinus tachycardia   HTN (hypertension)   COPD exacerbation (HCC)   Sepsis secondary to CAP with acute hypoxic respiratory failure.  Possible mild COPD exacerbation. --Place on telemetry with continuous pulse oximetry --Wean oxygen when tolerated --Change levaquin to azithromycin and  ceftriaxone --Prednisone 40mg  daily --Check urine legionella antigen --Blood cultures pending --Flu screen negative -- respiratory panel + for RSV -- lactic acid level of 1.3 -- Procalcitonin of 0.32  Possible AKI, baseline unclear -- improved Cr -- repeat BMP in am -- monitor BP -- can restart Bumex tomorrow as creatinine improved  Hypothyroidism --Continue home dose of levothyroxine  HTN --Metoprolol   DVT prophylaxis: Lovenox Code Status: DNR Family Communication: patient alone in room Disposition Plan: To be determined will likely d/c back to previous facility when medically stably   Consultants:   PT  Procedures:   None  Antimicrobials:   Levaquin 1/10  Azithromycin 1/11>  Ceftriaxone 1/10>   Subjective: Patient reports that her cough was lessened with cough syrup.  She mentions that she feels as though otherwise her cough is making her very hoarse.  Reports feeling very weak.  Respiratory virus panel positive for RSV.  Objective: Vitals:   10/13/16 0940 10/13/16 0943 10/13/16 1046 10/13/16 1436  BP:   (!) 189/79 (!) 148/64  Pulse:   89 75  Resp:    19  Temp:    98.4 F (36.9 C)  TempSrc:    Oral  SpO2: 96% 96%  94%  Weight:      Height:        Intake/Output Summary (Last 24 hours) at 10/13/16 1607 Last data filed at 10/13/16 1438  Gross per 24 hour  Intake              770 ml  Output  730 ml  Net               40 ml   Filed Weights   10/12/16 0146  Weight: 89 kg (196 lb 3.4 oz)    Examination:  General exam: Appears calm and comfortable  Respiratory system: Diffuse wheezing on expiration noted with bibasilar crackles Cardiovascular system: S1 & S2 heard, RRR. No JVD, murmurs, rubs, gallops or clicks. No pedal edema. Gastrointestinal system: Abdomen is nondistended, soft and nontender. No organomegaly or masses felt. Normal bowel sounds heard. Central nervous system: Alert and oriented. No focal neurological  deficits. Extremities: knees painful with palpation bilaterally, left arm prosthesis noted Skin: No rashes, lesions or ulcers Psychiatry: Judgement and insight appear normal. Mood & affect appropriate.     Data Reviewed: I have personally reviewed following labs and imaging studies  CBC:  Recent Labs Lab 10/11/16 1953 10/12/16 0557  WBC 10.6* 14.9*  NEUTROABS 8.4*  --   HGB 11.8* 10.1*  HCT 35.8* 31.2*  MCV 89.7 89.7  PLT 216 0000000   Basic Metabolic Panel:  Recent Labs Lab 10/11/16 1953 10/12/16 0557 10/13/16 1056  NA 137 134* 138  K 4.3 4.1 3.7  CL 100* 105 108  CO2 26 21* 21*  GLUCOSE 106* 187* 123*  BUN 27* 25* 30*  CREATININE 1.64* 1.49* 1.33*  CALCIUM 8.5* 7.4* 7.7*   GFR: Estimated Creatinine Clearance: 31 mL/min (by C-G formula based on SCr of 1.33 mg/dL (H)). Liver Function Tests:  Recent Labs Lab 10/11/16 1953  AST 29  ALT 18  ALKPHOS 72  BILITOT 0.8  PROT 7.0  ALBUMIN 3.6   No results for input(s): LIPASE, AMYLASE in the last 168 hours. No results for input(s): AMMONIA in the last 168 hours. Coagulation Profile: No results for input(s): INR, PROTIME in the last 168 hours. Cardiac Enzymes:  Recent Labs Lab 10/11/16 2017  TROPONINI <0.03   BNP (last 3 results) No results for input(s): PROBNP in the last 8760 hours. HbA1C: No results for input(s): HGBA1C in the last 72 hours. CBG:  Recent Labs Lab 10/12/16 1302 10/12/16 1814 10/12/16 2245 10/13/16 0730 10/13/16 1121  GLUCAP 117* 130* 201* 121* 186*   Lipid Profile: No results for input(s): CHOL, HDL, LDLCALC, TRIG, CHOLHDL, LDLDIRECT in the last 72 hours. Thyroid Function Tests: No results for input(s): TSH, T4TOTAL, FREET4, T3FREE, THYROIDAB in the last 72 hours. Anemia Panel: No results for input(s): VITAMINB12, FOLATE, FERRITIN, TIBC, IRON, RETICCTPCT in the last 72 hours. Sepsis Labs:  Recent Labs Lab 10/11/16 2043 10/11/16 2325 10/12/16 0244 10/12/16 0557   PROCALCITON  --   --  0.32  --   LATICACIDVEN 2.78* 3.59* 4.2* 1.3    Recent Results (from the past 240 hour(s))  MRSA PCR Screening     Status: None   Collection Time: 10/12/16  2:29 AM  Result Value Ref Range Status   MRSA by PCR NEGATIVE NEGATIVE Final    Comment:        The GeneXpert MRSA Assay (FDA approved for NASAL specimens only), is one component of a comprehensive MRSA colonization surveillance program. It is not intended to diagnose MRSA infection nor to guide or monitor treatment for MRSA infections.   Culture, blood (routine x 2) Call MD if unable to obtain prior to antibiotics being given     Status: None (Preliminary result)   Collection Time: 10/12/16  5:57 AM  Result Value Ref Range Status   Specimen Description BLOOD RIGHT ARM  Final   Special Requests BOTTLES DRAWN AEROBIC AND ANAEROBIC 5CC  Final   Culture   Final    NO GROWTH 1 DAY Performed at [facility]    Report Status PENDING  Incomplete  Culture, blood (routine x 2) Call MD if unable to obtain prior to antibiotics being given     Status: None (Preliminary result)   Collection Time: 10/12/16  5:57 AM  Result Value Ref Range Status   Specimen Description BLOOD RIGHT HAND  Final   Special Requests IN PEDIATRIC BOTTLE [place]  Final   Culture   Final    NO GROWTH 1 DAY Performed at [facility]    Report Status PENDING  Incomplete  Respiratory Panel by PCR     Status: Abnormal   Collection Time: 10/12/16  1:00 PM  Result Value Ref Range Status   Adenovirus NOT DETECTED NOT DETECTED Final   Coronavirus 229E NOT DETECTED NOT DETECTED Final   Coronavirus HKU1 NOT DETECTED NOT DETECTED Final   Coronavirus NL63 NOT DETECTED NOT DETECTED Final   Coronavirus OC43 NOT DETECTED NOT DETECTED Final   Metapneumovirus NOT DETECTED NOT DETECTED Final   Rhinovirus / Enterovirus NOT DETECTED NOT DETECTED Final   Influenza A NOT DETECTED NOT DETECTED Final   Influenza B NOT DETECTED NOT DETECTED  Final   Parainfluenza Virus 1 NOT DETECTED NOT DETECTED Final   Parainfluenza Virus 2 NOT DETECTED NOT DETECTED Final   Parainfluenza Virus 3 NOT DETECTED NOT DETECTED Final   Parainfluenza Virus 4 NOT DETECTED NOT DETECTED Final   Respiratory Syncytial Virus DETECTED (A) NOT DETECTED Final    Comment: CRITICAL RESULT CALLED TO, READ BACK BY AND VERIFIED WITH: Marrian Salvage RN 15:15 10/13/16 ([email or web address])    Bordetella pertussis NOT DETECTED NOT DETECTED Final   Chlamydophila pneumoniae NOT DETECTED NOT DETECTED Final   Mycoplasma pneumoniae NOT DETECTED NOT DETECTED Final    Comment: Performed at [facility]         Radiology Studies: Dg Chest 2 View  Result Date: 10/11/2016 CLINICAL DATA:  Short of breath EXAM: CHEST  2 VIEW COMPARISON:  10/10/2016 FINDINGS: COPD with. Pulmonary hyperinflation with apical scarring bilaterally. Negative for heart failure or pneumonia. Improvement in vascular congestion since the prior study. No pleural effusion or mass lesion. IMPRESSION: COPD without acute abnormality. Electronically Signed   By: Franchot Gallo M.D.   On: 10/11/2016 19:48        Scheduled Meds: . azithromycin  500 mg Oral Daily  . cefTRIAXone (ROCEPHIN)  IV  1 g Intravenous Q24H  . dextromethorphan-guaiFENesin  1 tablet Oral BID  . enoxaparin (LOVENOX) injection  30 mg Subcutaneous Q24H  . feeding supplement  1 Container Oral TID BM  . fluticasone  2 spray Each Nare Daily  . insulin aspart  0-9 Units Subcutaneous TID WC  . ipratropium-albuterol  3 mL Nebulization TID  . levothyroxine  75 mcg Oral QAC breakfast  . loratadine  10 mg Oral Daily  . metoprolol tartrate  25 mg Oral BID  . predniSONE  40 mg Oral Q breakfast   Continuous Infusions:   LOS: 1 day    Time spent: 35 minutes    Loretha Stapler, MD [facility] Pager [phone]  If 7PM-7AM, please contact night-coverage [email or web address] Password [email or web address] 10/13/2016, 4:07 PM

## 2016-10-13 NOTE — NC FL2 (Signed)
[place] LEVEL OF CARE SCREENING TOOL     IDENTIFICATION  Patient Name: [name] [name] Birthdate: Dec 03, 1922 Sex: female Admission Date (Current Location): 10/11/2016  [facility] and [place] Number:  Herbalist and Address:  [facility],  [place] [address] [place]      Provider Number: [id number]  Attending Physician Name and Address:  Eber Jones, MD  Relative Name and Phone Number:       Current Level of Care: Hospital Recommended Level of Care: [place] Prior Approval Number:    Date Approved/Denied:   PASRR Number: [id number]  Discharge Plan: SNF    Current Diagnoses: Patient Active Problem List   Diagnosis Date Noted  . Sepsis ([place]) 10/12/2016  . Sinus tachycardia 10/12/2016  . HTN (hypertension) 10/12/2016  . COPD exacerbation ([place])   . CAP (community acquired pneumonia) 10/11/2016  . Colostomy in place [facility]) 12/10/2013    Orientation RESPIRATION BLADDER Height & Weight     Self, Time, Situation, Place  O2 (1L) Continent Weight: 196 lb 3.4 oz (89 kg) Height:  5\' 6"  (167.6 cm)  BEHAVIORAL SYMPTOMS/MOOD NEUROLOGICAL BOWEL NUTRITION STATUS      Continent Diet (Heart)  AMBULATORY STATUS COMMUNICATION OF NEEDS Skin   Extensive Assist Verbally Normal                       Personal Care Assistance Level of Assistance  Bathing, Dressing Bathing Assistance: Limited assistance   Dressing Assistance: Limited assistance     Functional Limitations Info             SPECIAL CARE FACTORS FREQUENCY  PT (By licensed PT), OT (By licensed OT)     PT Frequency: 5 OT Frequency: 5            Contractures      Additional Factors Info  Code Status, Allergies Code Status Info: DNR Allergies Info: Sulfa Antibiotics, Penicillins           Current Medications (10/13/2016):  This is the current hospital active medication list Current Facility-Administered Medications  Medication Dose  Route Frequency Provider Last Rate Last Dose  . acetaminophen (TYLENOL) tablet 650 mg  650 mg Oral Q6H PRN Lily Kocher, MD      . albuterol (PROVENTIL) (2.5 MG/3ML) 0.083% nebulizer solution 2.5 mg  2.5 mg Nebulization Q2H PRN Eber Jones, MD      . azithromycin [facility]) tablet 500 mg  500 mg Oral Daily Eber Jones, MD      . benzonatate (TESSALON) capsule 100 mg  100 mg Oral TID PRN Lily Kocher, MD   100 mg at 10/12/16 1829  . cefTRIAXone (ROCEPHIN) 1 g in dextrose 5 % 50 mL IVPB  1 g Intravenous Q24H Eber Jones, MD   1 g at 10/12/16 2130  . dextromethorphan-guaiFENesin (MUCINEX DM) 30-600 MG per 12 hr tablet 1 tablet  1 tablet Oral BID Eber Jones, MD      . enoxaparin (LOVENOX) injection 30 mg  30 mg Subcutaneous Q24H Lily Kocher, MD   30 mg at 10/13/16 1046  . feeding supplement (BOOST / RESOURCE BREEZE) liquid 1 Container  1 Container Oral TID BM Eber Jones, MD   1 Container at 10/13/16 1049  . fluticasone (FLONASE) 50 MCG/ACT nasal spray 2 spray  2 spray Each Nare Daily Lily Kocher, MD   2 spray at 10/13/16 1046  . guaiFENesin-dextromethorphan (ROBITUSSIN DM) 100-10 MG/5ML  syrup 5 mL  5 mL Oral Q4H PRN Eber Jones, MD   5 mL at 10/13/16 1046  . insulin aspart (novoLOG) injection 0-9 Units  0-9 Units Subcutaneous TID WC Eber Jones, MD   1 Units at 10/12/16 [id number]  . ipratropium-albuterol (DUONEB) 0.5-2.5 (3) MG/3ML nebulizer solution 3 mL  3 mL Nebulization TID Eber Jones, MD   3 mL at 10/13/16 0940  . levothyroxine (SYNTHROID, LEVOTHROID) tablet 75 mcg  75 mcg Oral QAC breakfast Lily Kocher, MD   75 mcg at 10/13/16 0824  . loratadine (CLARITIN) tablet 10 mg  10 mg Oral Daily Lily Kocher, MD   10 mg at 10/13/16 1047  . menthol-cetylpyridinium (CEPACOL) lozenge 3 mg  1 lozenge Oral PRN Eber Jones, MD   3 mg at 10/12/16 2022  . metoprolol tartrate (LOPRESSOR) tablet 25 mg  25 mg Oral BID Lily Kocher, MD    25 mg at 10/13/16 1047  . ondansetron (ZOFRAN) injection 4 mg  4 mg Intravenous Q6H PRN Lily Kocher, MD      . predniSONE (DELTASONE) tablet 40 mg  40 mg Oral Q breakfast Lily Kocher, MD   40 mg at 10/13/16 0800  . traMADol (ULTRAM) tablet 50 mg  50 mg Oral TID PRN Lily Kocher, MD   50 mg at 10/13/16 1050     Discharge Medications: Please see discharge summary for a list of discharge medications.  Relevant Imaging Results:  Relevant Lab Results:   Additional Information SSN: [id number]  Standley Brooking, LCSW

## 2016-10-14 LAB — BASIC METABOLIC PANEL
ANION GAP: 7 (ref 5–15)
BUN: 30 mg/dL — AB (ref 6–20)
CHLORIDE: 109 mmol/L (ref 101–111)
CO2: 23 mmol/L (ref 22–32)
Calcium: 7.8 mg/dL — ABNORMAL LOW (ref 8.9–10.3)
Creatinine, Ser: 1.11 mg/dL — ABNORMAL HIGH (ref 0.44–1.00)
GFR calc Af Amer: 49 mL/min — ABNORMAL LOW (ref >60)
GFR calc non Af Amer: 42 mL/min — ABNORMAL LOW (ref >60)
Glucose, Bld: 94 mg/dL (ref 65–99)
POTASSIUM: 3.7 mmol/L (ref 3.5–5.1)
SODIUM: 139 mmol/L (ref 135–145)

## 2016-10-14 LAB — GLUCOSE, CAPILLARY
GLUCOSE-CAPILLARY: 79 mg/dL (ref 65–99)
GLUCOSE-CAPILLARY: 131 mg/dL — AB (ref 65–99)
GLUCOSE-CAPILLARY: 183 mg/dL — AB (ref 65–99)

## 2016-10-14 LAB — LEGIONELLA PNEUMOPHILA SEROGP 1 UR AG: L. pneumophila Serogp 1 Ur Ag: NEGATIVE

## 2016-10-14 MED ORDER — ENOXAPARIN SODIUM 40 MG/0.4ML ~~LOC~~ SOLN
40.0000 mg | SUBCUTANEOUS | Status: DC
  Administered 2016-10-14 – 2016-10-16 (×3): 40 mg via SUBCUTANEOUS
  Filled 2016-10-14 (×3): qty 0.4

## 2016-10-14 MED ORDER — ALBUTEROL SULFATE (2.5 MG/3ML) 0.083% IN NEBU: 2.5000 mg | INHALATION_SOLUTION | RESPIRATORY_TRACT | Status: DC | PRN

## 2016-10-14 NOTE — Progress Notes (Signed)
PT Cancellation Note  Patient Details Name: [name] [name] MRN: [id number] DOB: Jul 03, 1923   Cancelled Treatment:     pt in recliner eating lunch, c/o fatigue "just went to the bathroom".  Pt requesting to rest after she eats.  Per chart review, pt plans to return to her ALF  [facility].   Rica Koyanagi  PTA [facility]  Acute  Rehab Pager      [phone]

## 2016-10-14 NOTE — Progress Notes (Signed)
PROGRESS NOTE    [name] [name]  [id number] DOB: 08-24-1923 DOA: 10/11/2016 PCP: Mathews Argyle, MD    Brief Narrative:  [name] [name] is a 83 y.o. woman with a history of dementia, endometrial cancer, colostomy, HTN, and hypothyroidism who presents to the ED via EMS after being referred from her assisted living facility.  She reports 4-5 days of dry cough, weakness, decreased PO intake, and subjective fevers.  She reports multiple sick contacts at her ALF, but she is not certain that any of them have been diagnosed with influenza.  She received the influenza vaccine this year.  She saw her PCP (or partner) yesterday and was given a prescription for Avelox and tessalon perles.  She has not improved.  ED Course: Upon arrival here, the ED attending was concerned for active wheezing with decreased breath sounds bilaterally.  The patient also demonstrated a new oxygen requirement, desatting to the mid 80's with ambulation on RA.  She has maintained O2 sats 96-97% with 2L [place].  Patient has received breathing treatments, IV solumedrol, and IV levaquin.  Flu screen is pending.  Hospitalist asked to place in observation.  Lactic acid level is elevated and rising.  She received 1L of NS earlier and was given additional 1 liter. Chest xray does not show acute infiltrate but she has changes consistent with COPD. Troponin negative.  BUN and creatinine elevated; baseline unclear. She has persistent tachycardia (which may now be influenced by the IV steroids and albuterol), but no chest pain.  Patient was transitioned to Ceftriaxone and Azithromycin during hospitalization.  Respiratory virus panel positive for RSV.  Patient still having significant wheeze and coughing.    Assessment & Plan:   Principal Problem:   Sepsis ([place]) Active Problems:   CAP (community acquired pneumonia)   Sinus tachycardia   HTN (hypertension)   COPD exacerbation (HCC)   Sepsis secondary to CAP with acute hypoxic  respiratory failure.  Possible mild COPD exacerbation. --Place on telemetry with continuous pulse oximetry --Wean oxygen when tolerated --Change levaquin to azithromycin and ceftriaxone -- can d/c azithromycin and ceftriaxone today as only wheezing on exam and no egophony --Prednisone 40mg  daily --Check urine legionella antigen --Blood cultures pending --Flu screen negative -- respiratory panel + for RSV -- lactic acid level of 1.3 -- Procalcitonin of 0.32  Possible AKI, baseline unclear -- improved Cr -- repeat BMP in am -- monitor BP -- restart bumex at home dosage  Hypothyroidism --Continue home dose of levothyroxine  HTN --Metoprolol   DVT prophylaxis: Lovenox Code Status: DNR Family Communication: patient alone in room but did discuss with her POA (who is her niece) Disposition Plan: To be determined will likely d/c back to previous facility when medically stably   Consultants:   PT  Procedures:   None  Antimicrobials:   Levaquin 1/10  Azithromycin 1/11> 1/12  Ceftriaxone 1/10> 1/12   Subjective: Patient is sitting in bed.  Did eat a bit of food yesterday and this morning but is drinking more than she is eating.  She says the breathing treatments are helping her breathing and that when it is time for them she feels a tightness in her chest.  Cough has much improved.  Still wearing about 1L of oxygen via [place].  Objective: Vitals:   10/14/16 0756 10/14/16 1100 10/14/16 1338 10/14/16 1400  BP:  (!) 146/62  (!) 179/71  Pulse:  86  89  Resp:    16  Temp:    98.1 F (36.7 C)  TempSrc:    Oral  SpO2: 94%  94% 97%  Weight:      Height:        Intake/Output Summary (Last 24 hours) at 10/14/16 1402 Last data filed at 10/14/16 1100  Gross per 24 hour  Intake              290 ml  Output              460 ml  Net             -170 ml   Filed Weights   10/12/16 0146  Weight: 89 kg (196 lb 3.4 oz)    Examination:  General exam: Appears calm and  comfortable  Respiratory system: Diffuse wheezing on expiration noted, no increased work of breathing, no accessory muscle use Cardiovascular system: S1 & S2 heard, RRR. No JVD, murmurs, rubs, gallops or clicks. No pedal edema. Gastrointestinal system: Abdomen is nondistended, soft and nontender. No organomegaly or masses felt. Normal bowel sounds heard. Central nervous system: Alert and oriented. No focal neurological deficits. Extremities: knees and legs painful with palpation bilaterally, left arm prosthesis noted Skin: No rashes, lesions or ulcers Psychiatry: Judgement and insight appear normal. Mood & affect appropriate.     Data Reviewed: I have personally reviewed following labs and imaging studies  CBC:  Recent Labs Lab 10/11/16 1953 10/12/16 0557  WBC 10.6* 14.9*  NEUTROABS 8.4*  --   HGB 11.8* 10.1*  HCT 35.8* 31.2*  MCV 89.7 89.7  PLT 216 0000000   Basic Metabolic Panel:  Recent Labs Lab 10/11/16 1953 10/12/16 0557 10/13/16 1056 10/14/16 0454  NA 137 134* 138 139  K 4.3 4.1 3.7 3.7  CL 100* 105 108 109  CO2 26 21* 21* 23  GLUCOSE 106* 187* 123* 94  BUN 27* 25* 30* 30*  CREATININE 1.64* 1.49* 1.33* 1.11*  CALCIUM 8.5* 7.4* 7.7* 7.8*   GFR: Estimated Creatinine Clearance: 37.1 mL/min (by C-G formula based on SCr of 1.11 mg/dL (H)). Liver Function Tests:  Recent Labs Lab 10/11/16 1953  AST 29  ALT 18  ALKPHOS 72  BILITOT 0.8  PROT 7.0  ALBUMIN 3.6   No results for input(s): LIPASE, AMYLASE in the last 168 hours. No results for input(s): AMMONIA in the last 168 hours. Coagulation Profile: No results for input(s): INR, PROTIME in the last 168 hours. Cardiac Enzymes:  Recent Labs Lab 10/11/16 2017  TROPONINI <0.03   BNP (last 3 results) No results for input(s): PROBNP in the last 8760 hours. HbA1C: No results for input(s): HGBA1C in the last 72 hours. CBG:  Recent Labs Lab 10/13/16 1121 10/13/16 1739 10/13/16 2140 10/14/16 0732  10/14/16 1206  GLUCAP 186* 162* 119* 79 131*   Lipid Profile: No results for input(s): CHOL, HDL, LDLCALC, TRIG, CHOLHDL, LDLDIRECT in the last 72 hours. Thyroid Function Tests: No results for input(s): TSH, T4TOTAL, FREET4, T3FREE, THYROIDAB in the last 72 hours. Anemia Panel: No results for input(s): VITAMINB12, FOLATE, FERRITIN, TIBC, IRON, RETICCTPCT in the last 72 hours. Sepsis Labs:  Recent Labs Lab 10/11/16 2043 10/11/16 2325 10/12/16 0244 10/12/16 0557  PROCALCITON  --   --  0.32  --   LATICACIDVEN 2.78* 3.59* 4.2* 1.3    Recent Results (from the past 240 hour(s))  MRSA PCR Screening     Status: None   Collection Time: 10/12/16  2:29 AM  Result Value Ref Range Status   MRSA by PCR NEGATIVE NEGATIVE Final  Comment:        The GeneXpert MRSA Assay (FDA approved for NASAL specimens only), is one component of a comprehensive MRSA colonization surveillance program. It is not intended to diagnose MRSA infection nor to guide or monitor treatment for MRSA infections.   Culture, blood (routine x 2) Call MD if unable to obtain prior to antibiotics being given     Status: None (Preliminary result)   Collection Time: 10/12/16  5:57 AM  Result Value Ref Range Status   Specimen Description BLOOD RIGHT ARM  Final   Special Requests BOTTLES DRAWN AEROBIC AND ANAEROBIC 5CC  Final   Culture   Final    NO GROWTH 1 DAY Performed at [facility]    Report Status PENDING  Incomplete  Culture, blood (routine x 2) Call MD if unable to obtain prior to antibiotics being given     Status: None (Preliminary result)   Collection Time: 10/12/16  5:57 AM  Result Value Ref Range Status   Specimen Description BLOOD RIGHT HAND  Final   Special Requests IN PEDIATRIC BOTTLE [place]  Final   Culture   Final    NO GROWTH 1 DAY Performed at [facility]    Report Status PENDING  Incomplete  Respiratory Panel by PCR     Status: Abnormal   Collection Time: 10/12/16  1:00 PM   Result Value Ref Range Status   Adenovirus NOT DETECTED NOT DETECTED Final   Coronavirus 229E NOT DETECTED NOT DETECTED Final   Coronavirus HKU1 NOT DETECTED NOT DETECTED Final   Coronavirus NL63 NOT DETECTED NOT DETECTED Final   Coronavirus OC43 NOT DETECTED NOT DETECTED Final   Metapneumovirus NOT DETECTED NOT DETECTED Final   Rhinovirus / Enterovirus NOT DETECTED NOT DETECTED Final   Influenza A NOT DETECTED NOT DETECTED Final   Influenza B NOT DETECTED NOT DETECTED Final   Parainfluenza Virus 1 NOT DETECTED NOT DETECTED Final   Parainfluenza Virus 2 NOT DETECTED NOT DETECTED Final   Parainfluenza Virus 3 NOT DETECTED NOT DETECTED Final   Parainfluenza Virus 4 NOT DETECTED NOT DETECTED Final   Respiratory Syncytial Virus DETECTED (A) NOT DETECTED Final    Comment: CRITICAL RESULT CALLED TO, READ BACK BY AND VERIFIED WITH: Marrian Salvage RN 15:15 10/13/16 ([email or web address])    Bordetella pertussis NOT DETECTED NOT DETECTED Final   Chlamydophila pneumoniae NOT DETECTED NOT DETECTED Final   Mycoplasma pneumoniae NOT DETECTED NOT DETECTED Final    Comment: Performed at [facility]         Radiology Studies: No results found.      Scheduled Meds: . bumetanide  2 mg Oral Daily  . dextromethorphan-guaiFENesin  1 tablet Oral BID  . enoxaparin (LOVENOX) injection  40 mg Subcutaneous Q24H  . feeding supplement  1 Container Oral TID BM  . fluticasone  2 spray Each Nare Daily  . insulin aspart  0-9 Units Subcutaneous TID WC  . ipratropium-albuterol  3 mL Nebulization TID  . levothyroxine  75 mcg Oral QAC breakfast  . loratadine  10 mg Oral Daily  . metoprolol tartrate  25 mg Oral BID  . potassium chloride  40 mEq Oral Daily  . predniSONE  40 mg Oral Q breakfast   Continuous Infusions:   LOS: 2 days    Time spent: 25 minutes    Loretha Stapler, MD [facility] Pager [phone]  If 7PM-7AM, please contact night-coverage [email or web address] Password [email or web address] 10/14/2016,  2:02 PM

## 2016-10-14 NOTE — Plan of Care (Signed)
Problem: Education: Goal: Knowledge of [facility] Education information/materials will improve Outcome: Completed/Met Date Met: 10/14/16 Discussed continued plan of care. Pt had questions regarding length of illness specifically. Education provided

## 2016-10-14 NOTE — Progress Notes (Signed)
CSW received call from [place] at [facility] who came out to assess patient & states that they would be able to take patient back at discharge. Anticipating possible discharge Sunday. Patient's niece, Eustaquio Maize made aware.   CSW confirmed with Shawn at [facility] that they would be able to take patient over the weekend if ready.   Please call weekend CSW (ph#: [phone]) to facilitate discharge.      Raynaldo Opitz, [facility] Clinical Social Worker cell #: [phone]

## 2016-10-15 LAB — GLUCOSE, CAPILLARY
Glucose-Capillary: 83 mg/dL (ref 65–99)
Glucose-Capillary: 77 mg/dL (ref 65–99)
Glucose-Capillary: 135 mg/dL — ABNORMAL HIGH (ref 65–99)
Glucose-Capillary: 146 mg/dL — ABNORMAL HIGH (ref 65–99)

## 2016-10-15 LAB — CBC WITH DIFFERENTIAL/PLATELET
Basophils Absolute: 0 10*3/uL (ref 0.0–0.1)
Basophils Relative: 0 %
EOS PCT: 0 %
Eosinophils Absolute: 0 10*3/uL (ref 0.0–0.7)
HCT: 33.2 % — ABNORMAL LOW (ref 36.0–46.0)
HEMOGLOBIN: 10.8 g/dL — AB (ref 12.0–15.0)
LYMPHS ABS: 1.8 10*3/uL (ref 0.7–4.0)
LYMPHS PCT: 13 %
MCH: 28.4 pg (ref 26.0–34.0)
MCHC: 32.5 g/dL (ref 30.0–36.0)
MCV: 87.4 fL (ref 78.0–100.0)
MONOS PCT: 7 %
Monocytes Absolute: 1 10*3/uL (ref 0.1–1.0)
NEUTROS ABS: 10.8 10*3/uL — AB (ref 1.7–7.7)
Neutrophils Relative %: 80 %
Platelets: 209 10*3/uL (ref 150–400)
RBC: 3.8 MIL/uL — ABNORMAL LOW (ref 3.87–5.11)
RDW: 14.9 % (ref 11.5–15.5)
WBC: 13.6 10*3/uL — ABNORMAL HIGH (ref 4.0–10.5)

## 2016-10-15 LAB — BASIC METABOLIC PANEL
ANION GAP: 7 (ref 5–15)
BUN: 31 mg/dL — AB (ref 6–20)
CHLORIDE: 107 mmol/L (ref 101–111)
CO2: 24 mmol/L (ref 22–32)
Calcium: 8.1 mg/dL — ABNORMAL LOW (ref 8.9–10.3)
Creatinine, Ser: 1.25 mg/dL — ABNORMAL HIGH (ref 0.44–1.00)
GFR calc Af Amer: 42 mL/min — ABNORMAL LOW (ref >60)
GFR calc non Af Amer: 36 mL/min — ABNORMAL LOW (ref >60)
GLUCOSE: 87 mg/dL (ref 65–99)
POTASSIUM: 3.7 mmol/L (ref 3.5–5.1)
Sodium: 138 mmol/L (ref 135–145)

## 2016-10-15 MED ORDER — VITAMINS A & D EX OINT
TOPICAL_OINTMENT | CUTANEOUS | Status: AC
  Administered 2016-10-15: 22:00:00
  Filled 2016-10-15: qty 5

## 2016-10-15 MED ORDER — CEFUROXIME AXETIL 500 MG PO TABS
500.0000 mg | ORAL_TABLET | Freq: Two times a day (BID) | ORAL | Status: DC
  Administered 2016-10-15 – 2016-10-16 (×3): 500 mg via ORAL
  Filled 2016-10-15 (×4): qty 1

## 2016-10-15 NOTE — Progress Notes (Signed)
PROGRESS NOTE    [name] [name]  [id number] DOB: 01-07-1923 DOA: 10/11/2016 PCP: Mathews Argyle, MD    Brief Narrative:  [name] [name] is a 83 y.o. woman with a history of dementia, endometrial cancer, colostomy, HTN, and hypothyroidism who presents to the ED via EMS after being referred from her assisted living facility.  She reports 4-5 days of dry cough, weakness, decreased PO intake, and subjective fevers.  She reports multiple sick contacts at her ALF, but she is not certain that any of them have been diagnosed with influenza.  She received the influenza vaccine this year.  She saw her PCP (or partner) yesterday and was given a prescription for Avelox and tessalon perles.  She has not improved.  ED Course: Upon arrival here, the ED attending was concerned for active wheezing with decreased breath sounds bilaterally.  The patient also demonstrated a new oxygen requirement, desatting to the mid 80's with ambulation on RA.  She has maintained O2 sats 96-97% with 2L [place].  Patient has received breathing treatments, IV solumedrol, and IV levaquin.  Flu screen is pending.  Hospitalist asked to place in observation.  Lactic acid level is elevated and rising.  She received 1L of NS earlier and was given additional 1 liter. Chest xray does not show acute infiltrate but she has changes consistent with COPD. Troponin negative.  BUN and creatinine elevated; baseline unclear. She has persistent tachycardia (which may now be influenced by the IV steroids and albuterol), but no chest pain.  Patient was transitioned to Ceftriaxone and Azithromycin during hospitalization.  Respiratory virus panel positive for RSV.  Patient still having significant wheeze and coughing.    Assessment & Plan:   Principal Problem:   Sepsis ([place]) Active Problems:   CAP (community acquired pneumonia)   Sinus tachycardia   HTN (hypertension)   COPD exacerbation (HCC)   Sepsis secondary to CAP with acute hypoxic  respiratory failure.  Possible mild COPD exacerbation. --Place on telemetry with continuous pulse oximetry --Wean oxygen when tolerated --Change levaquin to azithromycin and ceftriaxone -- will start ceftin in place of ceftriaxone --Prednisone 40mg  daily --Urine legionella antigen negative --Blood cultures NG x 3 days --Flu screen negative -- respiratory panel + for RSV -- lactic acid level of 1.3 -- Procalcitonin of 0.32 - beginning to improve but still requiring supplemental O2  Possible AKI, baseline unclear -- restarted bumex yesterday - repeat BMP in am (slight increase in Cr today)  Hypothyroidism --Continue home dose of levothyroxine  HTN --Metoprolol   DVT prophylaxis: Lovenox Code Status: DNR Family Communication: no family bedside Disposition Plan: To be determined will likely d/c back to previous facility when medically stable, possibly in next 24-48 hours   Consultants:   PT  Procedures:   None  Antimicrobials:   Levaquin 1/10  Azithromycin 1/11> 1/12  Ceftriaxone 1/10> 1/12   Subjective: Patient sitting up in chair and just finished breakfast.  She voices she feels a bit better than yesterday but is no where close to her baseline.  She says her breathing feels as though its improved- mostly due to the breathing treatments.  She denies any chest pain, chest pressure.  Appetite is starting to come back.    Objective: Vitals:   10/15/16 1100 10/15/16 1127 10/15/16 1422 10/15/16 1447  BP:   (!) 155/61   Pulse:   81   Resp:   18   Temp:   97.9 F (36.6 C)   TempSrc:   Oral   SpO2:  97% 94% 93% 91%  Weight:      Height:        Intake/Output Summary (Last 24 hours) at 10/15/16 1502 Last data filed at 10/15/16 1400  Gross per 24 hour  Intake              360 ml  Output             3100 ml  Net            -2740 ml   Filed Weights   10/12/16 0146  Weight: 89 kg (196 lb 3.4 oz)    Examination:  General exam: Appears calm and  comfortable  Respiratory system: Diffuse wheezing on expiration noted with few crackles on left middle and lower lung fields, no increased work of breathing, no accessory muscle use Cardiovascular system: S1 & S2 heard, RRR. No JVD, murmurs, rubs, gallops or clicks. No pedal edema. Gastrointestinal system: Abdomen is nondistended, soft and nontender. No organomegaly or masses felt. Normal bowel sounds heard. Central nervous system: Alert and oriented. No focal neurological deficits. Extremities: knees and legs painful with palpation bilaterally, left arm prosthesis noted Skin: No rashes, lesions or ulcers Psychiatry: Judgement and insight appear normal. Mood & affect appropriate.     Data Reviewed: I have personally reviewed following labs and imaging studies  CBC:  Recent Labs Lab 10/11/16 1953 10/12/16 0557 10/15/16 0544  WBC 10.6* 14.9* 13.6*  NEUTROABS 8.4*  --  10.8*  HGB 11.8* 10.1* 10.8*  HCT 35.8* 31.2* 33.2*  MCV 89.7 89.7 87.4  PLT 216 181 [id number]   Basic Metabolic Panel:  Recent Labs Lab 10/11/16 1953 10/12/16 0557 10/13/16 1056 10/14/16 0454 10/15/16 0544  NA 137 134* 138 139 138  K 4.3 4.1 3.7 3.7 3.7  CL 100* 105 108 109 107  CO2 26 21* 21* 23 24  GLUCOSE 106* 187* 123* 94 87  BUN 27* 25* 30* 30* 31*  CREATININE 1.64* 1.49* 1.33* 1.11* 1.25*  CALCIUM 8.5* 7.4* 7.7* 7.8* 8.1*   GFR: Estimated Creatinine Clearance: 32.9 mL/min (by C-G formula based on SCr of 1.25 mg/dL (H)). Liver Function Tests:  Recent Labs Lab 10/11/16 1953  AST 29  ALT 18  ALKPHOS 72  BILITOT 0.8  PROT 7.0  ALBUMIN 3.6   No results for input(s): LIPASE, AMYLASE in the last 168 hours. No results for input(s): AMMONIA in the last 168 hours. Coagulation Profile: No results for input(s): INR, PROTIME in the last 168 hours. Cardiac Enzymes:  Recent Labs Lab 10/11/16 2017  TROPONINI <0.03   BNP (last 3 results) No results for input(s): PROBNP in the last 8760  hours. HbA1C: No results for input(s): HGBA1C in the last 72 hours. CBG:  Recent Labs Lab 10/14/16 0732 10/14/16 1206 10/14/16 2244 10/15/16 0748 10/15/16 1145  GLUCAP 79 131* 183* 83 77   Lipid Profile: No results for input(s): CHOL, HDL, LDLCALC, TRIG, CHOLHDL, LDLDIRECT in the last 72 hours. Thyroid Function Tests: No results for input(s): TSH, T4TOTAL, FREET4, T3FREE, THYROIDAB in the last 72 hours. Anemia Panel: No results for input(s): VITAMINB12, FOLATE, FERRITIN, TIBC, IRON, RETICCTPCT in the last 72 hours. Sepsis Labs:  Recent Labs Lab 10/11/16 2043 10/11/16 2325 10/12/16 0244 10/12/16 0557  PROCALCITON  --   --  0.32  --   LATICACIDVEN 2.78* 3.59* 4.2* 1.3    Recent Results (from the past 240 hour(s))  MRSA PCR Screening     Status: None   Collection Time: 10/12/16  2:29 AM  Result Value Ref Range Status   MRSA by PCR NEGATIVE NEGATIVE Final    Comment:        The GeneXpert MRSA Assay (FDA approved for NASAL specimens only), is one component of a comprehensive MRSA colonization surveillance program. It is not intended to diagnose MRSA infection nor to guide or monitor treatment for MRSA infections.   Culture, blood (routine x 2) Call MD if unable to obtain prior to antibiotics being given     Status: None (Preliminary result)   Collection Time: 10/12/16  5:57 AM  Result Value Ref Range Status   Specimen Description BLOOD RIGHT ARM  Final   Special Requests BOTTLES DRAWN AEROBIC AND ANAEROBIC 5CC  Final   Culture   Final    NO GROWTH 3 DAYS Performed at [facility]    Report Status PENDING  Incomplete  Culture, blood (routine x 2) Call MD if unable to obtain prior to antibiotics being given     Status: None (Preliminary result)   Collection Time: 10/12/16  5:57 AM  Result Value Ref Range Status   Specimen Description BLOOD RIGHT HAND  Final   Special Requests IN PEDIATRIC BOTTLE Deport  Final   Culture   Final    NO GROWTH 3  DAYS Performed at [facility]    Report Status PENDING  Incomplete  Respiratory Panel by PCR     Status: Abnormal   Collection Time: 10/12/16  1:00 PM  Result Value Ref Range Status   Adenovirus NOT DETECTED NOT DETECTED Final   Coronavirus 229E NOT DETECTED NOT DETECTED Final   Coronavirus HKU1 NOT DETECTED NOT DETECTED Final   Coronavirus NL63 NOT DETECTED NOT DETECTED Final   Coronavirus OC43 NOT DETECTED NOT DETECTED Final   Metapneumovirus NOT DETECTED NOT DETECTED Final   Rhinovirus / Enterovirus NOT DETECTED NOT DETECTED Final   Influenza A NOT DETECTED NOT DETECTED Final   Influenza B NOT DETECTED NOT DETECTED Final   Parainfluenza Virus 1 NOT DETECTED NOT DETECTED Final   Parainfluenza Virus 2 NOT DETECTED NOT DETECTED Final   Parainfluenza Virus 3 NOT DETECTED NOT DETECTED Final   Parainfluenza Virus 4 NOT DETECTED NOT DETECTED Final   Respiratory Syncytial Virus DETECTED (A) NOT DETECTED Final    Comment: CRITICAL RESULT CALLED TO, READ BACK BY AND VERIFIED WITH: Marrian Salvage RN 15:15 10/13/16 ([email or web address])    Bordetella pertussis NOT DETECTED NOT DETECTED Final   Chlamydophila pneumoniae NOT DETECTED NOT DETECTED Final   Mycoplasma pneumoniae NOT DETECTED NOT DETECTED Final    Comment: Performed at [facility]         Radiology Studies: No results found.      Scheduled Meds: . bumetanide  2 mg Oral Daily  . dextromethorphan-guaiFENesin  1 tablet Oral BID  . enoxaparin (LOVENOX) injection  40 mg Subcutaneous Q24H  . feeding supplement  1 Container Oral TID BM  . fluticasone  2 spray Each Nare Daily  . insulin aspart  0-9 Units Subcutaneous TID WC  . ipratropium-albuterol  3 mL Nebulization TID  . levothyroxine  75 mcg Oral QAC breakfast  . loratadine  10 mg Oral Daily  . metoprolol tartrate  25 mg Oral BID  . potassium chloride  40 mEq Oral Daily  . predniSONE  40 mg Oral Q breakfast   Continuous Infusions:   LOS: 3 days    Time spent: 25  minutes    Loretha Stapler, MD [facility]  Pager [phone]  If 7PM-7AM, please contact night-coverage [email or web address] Password [email or web address] 10/15/2016, 3:02 PM

## 2016-10-16 ENCOUNTER — Inpatient Hospital Stay (HOSPITAL_COMMUNITY): Payer: Medicare Other

## 2016-10-16 LAB — GLUCOSE, CAPILLARY
GLUCOSE-CAPILLARY: 93 mg/dL (ref 65–99)
GLUCOSE-CAPILLARY: 131 mg/dL — AB (ref 65–99)
GLUCOSE-CAPILLARY: 160 mg/dL — AB (ref 65–99)
Glucose-Capillary: 93 mg/dL (ref 65–99)

## 2016-10-16 LAB — LACTIC ACID, PLASMA
LACTIC ACID, VENOUS: 1.4 mmol/L (ref 0.5–1.9)
Lactic Acid, Venous: 1.9 mmol/L (ref 0.5–1.9)

## 2016-10-16 NOTE — Evaluation (Signed)
Clinical/Bedside Swallow Evaluation Patient Details  Name: Ladaria Pott MRN: [id number] Date of Birth: [birth year]  Today's Date: 10/16/2016 Time: SLP Start Time (ACUTE ONLY): [id number] SLP Stop Time (ACUTE ONLY): 1254 SLP Time Calculation (min) (ACUTE ONLY): 9 min  Past Medical History:  Past Medical History:  Diagnosis Date  . Anemia   . Arthritis   . CAD (coronary artery disease)   . Cancer [facility])    Endometrial  . Congenital disease    Left forearm  . Dementia   . Diverticulosis   . History of degenerative disc disease    Both knees  . Hypertension   . Osteoporosis    osteopenia  . Thyroid disease    Hypothyroidism   Past Surgical History:  Past Surgical History:  Procedure Laterality Date  . ABDOMINAL HYSTERECTOMY    . BOWEL RESECTION    . COLOSTOMY  [id number]  . COLOSTOMY    . OTHER SURGICAL HISTORY    . ROTATOR CUFF REPAIR  1990s   HPI:  [name] [name] a 83 y.o.woman with a history of dementia, endometrial cancer, colostomy, HTN, and hypothyroidism who presents to the ED via EMS after being referred from her assisted living facility. She reports 4-5 days of dry cough, weakness, decreased PO intake, and subjective fevers. She reports multiple sick contacts at her ALF, but she is not certain that any of them have been diagnosed with influenza. She received the influenza vaccine this year. She saw her PCP (or partner) yesterday and was given a prescription for Avelox and tessalon perles. She has not improved. Per notes pt with sepsis secondary to CAP with acute hypoxic respiratory failure. Possible mild COPD exacerbation.   Assessment / Plan / Recommendation Clinical Impression  SLP with BSE following pt incident with emesis and possible aspiration following medicines whole with thin liquids this am. Swallow difficulty felt to be episodic as PO trials this date without overt signs or symptoms of reduced airway protection. Pt denies regular or recurrent swallow issues. SLP  educated strategies for reducing aspiration risk. Recommend regular thin diet with medicines whole in puree. SLP to follow up to ensure diet tolerance.    Aspiration Risk  Mild aspiration risk    Diet Recommendation   Regular/ thin liquids  Medication Administration: Whole meds with puree    Other  Recommendations Oral Care Recommendations: Oral care BID   Follow up Recommendations 24 hour supervision/assistance (ALF)      Frequency and Duration min 1 x/week  1 week       Prognosis Prognosis for Safe Diet Advancement: Good      Swallow Study   General Date of Onset: 10/11/16 HPI: [name] [name] a 83 y.o.woman with a history of dementia, endometrial cancer, colostomy, HTN, and hypothyroidism who presents to the ED via EMS after being referred from her assisted living facility. She reports 4-5 days of dry cough, weakness, decreased PO intake, and subjective fevers. She reports multiple sick contacts at her ALF, but she is not certain that any of them have been diagnosed with influenza. She received the influenza vaccine this year. She saw her PCP (or partner) yesterday and was given a prescription for Avelox and tessalon perles. She has not improved. Per notes pt with sepsis secondary to CAP with acute hypoxic respiratory failure. Possible mild COPD exacerbation. Type of Study: Bedside Swallow Evaluation Diet Prior to this Study: NPO Temperature Spikes Noted: No Respiratory Status: Nasal cannula History of Recent Intubation: No Behavior/Cognition: Alert;Cooperative;Pleasant mood Oral Cavity Assessment:  Within Functional Limits Oral Cavity - Dentition: Adequate natural dentition Vision: Functional for self-feeding Self-Feeding Abilities: Able to feed self Patient Positioning: Upright in chair Baseline Vocal Quality: Normal Volitional Cough: Strong Volitional Swallow: Able to elicit    Oral/Motor/Sensory Function Overall Oral Motor/Sensory Function: Within functional limits    Ice Chips Ice chips: Not tested   Thin Liquid Thin Liquid: Within functional limits Presentation: Cup;Straw    Nectar Thick Nectar Thick Liquid: Not tested   Honey Thick Honey Thick Liquid: Not tested   Puree Puree: Within functional limits   Solid   GO   Solid: Within functional limits         Arvil Chaco MA, CCC-SLP Acute Care Speech Language Pathologist    Levi Aland 10/16/2016,1:05 PM

## 2016-10-16 NOTE — Progress Notes (Signed)
PROGRESS NOTE    [name] [name]  [id number] DOB: 02-Feb-1923 DOA: 10/11/2016 PCP: Mathews Argyle, MD    Brief Narrative:  [name] [name] is a 83 y.o. woman with a history of dementia, endometrial cancer, colostomy, HTN, and hypothyroidism who presents to the ED via EMS after being referred from her assisted living facility.  She reports 4-5 days of dry cough, weakness, decreased PO intake, and subjective fevers.  She reports multiple sick contacts at her ALF, but she is not certain that any of them have been diagnosed with influenza.  She received the influenza vaccine this year.  She saw her PCP (or partner) yesterday and was given a prescription for Avelox and tessalon perles.  She has not improved.  ED Course: Upon arrival here, the ED attending was concerned for active wheezing with decreased breath sounds bilaterally.  The patient also demonstrated a new oxygen requirement, desatting to the mid 80's with ambulation on RA.  She has maintained O2 sats 96-97% with 2L [place].  Patient has received breathing treatments, IV solumedrol, and IV levaquin.  Flu screen is pending.  Hospitalist asked to place in observation.  Lactic acid level is elevated and rising.  She received 1L of NS earlier and was given additional 1 liter. Chest xray does not show acute infiltrate but she has changes consistent with COPD. Troponin negative.  BUN and creatinine elevated; baseline unclear. She has persistent tachycardia (which may now be influenced by the IV steroids and albuterol), but no chest pain.  Patient was transitioned to Ceftriaxone and Azithromycin during hospitalization.  Respiratory virus panel positive for RSV.  Patient still having significant wheeze and coughing.    Assessment & Plan:   Principal Problem:   Sepsis ([place]) Active Problems:   CAP (community acquired pneumonia)   Sinus tachycardia   HTN (hypertension)   COPD exacerbation (HCC)   Sepsis secondary to CAP with acute hypoxic  respiratory failure.  Possible mild COPD exacerbation. --Place on telemetry with continuous pulse oximetry --Wean oxygen when tolerated --Change levaquin to azithromycin and ceftriaxone -- will start ceftin in place of ceftriaxone (need to continue for pneumonia) --Prednisone 40mg  daily --Urine legionella antigen negative --Blood cultures NG x 3 days --Flu screen negative -- respiratory panel + for RSV -- lactic acid level of 1.3 -- Procalcitonin of 0.32 - beginning to improve but still requiring supplemental O2 - chest xray today showing LLL infiltrate  Possible AKI, baseline unclear -- bumex restarted - repeat BMP in am  Hypothyroidism -- Continue home dose of levothyroxine  HTN -- Metoprolol   DVT prophylaxis: Lovenox Code Status: DNR Family Communication: no family bedside; discussed with patient's niece Beth Disposition Plan: To be determined will likely d/c back to previous facility when medically stable, possibly in next 24-48 hours   Consultants:   PT  Procedures:   None  Antimicrobials:   Levaquin 1/10  Azithromycin 1/11> 1/12  Ceftriaxone 1/10> 1/12  Ceftin 11/13>   Subjective: Patient standing at table this morning asking if she can leave the room.  She voices that she normally uses her feet to move herself in her wheelchair at her facility.  Still using 1L of [place] oxygen.  Cough is somewhat better.    Objective: Vitals:   10/15/16 2133 10/16/16 0518 10/16/16 0748 10/16/16 1518  BP: (!) 146/70 (!) 166/70  (!) 167/84  Pulse: 90 80  (!) 114  Resp: 18   18  Temp: 98 F (36.7 C) 97.8 F (36.6 C)  97.5 F (  36.4 C)  TempSrc: Oral Oral  Oral  SpO2: 94% 96% 97% 93%  Weight:      Height:        Intake/Output Summary (Last 24 hours) at 10/16/16 1556 Last data filed at 10/16/16 1252  Gross per 24 hour  Intake              220 ml  Output             1100 ml  Net             -880 ml   Filed Weights   10/12/16 0146  Weight: 89 kg (196 lb  3.4 oz)    Examination:  General exam: Appears calm and comfortable  Respiratory system: Diffuse wheezing on expiration noted with few crackles on left middle and lower lung fields, no increased work of breathing, no accessory muscle use Cardiovascular system: S1 & S2 heard, RRR. No JVD, murmurs, rubs, gallops or clicks. No pedal edema. Gastrointestinal system: Abdomen is nondistended, soft and nontender. No organomegaly or masses felt. Normal bowel sounds heard. Central nervous system: Alert and oriented. No focal neurological deficits. Extremities: knees and legs painful with palpation bilaterally, left arm prosthesis noted Skin: No rashes, lesions or ulcers Psychiatry: Judgement and insight appear normal. Mood & affect appropriate.     Data Reviewed: I have personally reviewed following labs and imaging studies  CBC:  Recent Labs Lab 10/11/16 1953 10/12/16 0557 10/15/16 0544  WBC 10.6* 14.9* 13.6*  NEUTROABS 8.4*  --  10.8*  HGB 11.8* 10.1* 10.8*  HCT 35.8* 31.2* 33.2*  MCV 89.7 89.7 87.4  PLT 216 181 [id number]   Basic Metabolic Panel:  Recent Labs Lab 10/11/16 1953 10/12/16 0557 10/13/16 1056 10/14/16 0454 10/15/16 0544  NA 137 134* 138 139 138  K 4.3 4.1 3.7 3.7 3.7  CL 100* 105 108 109 107  CO2 26 21* 21* 23 24  GLUCOSE 106* 187* 123* 94 87  BUN 27* 25* 30* 30* 31*  CREATININE 1.64* 1.49* 1.33* 1.11* 1.25*  CALCIUM 8.5* 7.4* 7.7* 7.8* 8.1*   GFR: Estimated Creatinine Clearance: 32.9 mL/min (by C-G formula based on SCr of 1.25 mg/dL (H)). Liver Function Tests:  Recent Labs Lab 10/11/16 1953  AST 29  ALT 18  ALKPHOS 72  BILITOT 0.8  PROT 7.0  ALBUMIN 3.6   No results for input(s): LIPASE, AMYLASE in the last 168 hours. No results for input(s): AMMONIA in the last 168 hours. Coagulation Profile: No results for input(s): INR, PROTIME in the last 168 hours. Cardiac Enzymes:  Recent Labs Lab 10/11/16 2017  TROPONINI <0.03   BNP (last 3 results) No  results for input(s): PROBNP in the last 8760 hours. HbA1C: No results for input(s): HGBA1C in the last 72 hours. CBG:  Recent Labs Lab 10/15/16 1145 10/15/16 1708 10/15/16 2133 10/16/16 0727 10/16/16 1135  GLUCAP 77 135* 146* 93 93   Lipid Profile: No results for input(s): CHOL, HDL, LDLCALC, TRIG, CHOLHDL, LDLDIRECT in the last 72 hours. Thyroid Function Tests: No results for input(s): TSH, T4TOTAL, FREET4, T3FREE, THYROIDAB in the last 72 hours. Anemia Panel: No results for input(s): VITAMINB12, FOLATE, FERRITIN, TIBC, IRON, RETICCTPCT in the last 72 hours. Sepsis Labs:  Recent Labs Lab 10/11/16 2043 10/11/16 2325 10/12/16 0244 10/12/16 0557  PROCALCITON  --   --  0.32  --   LATICACIDVEN 2.78* 3.59* 4.2* 1.3    Recent Results (from the past 240 hour(s))  MRSA PCR Screening  Status: None   Collection Time: 10/12/16  2:29 AM  Result Value Ref Range Status   MRSA by PCR NEGATIVE NEGATIVE Final    Comment:        The GeneXpert MRSA Assay (FDA approved for NASAL specimens only), is one component of a comprehensive MRSA colonization surveillance program. It is not intended to diagnose MRSA infection nor to guide or monitor treatment for MRSA infections.   Culture, blood (routine x 2) Call MD if unable to obtain prior to antibiotics being given     Status: None (Preliminary result)   Collection Time: 10/12/16  5:57 AM  Result Value Ref Range Status   Specimen Description BLOOD RIGHT ARM  Final   Special Requests BOTTLES DRAWN AEROBIC AND ANAEROBIC 5CC  Final   Culture   Final    NO GROWTH 4 DAYS Performed at [facility]    Report Status PENDING  Incomplete  Culture, blood (routine x 2) Call MD if unable to obtain prior to antibiotics being given     Status: None (Preliminary result)   Collection Time: 10/12/16  5:57 AM  Result Value Ref Range Status   Specimen Description BLOOD RIGHT HAND  Final   Special Requests IN PEDIATRIC BOTTLE [place]  Final    Culture   Final    NO GROWTH 4 DAYS Performed at [facility]    Report Status PENDING  Incomplete  Respiratory Panel by PCR     Status: Abnormal   Collection Time: 10/12/16  1:00 PM  Result Value Ref Range Status   Adenovirus NOT DETECTED NOT DETECTED Final   Coronavirus 229E NOT DETECTED NOT DETECTED Final   Coronavirus HKU1 NOT DETECTED NOT DETECTED Final   Coronavirus NL63 NOT DETECTED NOT DETECTED Final   Coronavirus OC43 NOT DETECTED NOT DETECTED Final   Metapneumovirus NOT DETECTED NOT DETECTED Final   Rhinovirus / Enterovirus NOT DETECTED NOT DETECTED Final   Influenza A NOT DETECTED NOT DETECTED Final   Influenza B NOT DETECTED NOT DETECTED Final   Parainfluenza Virus 1 NOT DETECTED NOT DETECTED Final   Parainfluenza Virus 2 NOT DETECTED NOT DETECTED Final   Parainfluenza Virus 3 NOT DETECTED NOT DETECTED Final   Parainfluenza Virus 4 NOT DETECTED NOT DETECTED Final   Respiratory Syncytial Virus DETECTED (A) NOT DETECTED Final    Comment: CRITICAL RESULT CALLED TO, READ BACK BY AND VERIFIED WITH: Marrian Salvage RN 15:15 10/13/16 ([email or web address])    Bordetella pertussis NOT DETECTED NOT DETECTED Final   Chlamydophila pneumoniae NOT DETECTED NOT DETECTED Final   Mycoplasma pneumoniae NOT DETECTED NOT DETECTED Final    Comment: Performed at [facility]         Radiology Studies: Dg Chest 2 View  Result Date: 10/16/2016 CLINICAL DATA:  Cough and shortness breath. EXAM: CHEST  2 VIEW COMPARISON:  Two-view chest x-ray 10/11/2016 FINDINGS: Heart size normal. New left lower lobe airspace disease is present. Emphysematous changes are noted. There slight increase in a diffuse interstitial pattern. Degenerative changes of the shoulders are worse on the right. IMPRESSION: 1. A left lower lobe pneumonia. 2. Emphysema with mild superimposed edema. Electronically Signed   By: San Morelle M.D.   On: 10/16/2016 13:59        Scheduled Meds: . bumetanide  2 mg Oral  Daily  . cefUROXime  500 mg Oral BID WC  . dextromethorphan-guaiFENesin  1 tablet Oral BID  . enoxaparin (LOVENOX) injection  40 mg Subcutaneous Q24H  . feeding  supplement  1 Container Oral TID BM  . fluticasone  2 spray Each Nare Daily  . insulin aspart  0-9 Units Subcutaneous TID WC  . ipratropium-albuterol  3 mL Nebulization TID  . levothyroxine  75 mcg Oral QAC breakfast  . loratadine  10 mg Oral Daily  . metoprolol tartrate  25 mg Oral BID  . potassium chloride  40 mEq Oral Daily  . predniSONE  40 mg Oral Q breakfast   Continuous Infusions:   LOS: 4 days    Time spent: 25 minutes    Loretha Stapler, MD [facility] Pager [phone]  If 7PM-7AM, please contact night-coverage [email or web address] Password [facility] 10/16/2016, 3:56 PM

## 2016-10-17 LAB — BASIC METABOLIC PANEL
Anion gap: 10 (ref 5–15)
BUN: 37 mg/dL — AB (ref 6–20)
CALCIUM: 8.3 mg/dL — AB (ref 8.9–10.3)
CO2: 25 mmol/L (ref 22–32)
Chloride: 106 mmol/L (ref 101–111)
Creatinine, Ser: 1.48 mg/dL — ABNORMAL HIGH (ref 0.44–1.00)
GFR calc Af Amer: 34 mL/min — ABNORMAL LOW (ref >60)
GFR, EST NON AFRICAN AMERICAN: 30 mL/min — AB (ref >60)
GLUCOSE: 80 mg/dL (ref 65–99)
Potassium: 4 mmol/L (ref 3.5–5.1)
Sodium: 141 mmol/L (ref 135–145)

## 2016-10-17 LAB — CULTURE, BLOOD (ROUTINE X 2)
CULTURE: NO GROWTH
CULTURE: NO GROWTH

## 2016-10-17 LAB — GLUCOSE, CAPILLARY
GLUCOSE-CAPILLARY: 199 mg/dL — AB (ref 65–99)
Glucose-Capillary: 85 mg/dL (ref 65–99)
Glucose-Capillary: 100 mg/dL — ABNORMAL HIGH (ref 65–99)

## 2016-10-17 LAB — MAGNESIUM: Magnesium: 2.2 mg/dL (ref 1.7–2.4)

## 2016-10-17 MED ORDER — CEFUROXIME AXETIL 500 MG PO TABS
500.0000 mg | ORAL_TABLET | Freq: Two times a day (BID) | ORAL | Status: DC
  Filled 2016-10-17: qty 1

## 2016-10-17 MED ORDER — GUAIFENESIN-DM 100-10 MG/5ML PO SYRP: 5.0000 mL | ORAL_SOLUTION | ORAL | 0 refills | Status: AC | PRN

## 2016-10-17 MED ORDER — CEFUROXIME AXETIL 500 MG PO TABS: 500.0000 mg | ORAL_TABLET | Freq: Every day | ORAL | 0 refills | Status: AC

## 2016-10-17 MED ORDER — CEFUROXIME AXETIL 500 MG PO TABS
500.0000 mg | ORAL_TABLET | Freq: Every day | ORAL | Status: DC
  Filled 2016-10-17: qty 1

## 2016-10-17 MED ORDER — DM-GUAIFENESIN ER 30-600 MG PO TB12: 1.0000 | ORAL_TABLET | Freq: Two times a day (BID) | ORAL | 0 refills | Status: AC

## 2016-10-17 MED ORDER — ENOXAPARIN SODIUM 30 MG/0.3ML ~~LOC~~ SOLN
30.0000 mg | SUBCUTANEOUS | Status: DC
  Administered 2016-10-17: 30 mg via SUBCUTANEOUS
  Filled 2016-10-17: qty 0.3

## 2016-10-17 MED ORDER — ALBUTEROL SULFATE HFA 108 (90 BASE) MCG/ACT IN AERS: 2.0000 | INHALATION_SPRAY | Freq: Four times a day (QID) | RESPIRATORY_TRACT | 2 refills | Status: DC | PRN

## 2016-10-17 MED ORDER — IPRATROPIUM-ALBUTEROL 0.5-2.5 (3) MG/3ML IN SOLN: 3.0000 mL | Freq: Three times a day (TID) | RESPIRATORY_TRACT | 0 refills | Status: AC

## 2016-10-17 NOTE — NC FL2 (Signed)
[place] LEVEL OF CARE SCREENING TOOL     IDENTIFICATION  Patient Name: [name] [name] Birthdate: 04-06-1923 Sex: female Admission Date (Current Location): 10/11/2016  [facility] and [place] Number:  Herbalist and Address:  [facility],  [place] [address], [place]      Provider Number: [id number]  Attending Physician Name and Address:  Eber Jones, MD  Relative Name and Phone Number:       Current Level of Care: Hospital Recommended Level of Care: [place] Prior Approval Number:    Date Approved/Denied:   PASRR Number: [id number]  Discharge Plan: Return to ALF    Current Diagnoses: Patient Active Problem List   Diagnosis Date Noted  . Sepsis ([place]) 10/12/2016  . Sinus tachycardia 10/12/2016  . HTN (hypertension) 10/12/2016  . COPD exacerbation ([place])   . CAP (community acquired pneumonia) 10/11/2016  . Colostomy in place [facility]) 12/10/2013    Orientation RESPIRATION BLADDER Height & Weight     Self, Time, Situation, Place  O2 (1L) Continent Weight: 196 lb 3.4 oz (89 kg) Height:  5\' 6"  (167.6 cm)  BEHAVIORAL SYMPTOMS/MOOD NEUROLOGICAL BOWEL NUTRITION STATUS      Continent Diet (Heart)  AMBULATORY STATUS COMMUNICATION OF NEEDS Skin   Extensive Assist Verbally Normal                       Personal Care Assistance Level of Assistance  Bathing, Dressing Bathing Assistance: Limited assistance   Dressing Assistance: Limited assistance     Functional Limitations Info             SPECIAL CARE FACTORS FREQUENCY  PT (By licensed PT), OT (By licensed OT)     Contractures      Additional Factors Info  Code Status, Allergies Code Status Info: DNR Allergies Info: Sulfa Antibiotics, Penicillins            Discharge Medications: TAKE these medications   acetaminophen 650 MG CR tablet Commonly known as:  TYLENOL Take 650 mg by mouth daily as needed for pain.  acetaminophen 325 MG  tablet Commonly known as:  TYLENOL Take 650 mg by mouth daily. At 2 PM  albuterol 108 (90 Base) MCG/ACT inhaler Commonly known as:  PROVENTIL HFA;VENTOLIN HFA Inhale 2 puffs into the lungs every 6 (six) hours as needed for wheezing or shortness of breath.  benzonatate 100 MG capsule Commonly known as:  TESSALON Take 100 mg by mouth 3 (three) times daily as needed for cough.  bumetanide 1 MG tablet Commonly known as:  BUMEX Take 2 mg by mouth daily.  cefUROXime 500 MG tablet Commonly known as:  CEFTIN Take 1 tablet (500 mg total) by mouth daily.  dextromethorphan-guaiFENesin 30-600 MG 12hr tablet Commonly known as:  MUCINEX DM Take 1 tablet by mouth 2 (two) times daily.  guaiFENesin-dextromethorphan 100-10 MG/5ML syrup Commonly known as:  ROBITUSSIN DM Take 5 mLs by mouth every 4 (four) hours as needed for cough.  fluticasone 50 MCG/ACT nasal spray Commonly known as:  FLONASE Place 2 sprays into both nostrils daily.  ipratropium-albuterol 0.5-2.5 (3) MG/3ML Soln Commonly known as:  DUONEB Take 3 mLs by nebulization 3 (three) times daily.  KLOR-CON 20 MEQ packet Generic drug:  potassium chloride Take by mouth 2 (two) times daily.  levothyroxine 75 MCG tablet Commonly known as:  SYNTHROID, LEVOTHROID Take 75 mcg by mouth daily before breakfast.  loratadine 10 MG tablet Commonly known as:  CLARITIN  Take 10 mg by mouth daily.  Melatonin 5 MG Tabs Take 1 tablet by mouth at bedtime.  metoprolol tartrate 25 MG tablet Commonly known as:  LOPRESSOR Take 25 mg by mouth 2 (two) times daily.  traMADol 50 MG tablet Commonly known as:  ULTRAM Take 50 mg by mouth as directed. Up to 3 times daily as needed  and at bedtime  triamcinolone 0.025 % cream Commonly known as:  KENALOG Apply 1 application topically 2 (two) times daily as needed (for rash).     Relevant Imaging Results:  Relevant Lab Results:   Additional [place] PT/OT to evaluate & treat as needed SSN:  [id number]  Standley Brooking, LCSW

## 2016-10-17 NOTE — Progress Notes (Signed)
Patient is set to discharge back to [facility] today. CSW confirmed with Shawn at [facility] that they are able to take patient back. Patient & niece, Eustaquio Maize made aware. Discharge packet given to RN, Joellen Jersey. Patient's niece to transport back to ALF.     Raynaldo Opitz, [facility] Clinical Social Worker cell #: [phone]

## 2016-10-17 NOTE — Discharge Summary (Signed)
Physician Discharge Summary  Karole Kouris [id number] DOB: Sep 22, 1923 DOA: 10/11/2016  PCP: Mathews Argyle, MD  Admit date: 10/11/2016 Discharge date: 10/17/2016  Admitted From: ALF  Disposition:  ALF  Recommendations for Outpatient Follow-up:  1. Follow up with PCP in 1-2 weeks 2. Discuss lung function tests and possible referral to pulmonology at PCP office 3. Consider allergy testing for chronic cough as well as trial of PPI or H2 blocker; if still coughing consider referral to ENT physician 4. Please obtain BMP/CBC in one week  5. Continue antibiotics as prescribed  Home Health: No Equipment/Devices: Wheelchair per baseline  Discharge Condition: Stable CODE STATUS: DNR Diet recommendation: Heart Healthy    Brief/Interim Summary: [name] [name] a 83 y.o.woman with a history of dementia, endometrial cancer, colostomy, HTN, and hypothyroidism who presents to the ED via EMS after being referred from her assisted living facility. She reports 4-5 days of dry cough, weakness, decreased PO intake, and subjective fevers. She reports multiple sick contacts at her ALF, but she is not certain that any of them have been diagnosed with influenza. She received the influenza vaccine this year. She saw her PCP (or partner) yesterday and was given a prescription for Avelox and tessalon perles. She has not improved.  ED Course:Upon arrival here, the ED attending was concerned for active wheezing with decreased breath sounds bilaterally. The patient also demonstrated a new oxygen requirement, desatting to the mid 80's with ambulation on RA. She has maintained O2 sats 96-97% with 2L [place]. Patient has received breathing treatments, IV solumedrol, and IV levaquin. Flu screen is pending. Hospitalist asked to place in observation.  Lactic acid level is elevated and rising. She received 1L of NS earlier and was given additional 1 liter. Chest xray does not show acute infiltrate but she has changes  consistent with COPD. Troponin negative. BUN and creatinine elevated; baseline unclear. She has persistent tachycardia (which may now be influenced by the IV steroids and albuterol), but no chest pain.  Patient was transitioned to Ceftriaxone and Azithromycin during hospitalization.  Respiratory virus panel positive for RSV.  Patient still having significant wheeze and coughing and respiratory status did not improve by 10/16/16 and repeat chest xray showed pneumonia.  Patient was continued on antibiotics but transitioned to Ceftin for outpatient treatment.  Patient was on room air and was able to ambulate in wheelchair per her baseline on day of discharge.  Discussed with patient's POA follow up for patient's chronic cough.  Discharge Diagnoses:  Principal Problem:   Sepsis ([place]) Active Problems:   CAP (community acquired pneumonia)   Sinus tachycardia   HTN (hypertension)   COPD exacerbation [facility])    Discharge Instructions  Discharge Instructions    Call MD for:  difficulty breathing, headache or visual disturbances    Complete by:  As directed    Call MD for:  extreme fatigue    Complete by:  As directed    Call MD for:  hives    Complete by:  As directed    Call MD for:  persistant dizziness or light-headedness    Complete by:  As directed    Call MD for:  persistant nausea and vomiting    Complete by:  As directed    Call MD for:  severe uncontrolled pain    Complete by:  As directed    Call MD for:  temperature >100.4    Complete by:  As directed    Diet - low sodium heart healthy  Complete by:  As directed    Discharge instructions    Complete by:  As directed    Schedule follow up with PCP- ask about lung function tests and possibly about referral to allergist or pulmonologist.  Consider trial of reflux medication.  Discussed referral to ENT.   Increase activity slowly    Complete by:  As directed    Walker     Complete by:  As directed      Allergies as of  10/17/2016      Reactions   Sulfa Antibiotics    Listed on MAR   Penicillins Itching, Rash   Tolerates ceftriaxone and cefdinir       Medication List    STOP taking these medications   moxifloxacin 400 MG tablet Commonly known as:  AVELOX     TAKE these medications   acetaminophen 650 MG CR tablet Commonly known as:  TYLENOL Take 650 mg by mouth daily as needed for pain.   acetaminophen 325 MG tablet Commonly known as:  TYLENOL Take 650 mg by mouth daily. At 2 PM   albuterol 108 (90 Base) MCG/ACT inhaler Commonly known as:  PROVENTIL HFA;VENTOLIN HFA Inhale 2 puffs into the lungs every 6 (six) hours as needed for wheezing or shortness of breath.   benzonatate 100 MG capsule Commonly known as:  TESSALON Take 100 mg by mouth 3 (three) times daily as needed for cough.   bumetanide 1 MG tablet Commonly known as:  BUMEX Take 2 mg by mouth daily.   cefUROXime 500 MG tablet Commonly known as:  CEFTIN Take 1 tablet (500 mg total) by mouth daily.   dextromethorphan-guaiFENesin 30-600 MG 12hr tablet Commonly known as:  MUCINEX DM Take 1 tablet by mouth 2 (two) times daily.   guaiFENesin-dextromethorphan 100-10 MG/5ML syrup Commonly known as:  ROBITUSSIN DM Take 5 mLs by mouth every 4 (four) hours as needed for cough.   fluticasone 50 MCG/ACT nasal spray Commonly known as:  FLONASE Place 2 sprays into both nostrils daily.   ipratropium-albuterol 0.5-2.5 (3) MG/3ML Soln Commonly known as:  DUONEB Take 3 mLs by nebulization 3 (three) times daily.   KLOR-CON 20 MEQ packet Generic drug:  potassium chloride Take by mouth 2 (two) times daily.   levothyroxine 75 MCG tablet Commonly known as:  SYNTHROID, LEVOTHROID Take 75 mcg by mouth daily before breakfast.   loratadine 10 MG tablet Commonly known as:  CLARITIN Take 10 mg by mouth daily.   Melatonin 5 MG Tabs Take 1 tablet by mouth at bedtime.   metoprolol tartrate 25 MG tablet Commonly known as:  LOPRESSOR Take  25 mg by mouth 2 (two) times daily.   traMADol 50 MG tablet Commonly known as:  ULTRAM Take 50 mg by mouth as directed. Up to 3 times daily as needed  and at bedtime   triamcinolone 0.025 % cream Commonly known as:  KENALOG Apply 1 application topically 2 (two) times daily as needed (for rash).      Contact information for after-discharge care    Destination    [facility] .   Specialty:  Assisted Living Facility Contact information: [address]. [place] [address] [id number]             Allergies  Allergen Reactions  . Sulfa Antibiotics     Listed on MAR  . Penicillins Itching and Rash    Tolerates ceftriaxone and cefdinir     Consultations:  PT  Social Work  SLP   Procedures/Studies: Dg Chest 2 View  Result Date: 10/16/2016 CLINICAL DATA:  Cough and shortness breath. EXAM: CHEST  2 VIEW COMPARISON:  Two-view chest x-ray 10/11/2016 FINDINGS: Heart size normal. New left lower lobe airspace disease is present. Emphysematous changes are noted. There slight increase in a diffuse interstitial pattern. Degenerative changes of the shoulders are worse on the right. IMPRESSION: 1. A left lower lobe pneumonia. 2. Emphysema with mild superimposed edema. Electronically Signed   By: San Morelle M.D.   On: 10/16/2016 13:59   Dg Chest 2 View  Result Date: 10/11/2016 CLINICAL DATA:  Short of breath EXAM: CHEST  2 VIEW COMPARISON:  10/10/2016 FINDINGS: COPD with. Pulmonary hyperinflation with apical scarring bilaterally. Negative for heart failure or pneumonia. Improvement in vascular congestion since the prior study. No pleural effusion or mass lesion. IMPRESSION: COPD without acute abnormality. Electronically Signed   By: Franchot Gallo M.D.   On: 10/11/2016 19:48   Dg Chest 2 View  Result Date: 10/10/2016 CLINICAL DATA:  Cough, shortness of breath, history of coronary artery disease, former smoker. EXAM: CHEST  2 VIEW  COMPARISON:  PA and lateral chest x-ray of November 13, 2015. FINDINGS: The lungs are mildly hyperinflated with hemidiaphragm flattening. There is no focal infiltrate. There is no pleural effusion. The heart is mildly enlarged and the central pulmonary vascularity is prominent the pulmonary interstitial markings are mildly increased overall. There is calcification in the wall of the aortic arch. There is stable biapical pleural thickening. There is mild multilevel degenerative disc disease of the thoracic spine. There are chronic degenerative changes of the right shoulder. IMPRESSION: COPD. Superimposed low-grade CHF with mild interstitial edema. No alveolar pneumonia. Thoracic aortic atherosclerosis. Electronically Signed   By: David  Martinique M.D.   On: 10/10/2016 15:13      Subjective: Patient feeling well this morning.  She reports that she was able to push herself around in her wheelchair most of the day yesterday and feels that really helped with her improvement.  Says she feels ready to go home today.  Discussed with patient's POA.  Patient will need to continue antibiotics outpatient.  Discharge Exam: Vitals:   10/17/16 0653 10/17/16 1241  BP: (!) 180/77 (!) 156/62  Pulse: 80 69  Resp: 18 18  Temp: 98.1 F (36.7 C) 98.3 F (36.8 C)   Vitals:   10/16/16 2033 10/17/16 0653 10/17/16 0830 10/17/16 1241  BP: 137/82 (!) 180/77  (!) 156/62  Pulse: 100 80  69  Resp: 18 18  18   Temp: 97.6 F (36.4 C) 98.1 F (36.7 C)  98.3 F (36.8 C)  TempSrc: Oral Oral  Oral  SpO2: 96% 97% 97% 93%  Weight:      Height:        General: Pt is alert, awake, not in acute distress Cardiovascular: RRR, S1/S2 +, no rubs, no gallops Respiratory: few rales at left lung base, no wheezing, good air movement Abdominal: Soft, NT, ND, bowel sounds + Extremities: no edema, no cyanosis, arm prosthesis on left    The results of significant diagnostics from this hospitalization (including imaging, microbiology,  ancillary and laboratory) are listed below for reference.     Microbiology: Recent Results (from the past 240 hour(s))  MRSA PCR Screening     Status: None   Collection Time: 10/12/16  2:29 AM  Result Value Ref Range Status   MRSA by PCR NEGATIVE NEGATIVE Final    Comment:  The GeneXpert MRSA Assay (FDA approved for NASAL specimens only), is one component of a comprehensive MRSA colonization surveillance program. It is not intended to diagnose MRSA infection nor to guide or monitor treatment for MRSA infections.   Culture, blood (routine x 2) Call MD if unable to obtain prior to antibiotics being given     Status: None   Collection Time: 10/12/16  5:57 AM  Result Value Ref Range Status   Specimen Description BLOOD RIGHT ARM  Final   Special Requests BOTTLES DRAWN AEROBIC AND ANAEROBIC 5CC  Final   Culture   Final    NO GROWTH 5 DAYS Performed at [facility]    Report Status 10/17/2016 FINAL  Final  Culture, blood (routine x 2) Call MD if unable to obtain prior to antibiotics being given     Status: None   Collection Time: 10/12/16  5:57 AM  Result Value Ref Range Status   Specimen Description BLOOD RIGHT HAND  Final   Special Requests IN PEDIATRIC BOTTLE [place]  Final   Culture   Final    NO GROWTH 5 DAYS Performed at [facility]    Report Status 10/17/2016 FINAL  Final  Respiratory Panel by PCR     Status: Abnormal   Collection Time: 10/12/16  1:00 PM  Result Value Ref Range Status   Adenovirus NOT DETECTED NOT DETECTED Final   Coronavirus 229E NOT DETECTED NOT DETECTED Final   Coronavirus HKU1 NOT DETECTED NOT DETECTED Final   Coronavirus NL63 NOT DETECTED NOT DETECTED Final   Coronavirus OC43 NOT DETECTED NOT DETECTED Final   Metapneumovirus NOT DETECTED NOT DETECTED Final   Rhinovirus / Enterovirus NOT DETECTED NOT DETECTED Final   Influenza A NOT DETECTED NOT DETECTED Final   Influenza B NOT DETECTED NOT DETECTED Final   Parainfluenza Virus  1 NOT DETECTED NOT DETECTED Final   Parainfluenza Virus 2 NOT DETECTED NOT DETECTED Final   Parainfluenza Virus 3 NOT DETECTED NOT DETECTED Final   Parainfluenza Virus 4 NOT DETECTED NOT DETECTED Final   Respiratory Syncytial Virus DETECTED (A) NOT DETECTED Final    Comment: CRITICAL RESULT CALLED TO, READ BACK BY AND VERIFIED WITH: Marrian Salvage RN 15:15 10/13/16 ([email or web address])    Bordetella pertussis NOT DETECTED NOT DETECTED Final   Chlamydophila pneumoniae NOT DETECTED NOT DETECTED Final   Mycoplasma pneumoniae NOT DETECTED NOT DETECTED Final    Comment: Performed at [facility]: BNP (last 3 results)  Recent Labs  10/11/16 2017 10/13/16 1056  BNP 174.0* 123XX123*   Basic Metabolic Panel:  Recent Labs Lab 10/12/16 0557 10/13/16 1056 10/14/16 0454 10/15/16 0544 10/17/16 0550  NA 134* 138 139 138 141  K 4.1 3.7 3.7 3.7 4.0  CL 105 108 109 107 106  CO2 21* 21* 23 24 25   GLUCOSE 187* 123* 94 87 80  BUN 25* 30* 30* 31* 37*  CREATININE 1.49* 1.33* 1.11* 1.25* 1.48*  CALCIUM 7.4* 7.7* 7.8* 8.1* 8.3*  MG  --   --   --   --  2.2   Liver Function Tests:  Recent Labs Lab 10/11/16 1953  AST 29  ALT 18  ALKPHOS 72  BILITOT 0.8  PROT 7.0  ALBUMIN 3.6   No results for input(s): LIPASE, AMYLASE in the last 168 hours. No results for input(s): AMMONIA in the last 168 hours. CBC:  Recent Labs Lab 10/11/16 1953 10/12/16 0557 10/15/16 0544  WBC 10.6* 14.9* 13.6*  NEUTROABS  8.4*  --  10.8*  HGB 11.8* 10.1* 10.8*  HCT 35.8* 31.2* 33.2*  MCV 89.7 89.7 87.4  PLT 216 181 209   Cardiac Enzymes:  Recent Labs Lab 10/11/16 2017  TROPONINI <0.03   BNP: Invalid input(s): POCBNP CBG:  Recent Labs Lab 10/16/16 1135 10/16/16 1624 10/16/16 2032 10/17/16 0757 10/17/16 1226  GLUCAP 93 131* 160* 85 100*   D-Dimer No results for input(s): DDIMER in the last 72 hours. Hgb A1c No results for input(s): HGBA1C in the last 72 hours. Lipid Profile No results  for input(s): CHOL, HDL, LDLCALC, TRIG, CHOLHDL, LDLDIRECT in the last 72 hours. Thyroid function studies No results for input(s): TSH, T4TOTAL, T3FREE, THYROIDAB in the last 72 hours.  Invalid input(s): FREET3 Anemia work up No results for input(s): VITAMINB12, FOLATE, FERRITIN, TIBC, IRON, RETICCTPCT in the last 72 hours. Urinalysis    Component Value Date/Time   COLORURINE YELLOW 10/12/2016 1610   APPEARANCEUR CLEAR 10/12/2016 1610   LABSPEC 1.011 10/12/2016 1610   PHURINE 6.0 10/12/2016 1610   GLUCOSEU NEGATIVE 10/12/2016 1610   HGBUR NEGATIVE 10/12/2016 1610   BILIRUBINUR NEGATIVE 10/12/2016 1610   KETONESUR NEGATIVE 10/12/2016 1610   PROTEINUR NEGATIVE 10/12/2016 1610   NITRITE NEGATIVE 10/12/2016 1610   LEUKOCYTESUR NEGATIVE 10/12/2016 1610   Sepsis Labs Invalid input(s): PROCALCITONIN,  WBC,  LACTICIDVEN Microbiology Recent Results (from the past 240 hour(s))  MRSA PCR Screening     Status: None   Collection Time: 10/12/16  2:29 AM  Result Value Ref Range Status   MRSA by PCR NEGATIVE NEGATIVE Final    Comment:        The GeneXpert MRSA Assay (FDA approved for NASAL specimens only), is one component of a comprehensive MRSA colonization surveillance program. It is not intended to diagnose MRSA infection nor to guide or monitor treatment for MRSA infections.   Culture, blood (routine x 2) Call MD if unable to obtain prior to antibiotics being given     Status: None   Collection Time: 10/12/16  5:57 AM  Result Value Ref Range Status   Specimen Description BLOOD RIGHT ARM  Final   Special Requests BOTTLES DRAWN AEROBIC AND ANAEROBIC 5CC  Final   Culture   Final    NO GROWTH 5 DAYS Performed at [facility]    Report Status 10/17/2016 FINAL  Final  Culture, blood (routine x 2) Call MD if unable to obtain prior to antibiotics being given     Status: None   Collection Time: 10/12/16  5:57 AM  Result Value Ref Range Status   Specimen Description BLOOD  RIGHT HAND  Final   Special Requests IN PEDIATRIC BOTTLE [place]  Final   Culture   Final    NO GROWTH 5 DAYS Performed at [facility]    Report Status 10/17/2016 FINAL  Final  Respiratory Panel by PCR     Status: Abnormal   Collection Time: 10/12/16  1:00 PM  Result Value Ref Range Status   Adenovirus NOT DETECTED NOT DETECTED Final   Coronavirus 229E NOT DETECTED NOT DETECTED Final   Coronavirus HKU1 NOT DETECTED NOT DETECTED Final   Coronavirus NL63 NOT DETECTED NOT DETECTED Final   Coronavirus OC43 NOT DETECTED NOT DETECTED Final   Metapneumovirus NOT DETECTED NOT DETECTED Final   Rhinovirus / Enterovirus NOT DETECTED NOT DETECTED Final   Influenza A NOT DETECTED NOT DETECTED Final   Influenza B NOT DETECTED NOT DETECTED Final   Parainfluenza Virus 1 NOT  DETECTED NOT DETECTED Final   Parainfluenza Virus 2 NOT DETECTED NOT DETECTED Final   Parainfluenza Virus 3 NOT DETECTED NOT DETECTED Final   Parainfluenza Virus 4 NOT DETECTED NOT DETECTED Final   Respiratory Syncytial Virus DETECTED (A) NOT DETECTED Final    Comment: CRITICAL RESULT CALLED TO, READ BACK BY AND VERIFIED WITH: Marrian Salvage RN 15:15 10/13/16 ([email or web address])    Bordetella pertussis NOT DETECTED NOT DETECTED Final   Chlamydophila pneumoniae NOT DETECTED NOT DETECTED Final   Mycoplasma pneumoniae NOT DETECTED NOT DETECTED Final    Comment: Performed at [facility]     Time coordinating discharge: Over 30 minutes  SIGNED:   Loretha Stapler, MD  [facility] 10/17/2016, 1:05 PM Pager [phone] If 7PM-7AM, please contact night-coverage [email or web address] Password [email or web address]

## 2016-10-17 NOTE — Progress Notes (Signed)
Report called to Lauris Chroman, RN at [facility].  All questions answered.  Pt transported my family member.  VSS.  Pt wheeled out by NT.  Packet given to family, with prescriptions and discharge summary.

## 2016-10-17 NOTE — Care Management Important Message (Signed)
Important Message  Patient Details IM Letter given to Cookie/Case Manager to present to Patient Name: Anycia Kaliszewski MRN: [id number] Date of Birth: [birth year]   Medicare Important Message Given:  Yes    Kerin Salen 10/17/2016, 1:18 [place] Message  Patient Details  Name: Wilmer Flannigan MRN: [id number] Date of Birth: August 28, 1923   Medicare Important Message Given:  Yes    Kerin Salen 10/17/2016, 1:18 PM

## 2016-10-17 NOTE — Progress Notes (Signed)
PHARMACY NOTE:  ANTIMICROBIAL RENAL DOSAGE ADJUSTMENT  Current antimicrobial regimen includes a mismatch between antimicrobial dosage and estimated renal function.  As per policy approved by the Pharmacy & Therapeutics and Medical Executive Committees, the antimicrobial dosage will be adjusted accordingly.  Current antimicrobial dosage:  ceftin 500 mg bid  Indication: CAP  Renal Function:  Estimated Creatinine Clearance: 27.8 mL/min (by C-G formula based on SCr of 1.48 mg/dL (H)). []      On intermittent HD, scheduled: []      On CRRT    Antimicrobial dosage has been changed to: cefttin 500 mg q24h   Thank you for allowing pharmacy to be a part of this patient's care.  Lynelle Doctor, [facility] 10/17/2016 7:15 AM

## 2016-10-18 DIAGNOSIS — M170 Bilateral primary osteoarthritis of knee: Secondary | ICD-10-CM | POA: Diagnosis not present

## 2016-10-18 DIAGNOSIS — R600 Localized edema: Secondary | ICD-10-CM | POA: Diagnosis not present

## 2016-10-21 DIAGNOSIS — R600 Localized edema: Secondary | ICD-10-CM | POA: Diagnosis not present

## 2016-10-21 DIAGNOSIS — M170 Bilateral primary osteoarthritis of knee: Secondary | ICD-10-CM | POA: Diagnosis not present

## 2016-10-25 DIAGNOSIS — M170 Bilateral primary osteoarthritis of knee: Secondary | ICD-10-CM | POA: Diagnosis not present

## 2016-10-25 DIAGNOSIS — Z79899 Other long term (current) drug therapy: Secondary | ICD-10-CM | POA: Diagnosis not present

## 2016-10-25 DIAGNOSIS — R600 Localized edema: Secondary | ICD-10-CM | POA: Diagnosis not present

## 2016-10-28 ENCOUNTER — Ambulatory Visit
Admission: RE | Admit: 2016-10-28 | Discharge: 2016-10-28 | Disposition: A | Discharge status: Home / Self Care | Payer: Medicare Other | Source: Ambulatory Visit | Attending: Geriatric Medicine | Admitting: Geriatric Medicine

## 2016-10-28 ENCOUNTER — Other Ambulatory Visit: Payer: Self-pay | Admitting: Geriatric Medicine

## 2016-10-28 DIAGNOSIS — Z79899 Other long term (current) drug therapy: Secondary | ICD-10-CM | POA: Diagnosis not present

## 2016-10-28 DIAGNOSIS — I129 Hypertensive chronic kidney disease with stage 1 through stage 4 chronic kidney disease, or unspecified chronic kidney disease: Secondary | ICD-10-CM | POA: Diagnosis not present

## 2016-10-28 DIAGNOSIS — N183 Chronic kidney disease, stage 3 (moderate): Secondary | ICD-10-CM | POA: Diagnosis not present

## 2016-10-28 DIAGNOSIS — R600 Localized edema: Secondary | ICD-10-CM | POA: Diagnosis not present

## 2016-10-28 DIAGNOSIS — J181 Lobar pneumonia, unspecified organism: Secondary | ICD-10-CM | POA: Diagnosis not present

## 2016-10-28 DIAGNOSIS — R05 Cough: Secondary | ICD-10-CM | POA: Diagnosis not present

## 2016-10-28 DIAGNOSIS — M170 Bilateral primary osteoarthritis of knee: Secondary | ICD-10-CM | POA: Diagnosis not present

## 2016-11-01 DIAGNOSIS — R600 Localized edema: Secondary | ICD-10-CM | POA: Diagnosis not present

## 2016-11-01 DIAGNOSIS — M170 Bilateral primary osteoarthritis of knee: Secondary | ICD-10-CM | POA: Diagnosis not present

## 2016-11-04 DIAGNOSIS — R600 Localized edema: Secondary | ICD-10-CM | POA: Diagnosis not present

## 2016-11-04 DIAGNOSIS — M170 Bilateral primary osteoarthritis of knee: Secondary | ICD-10-CM | POA: Diagnosis not present

## 2016-11-09 DIAGNOSIS — R600 Localized edema: Secondary | ICD-10-CM | POA: Diagnosis not present

## 2016-11-09 DIAGNOSIS — M170 Bilateral primary osteoarthritis of knee: Secondary | ICD-10-CM | POA: Diagnosis not present

## 2016-11-10 DIAGNOSIS — I129 Hypertensive chronic kidney disease with stage 1 through stage 4 chronic kidney disease, or unspecified chronic kidney disease: Secondary | ICD-10-CM | POA: Diagnosis not present

## 2016-11-10 DIAGNOSIS — E039 Hypothyroidism, unspecified: Secondary | ICD-10-CM | POA: Diagnosis not present

## 2016-11-10 DIAGNOSIS — E669 Obesity, unspecified: Secondary | ICD-10-CM | POA: Diagnosis not present

## 2016-11-10 DIAGNOSIS — F0280 Dementia in other diseases classified elsewhere without behavioral disturbance: Secondary | ICD-10-CM | POA: Diagnosis not present

## 2016-11-10 DIAGNOSIS — M8580 Other specified disorders of bone density and structure, unspecified site: Secondary | ICD-10-CM | POA: Diagnosis not present

## 2016-11-10 DIAGNOSIS — M170 Bilateral primary osteoarthritis of knee: Secondary | ICD-10-CM | POA: Diagnosis not present

## 2016-11-10 DIAGNOSIS — R600 Localized edema: Secondary | ICD-10-CM | POA: Diagnosis not present

## 2016-11-10 DIAGNOSIS — Z87891 Personal history of nicotine dependence: Secondary | ICD-10-CM | POA: Diagnosis not present

## 2016-11-10 DIAGNOSIS — Q748 Other specified congenital malformations of limb(s): Secondary | ICD-10-CM | POA: Diagnosis not present

## 2016-11-10 DIAGNOSIS — D649 Anemia, unspecified: Secondary | ICD-10-CM | POA: Diagnosis not present

## 2016-11-10 DIAGNOSIS — N183 Chronic kidney disease, stage 3 (moderate): Secondary | ICD-10-CM | POA: Diagnosis not present

## 2016-11-10 DIAGNOSIS — K9409 Other complications of colostomy: Secondary | ICD-10-CM | POA: Diagnosis not present

## 2016-11-23 DIAGNOSIS — M170 Bilateral primary osteoarthritis of knee: Secondary | ICD-10-CM | POA: Diagnosis not present

## 2016-12-21 DIAGNOSIS — R05 Cough: Secondary | ICD-10-CM | POA: Diagnosis not present

## 2017-02-24 DIAGNOSIS — H1789 Other corneal scars and opacities: Secondary | ICD-10-CM | POA: Diagnosis not present

## 2017-02-24 DIAGNOSIS — H52202 Unspecified astigmatism, left eye: Secondary | ICD-10-CM | POA: Diagnosis not present

## 2017-02-24 DIAGNOSIS — H31001 Unspecified chorioretinal scars, right eye: Secondary | ICD-10-CM | POA: Diagnosis not present

## 2017-02-24 DIAGNOSIS — H2511 Age-related nuclear cataract, right eye: Secondary | ICD-10-CM | POA: Diagnosis not present

## 2017-03-16 ENCOUNTER — Emergency Department (HOSPITAL_COMMUNITY): Payer: Medicare Other

## 2017-03-16 ENCOUNTER — Emergency Department (HOSPITAL_COMMUNITY)
Admission: EM | Admit: 2017-03-16 | Discharge: 2017-03-17 | Disposition: A | Discharge status: Home / Self Care | Payer: Medicare Other | Attending: Emergency Medicine | Admitting: Emergency Medicine

## 2017-03-16 ENCOUNTER — Encounter (HOSPITAL_COMMUNITY): Payer: Self-pay | Admitting: Nurse Practitioner

## 2017-03-16 DIAGNOSIS — I2510 Atherosclerotic heart disease of native coronary artery without angina pectoris: Secondary | ICD-10-CM | POA: Diagnosis not present

## 2017-03-16 DIAGNOSIS — Z87891 Personal history of nicotine dependence: Secondary | ICD-10-CM | POA: Insufficient documentation

## 2017-03-16 DIAGNOSIS — Q749 Unspecified congenital malformation of limb(s): Secondary | ICD-10-CM | POA: Diagnosis not present

## 2017-03-16 DIAGNOSIS — I10 Essential (primary) hypertension: Secondary | ICD-10-CM | POA: Diagnosis not present

## 2017-03-16 DIAGNOSIS — I129 Hypertensive chronic kidney disease with stage 1 through stage 4 chronic kidney disease, or unspecified chronic kidney disease: Secondary | ICD-10-CM | POA: Diagnosis not present

## 2017-03-16 DIAGNOSIS — J441 Chronic obstructive pulmonary disease with (acute) exacerbation: Secondary | ICD-10-CM

## 2017-03-16 DIAGNOSIS — R0602 Shortness of breath: Secondary | ICD-10-CM | POA: Diagnosis present

## 2017-03-16 DIAGNOSIS — Z79899 Other long term (current) drug therapy: Secondary | ICD-10-CM | POA: Diagnosis not present

## 2017-03-16 DIAGNOSIS — D649 Anemia, unspecified: Secondary | ICD-10-CM | POA: Diagnosis not present

## 2017-03-16 DIAGNOSIS — R0902 Hypoxemia: Secondary | ICD-10-CM | POA: Diagnosis not present

## 2017-03-16 DIAGNOSIS — Z8542 Personal history of malignant neoplasm of other parts of uterus: Secondary | ICD-10-CM | POA: Diagnosis not present

## 2017-03-16 DIAGNOSIS — E039 Hypothyroidism, unspecified: Secondary | ICD-10-CM | POA: Diagnosis not present

## 2017-03-16 DIAGNOSIS — N189 Chronic kidney disease, unspecified: Secondary | ICD-10-CM | POA: Diagnosis not present

## 2017-03-16 DIAGNOSIS — R05 Cough: Secondary | ICD-10-CM | POA: Diagnosis not present

## 2017-03-16 LAB — CBC
HEMATOCRIT: 33.4 % — AB (ref 36.0–46.0)
Hemoglobin: 10.9 g/dL — ABNORMAL LOW (ref 12.0–15.0)
MCH: 28.6 pg (ref 26.0–34.0)
MCHC: 32.6 g/dL (ref 30.0–36.0)
MCV: 87.7 fL (ref 78.0–100.0)
Platelets: 226 10*3/uL (ref 150–400)
RBC: 3.81 MIL/uL — ABNORMAL LOW (ref 3.87–5.11)
RDW: 15.1 % (ref 11.5–15.5)
WBC: 9.7 10*3/uL (ref 4.0–10.5)

## 2017-03-16 LAB — BASIC METABOLIC PANEL
Anion gap: 9 (ref 5–15)
BUN: 27 mg/dL — AB (ref 6–20)
CO2: 26 mmol/L (ref 22–32)
CREATININE: 1.5 mg/dL — AB (ref 0.44–1.00)
Calcium: 8.3 mg/dL — ABNORMAL LOW (ref 8.9–10.3)
Chloride: 105 mmol/L (ref 101–111)
GFR calc Af Amer: 34 mL/min — ABNORMAL LOW (ref >60)
GFR, EST NON AFRICAN AMERICAN: 29 mL/min — AB (ref >60)
Glucose, Bld: 173 mg/dL — ABNORMAL HIGH (ref 65–99)
Potassium: 3.7 mmol/L (ref 3.5–5.1)
SODIUM: 140 mmol/L (ref 135–145)

## 2017-03-16 LAB — I-STAT TROPONIN, ED: Troponin i, poc: 0 ng/mL (ref 0.00–0.08)

## 2017-03-16 LAB — BRAIN NATRIURETIC PEPTIDE: B NATRIURETIC PEPTIDE 5: 117.3 pg/mL — AB (ref 0.0–100.0)

## 2017-03-16 MED ORDER — ALBUTEROL SULFATE (2.5 MG/3ML) 0.083% IN NEBU
2.5000 mg | INHALATION_SOLUTION | Freq: Once | RESPIRATORY_TRACT | Status: AC
  Administered 2017-03-16: 2.5 mg via RESPIRATORY_TRACT
  Filled 2017-03-16: qty 3

## 2017-03-16 NOTE — ED Provider Notes (Addendum)
[place] DEPT Provider Note   CSN: [id number] Arrival date & time: 03/16/17  1922     History   Chief Complaint Chief Complaint  Patient presents with  . Shortness of Breath  Level V caveat dementia. History is obtained from patient and from patient's niece who accompanies  HPI [name] [name] is a 83 y.o. female.Plains of nonproductive cough and mild shortness of breath onset 3 weeks ago. She denies fever denies other associated symptoms.. Treated with albuterol with partial relief. She said dyspnea is mild at present. Nothing makes symptoms better or worse. Denies orthopnea. No chest pain.  HPI  Past Medical History:  Diagnosis Date  . Anemia   . Arthritis   . CAD (coronary artery disease)   . Cancer [facility])    Endometrial  . Congenital disease    Left forearm  . Dementia   . Diverticulosis   . History of degenerative disc disease    Both knees  . Hypertension   . Osteoporosis    osteopenia  . Thyroid disease    Hypothyroidism    Patient Active Problem List   Diagnosis Date Noted  . Sepsis ([place]) 10/12/2016  . Sinus tachycardia 10/12/2016  . HTN (hypertension) 10/12/2016  . COPD exacerbation ([place])   . CAP (community acquired pneumonia) 10/11/2016  . Colostomy in place [facility]) 12/10/2013    Past Surgical History:  Procedure Laterality Date  . ABDOMINAL HYSTERECTOMY    . BOWEL RESECTION    . COLOSTOMY  [id number]  . COLOSTOMY    . OTHER SURGICAL HISTORY    . ROTATOR CUFF REPAIR  1990s    OB History    No data available       Home Medications    Prior to Admission medications   Medication Sig Start Date End Date Taking? Authorizing Provider  acetaminophen (TYLENOL) 325 MG tablet Take 650 mg by mouth daily. At 2 PM   Yes [provider]  acetaminophen (TYLENOL) 650 MG CR tablet Take 650 mg by mouth daily as needed for pain.   Yes [provider]  albuterol (PROVENTIL HFA;VENTOLIN HFA) 108 (90 Base) MCG/ACT inhaler Inhale 2 puffs into  the lungs every 6 (six) hours as needed for wheezing or shortness of breath. 10/17/16  Yes Eber Jones, MD  benzonatate (TESSALON) 100 MG capsule Take 100 mg by mouth 3 (three) times daily as needed for cough.   Yes [provider]  bumetanide (BUMEX) 1 MG tablet Take 2 mg by mouth daily.    Yes [provider]  fluticasone (FLONASE) 50 MCG/ACT nasal spray Place 2 sprays into both nostrils daily.   Yes [provider]  ipratropium-albuterol (DUONEB) 0.5-2.5 (3) MG/3ML SOLN Take 3 mLs by nebulization 3 (three) times daily. 10/17/16  Yes Eber Jones, MD  levothyroxine (SYNTHROID, LEVOTHROID) 75 MCG tablet Take 75 mcg by mouth daily before breakfast.   Yes [provider]  loratadine (CLARITIN) 10 MG tablet Take 10 mg by mouth daily.   Yes [provider]  Melatonin 5 MG TABS Take 1 tablet by mouth at bedtime.   Yes [provider]  metoprolol tartrate (LOPRESSOR) 25 MG tablet Take 25 mg by mouth 2 (two) times daily.   Yes [provider]  potassium chloride (KLOR-CON) 20 MEQ packet Take by mouth 2 (two) times daily.   Yes [provider]  traMADol (ULTRAM) 50 MG tablet Take 50 mg by mouth as directed. Up to 3 times daily as needed  and at bedtime   Yes [provider]  triamcinolone (KENALOG) 0.025 % cream Apply 1 application topically 2 (two) times daily as needed (for rash).   Yes [provider]  guaiFENesin-dextromethorphan (ROBITUSSIN DM) 100-10 MG/5ML syrup Take 5 mLs by mouth every 4 (four) hours as needed for cough. 10/17/16   Eber Jones, MD    Family History Family History  Problem Relation Age of Onset  . Breast cancer Sister   . Heart disease Mother   . Heart attack Father     Social History Social History  Substance Use Topics  . Smoking status: Former Research scientist (life sciences)  . Smokeless tobacco: Never Used  . Alcohol use No     Allergies   Sulfa antibiotics and  Penicillins   Review of Systems Review of Systems  Unable to perform ROS: Dementia  Respiratory: Positive for cough and shortness of breath.   Cardiovascular: Positive for leg swelling.       Bilateral leg swelling for 1 year     Physical Exam Updated Vital Signs BP (!) 181/57   Pulse 95   Temp 98.1 F (36.7 C) (Oral)   Resp (!) 29   Ht 5\' 6"  (1.676 m)   Wt 93 kg (205 lb)   SpO2 93%   BMI 33.09 kg/m   Physical Exam  Constitutional: She appears well-developed and well-nourished.  HENT:  Head: Normocephalic and atraumatic.  Eyes: Conjunctivae are normal. Pupils are equal, round, and reactive to light.  Neck: Neck supple. No JVD present. No tracheal deviation present. No thyromegaly present.  Cardiovascular: Normal rate and regular rhythm.   No murmur heard. Pulmonary/Chest: Effort normal and breath sounds normal.  Abdominal: Soft. Bowel sounds are normal. She exhibits no distension. There is no tenderness.  Musculoskeletal: Normal range of motion. She exhibits edema. She exhibits no tenderness.  2+ pretibial pitting edema bilaterally  Neurological: She is alert. Coordination normal.  Skin: Skin is warm and dry. No rash noted.  Psychiatric: She has a normal mood and affect.  Nursing note and vitals reviewed.    ED Treatments / Results  Labs (all labs ordered are listed, but only abnormal results are displayed) Labs Reviewed  BASIC METABOLIC PANEL - Abnormal; Notable for the following:       Result Value   Glucose, Bld 173 (*)    BUN 27 (*)    Creatinine, Ser 1.50 (*)    Calcium 8.3 (*)    GFR calc non Af Amer 29 (*)    GFR calc Af Amer 34 (*)    All other components within normal limits  CBC - Abnormal; Notable for the following:    RBC 3.81 (*)    Hemoglobin 10.9 (*)    HCT 33.4 (*)    All other components within normal limits  BRAIN NATRIURETIC PEPTIDE  I-STAT TROPOININ, ED    EKG  EKG Interpretation  Date/Time:  Thursday March 16 2017 19:46:21  EDT Ventricular Rate:  93 PR Interval:    QRS Duration: 77 QT Interval:  365 QTC Calculation: 454 R Axis:   13 Text Interpretation:  Sinus rhythm Low voltage, precordial leads No significant change since last tracing Confirmed by Orlie Dakin [phone]) on 03/16/2017 10:01:43 PM     Chest x-ray viewed by me Results for orders placed or performed during the hospital encounter of 04/02/15  Basic metabolic panel  Result Value Ref Range   Sodium 140 135 - 145 mmol/L   Potassium 3.7 3.5 -  5.1 mmol/L   Chloride 105 101 - 111 mmol/L   CO2 26 22 - 32 mmol/L   Glucose, Bld 173 (H) 65 - 99 mg/dL   BUN 27 (H) 6 - 20 mg/dL   Creatinine, Ser 1.50 (H) 0.44 - 1.00 mg/dL   Calcium 8.3 (L) 8.9 - 10.3 mg/dL   GFR calc non Af Amer 29 (L) >60 mL/min   GFR calc Af Amer 34 (L) >60 mL/min   Anion gap 9 5 - 15  CBC  Result Value Ref Range   WBC 9.7 4.0 - 10.5 K/uL   RBC 3.81 (L) 3.87 - 5.11 MIL/uL   Hemoglobin 10.9 (L) 12.0 - 15.0 g/dL   HCT 33.4 (L) 36.0 - 46.0 %   MCV 87.7 78.0 - 100.0 fL   MCH 28.6 26.0 - 34.0 pg   MCHC 32.6 30.0 - 36.0 g/dL   RDW 15.1 11.5 - 15.5 %   Platelets 226 150 - 400 K/uL  Brain natriuretic peptide  Result Value Ref Range   B Natriuretic Peptide 117.3 (H) 0.0 - 100.0 pg/mL  I-stat troponin, ED  Result Value Ref Range   Troponin i, poc 0.00 0.00 - 0.08 ng/mL   Comment 3           Dg Chest 2 View  Result Date: 03/16/2017 CLINICAL DATA:  COPD, dyspnea and cough. EXAM: CHEST  2 VIEW COMPARISON:  10/28/2016 FINDINGS: The heart size and mediastinal contours are within normal limits. There is aortic atherosclerosis at the arch. No apparent aortic aneurysm. Chronic interstitial prominence is seen bilaterally without pneumonic consolidation, effusion or pneumothorax. Biapical pleuroparenchymal thickening and scarring right greater than left. High-riding humeral head on the right with narrowing of the impingement space may reflect chronic right-sided rotator cuff tear. No acute  osseous abnormality. IMPRESSION: 1. No active cardiopulmonary disease. 2. Aortic atherosclerosis. 3. Biapical pleuroparenchymal thickening and scarring, right greater than left. Electronically Signed   By: Ashley Royalty M.D.   On: 03/16/2017 21:17    Radiology Dg Chest 2 View  Result Date: 03/16/2017 CLINICAL DATA:  COPD, dyspnea and cough. EXAM: CHEST  2 VIEW COMPARISON:  10/28/2016 FINDINGS: The heart size and mediastinal contours are within normal limits. There is aortic atherosclerosis at the arch. No apparent aortic aneurysm. Chronic interstitial prominence is seen bilaterally without pneumonic consolidation, effusion or pneumothorax. Biapical pleuroparenchymal thickening and scarring right greater than left. High-riding humeral head on the right with narrowing of the impingement space may reflect chronic right-sided rotator cuff tear. No acute osseous abnormality. IMPRESSION: 1. No active cardiopulmonary disease. 2. Aortic atherosclerosis. 3. Biapical pleuroparenchymal thickening and scarring, right greater than left. Electronically Signed   By: Ashley Royalty M.D.   On: 03/16/2017 21:17    Procedures Procedures (including critical care time)  Medications Ordered in ED Medications  albuterol (PROVENTIL) (2.5 MG/3ML) 0.083% nebulizer solution 2.5 mg (not administered)    12:10 AM Patient's breathing normal after albuterol nebulized treatment in the emergency department. Respiratory rate is 12 counted by me She speaks in paragraphs.  lungs clear auscultation. Initial Impression / Assessment and Plan / ED Course  I have reviewed the triage vital signs and the nursing notes.  Pertinent labs & imaging results that were available during my care of the patient were reviewed by me and considered in my medical decision making (see chart for details).     Plan albuterol HFA with spacer to go use 2 puffs every 4 hours as needed for shortness of  breath or cough. Follow-up with Dr. Felipa Eth as  needed Renal insufficiency is chronic. Anemia is chronic. Blood pressure recheck one week Final Clinical Impressions(s) / ED Diagnoses  Diagnosis #1 exacerbation of COPD #2 chronic renal insufficiency #3 hyperglycemia Final diagnoses:  None  #4 anemia #5 elevated blood pressure New Prescriptions New Prescriptions   No medications on file     Orlie Dakin, MD 03/17/17 1278    Orlie Dakin, MD 03/17/17 [id number]

## 2017-03-16 NOTE — ED Notes (Signed)
Bed: WA17 Expected date:  Expected time:  Means of arrival:  Comments: 83 yo f "knot" in chest, cough

## 2017-03-16 NOTE — ED Triage Notes (Signed)
Pt arrived via EMS from [facility] with c/o cough and respiratory complications. Per ems patient was able to speak in full sentences on arrival. O2 sats were 99% on RA, lungs sounds clear bilaterally and patient denies shortness of breath. No wheezing or dyspnea noted. NSR, A&O x4 and ambulatory with assistance. Per staff has a hx of Pna & COPD, breathing tx @1730  per staff, with some bilateral lower edema which is chronic, staff stated they started to cancel the call but since ems was present they decided to send her own. VS: 170/60, 100, 20 resp, 99% 2L for comfort, 97.9

## 2017-03-17 DIAGNOSIS — J441 Chronic obstructive pulmonary disease with (acute) exacerbation: Secondary | ICD-10-CM | POA: Diagnosis not present

## 2017-03-17 MED ORDER — ALBUTEROL SULFATE HFA 108 (90 BASE) MCG/ACT IN AERS
2.0000 | INHALATION_SPRAY | RESPIRATORY_TRACT | Status: DC | PRN
  Administered 2017-03-17: 2 via RESPIRATORY_TRACT
  Filled 2017-03-17: qty 6.7

## 2017-03-17 MED ORDER — AEROCHAMBER PLUS W/MASK MISC
1.0000 | Freq: Once | Status: AC
Start: 2017-03-17 — End: 2017-03-17
  Administered 2017-03-17: 1
  Filled 2017-03-17: qty 1

## 2017-03-17 NOTE — Discharge Instructions (Signed)
Use your inhaler 2 puffs every 4 hours as needed for cough or shortness of breath. Return if needed more than every 4 hours or see Dr. Felipa Eth. Get your blood pressure rechecked within a week. Today's was elevated at 178/93. If blood pressure remains elevated, Dr. Felipa Eth may need to adjust your medications

## 2017-03-28 DIAGNOSIS — M7989 Other specified soft tissue disorders: Secondary | ICD-10-CM | POA: Diagnosis not present

## 2017-03-30 DIAGNOSIS — M25662 Stiffness of left knee, not elsewhere classified: Secondary | ICD-10-CM | POA: Diagnosis not present

## 2017-03-30 DIAGNOSIS — M6281 Muscle weakness (generalized): Secondary | ICD-10-CM | POA: Diagnosis not present

## 2017-03-30 DIAGNOSIS — M25562 Pain in left knee: Secondary | ICD-10-CM | POA: Diagnosis not present

## 2017-03-30 DIAGNOSIS — M25661 Stiffness of right knee, not elsewhere classified: Secondary | ICD-10-CM | POA: Diagnosis not present

## 2017-03-30 DIAGNOSIS — M25561 Pain in right knee: Secondary | ICD-10-CM | POA: Diagnosis not present

## 2017-03-30 DIAGNOSIS — M170 Bilateral primary osteoarthritis of knee: Secondary | ICD-10-CM | POA: Diagnosis not present

## 2017-04-01 DIAGNOSIS — E039 Hypothyroidism, unspecified: Secondary | ICD-10-CM | POA: Diagnosis not present

## 2017-04-01 DIAGNOSIS — I129 Hypertensive chronic kidney disease with stage 1 through stage 4 chronic kidney disease, or unspecified chronic kidney disease: Secondary | ICD-10-CM | POA: Diagnosis not present

## 2017-04-01 DIAGNOSIS — D649 Anemia, unspecified: Secondary | ICD-10-CM | POA: Diagnosis not present

## 2017-04-01 DIAGNOSIS — I2510 Atherosclerotic heart disease of native coronary artery without angina pectoris: Secondary | ICD-10-CM | POA: Diagnosis not present

## 2017-04-01 DIAGNOSIS — N183 Chronic kidney disease, stage 3 (moderate): Secondary | ICD-10-CM | POA: Diagnosis not present

## 2017-04-04 DIAGNOSIS — M25561 Pain in right knee: Secondary | ICD-10-CM | POA: Diagnosis not present

## 2017-04-04 DIAGNOSIS — M25662 Stiffness of left knee, not elsewhere classified: Secondary | ICD-10-CM | POA: Diagnosis not present

## 2017-04-04 DIAGNOSIS — M170 Bilateral primary osteoarthritis of knee: Secondary | ICD-10-CM | POA: Diagnosis not present

## 2017-04-04 DIAGNOSIS — M6281 Muscle weakness (generalized): Secondary | ICD-10-CM | POA: Diagnosis not present

## 2017-04-04 DIAGNOSIS — M25562 Pain in left knee: Secondary | ICD-10-CM | POA: Diagnosis not present

## 2017-04-04 DIAGNOSIS — M25661 Stiffness of right knee, not elsewhere classified: Secondary | ICD-10-CM | POA: Diagnosis not present

## 2017-04-05 DIAGNOSIS — M6281 Muscle weakness (generalized): Secondary | ICD-10-CM | POA: Diagnosis not present

## 2017-04-05 DIAGNOSIS — M25662 Stiffness of left knee, not elsewhere classified: Secondary | ICD-10-CM | POA: Diagnosis not present

## 2017-04-05 DIAGNOSIS — M170 Bilateral primary osteoarthritis of knee: Secondary | ICD-10-CM | POA: Diagnosis not present

## 2017-04-05 DIAGNOSIS — M25561 Pain in right knee: Secondary | ICD-10-CM | POA: Diagnosis not present

## 2017-04-05 DIAGNOSIS — M25661 Stiffness of right knee, not elsewhere classified: Secondary | ICD-10-CM | POA: Diagnosis not present

## 2017-04-05 DIAGNOSIS — M25562 Pain in left knee: Secondary | ICD-10-CM | POA: Diagnosis not present

## 2017-04-07 DIAGNOSIS — M25661 Stiffness of right knee, not elsewhere classified: Secondary | ICD-10-CM | POA: Diagnosis not present

## 2017-04-07 DIAGNOSIS — M25662 Stiffness of left knee, not elsewhere classified: Secondary | ICD-10-CM | POA: Diagnosis not present

## 2017-04-07 DIAGNOSIS — M25562 Pain in left knee: Secondary | ICD-10-CM | POA: Diagnosis not present

## 2017-04-07 DIAGNOSIS — M6281 Muscle weakness (generalized): Secondary | ICD-10-CM | POA: Diagnosis not present

## 2017-04-07 DIAGNOSIS — M25561 Pain in right knee: Secondary | ICD-10-CM | POA: Diagnosis not present

## 2017-04-07 DIAGNOSIS — M170 Bilateral primary osteoarthritis of knee: Secondary | ICD-10-CM | POA: Diagnosis not present

## 2017-04-11 DIAGNOSIS — M25562 Pain in left knee: Secondary | ICD-10-CM | POA: Diagnosis not present

## 2017-04-11 DIAGNOSIS — M6281 Muscle weakness (generalized): Secondary | ICD-10-CM | POA: Diagnosis not present

## 2017-04-11 DIAGNOSIS — M25662 Stiffness of left knee, not elsewhere classified: Secondary | ICD-10-CM | POA: Diagnosis not present

## 2017-04-11 DIAGNOSIS — M25661 Stiffness of right knee, not elsewhere classified: Secondary | ICD-10-CM | POA: Diagnosis not present

## 2017-04-11 DIAGNOSIS — M170 Bilateral primary osteoarthritis of knee: Secondary | ICD-10-CM | POA: Diagnosis not present

## 2017-04-11 DIAGNOSIS — M25561 Pain in right knee: Secondary | ICD-10-CM | POA: Diagnosis not present

## 2017-04-12 DIAGNOSIS — M25561 Pain in right knee: Secondary | ICD-10-CM | POA: Diagnosis not present

## 2017-04-12 DIAGNOSIS — M25661 Stiffness of right knee, not elsewhere classified: Secondary | ICD-10-CM | POA: Diagnosis not present

## 2017-04-12 DIAGNOSIS — M25562 Pain in left knee: Secondary | ICD-10-CM | POA: Diagnosis not present

## 2017-04-12 DIAGNOSIS — M6281 Muscle weakness (generalized): Secondary | ICD-10-CM | POA: Diagnosis not present

## 2017-04-12 DIAGNOSIS — E039 Hypothyroidism, unspecified: Secondary | ICD-10-CM | POA: Diagnosis not present

## 2017-04-12 DIAGNOSIS — I2510 Atherosclerotic heart disease of native coronary artery without angina pectoris: Secondary | ICD-10-CM | POA: Diagnosis not present

## 2017-04-12 DIAGNOSIS — I129 Hypertensive chronic kidney disease with stage 1 through stage 4 chronic kidney disease, or unspecified chronic kidney disease: Secondary | ICD-10-CM | POA: Diagnosis not present

## 2017-04-12 DIAGNOSIS — M170 Bilateral primary osteoarthritis of knee: Secondary | ICD-10-CM | POA: Diagnosis not present

## 2017-04-12 DIAGNOSIS — D649 Anemia, unspecified: Secondary | ICD-10-CM | POA: Diagnosis not present

## 2017-04-12 DIAGNOSIS — N183 Chronic kidney disease, stage 3 (moderate): Secondary | ICD-10-CM | POA: Diagnosis not present

## 2017-04-12 DIAGNOSIS — M25662 Stiffness of left knee, not elsewhere classified: Secondary | ICD-10-CM | POA: Diagnosis not present

## 2017-04-14 DIAGNOSIS — M6281 Muscle weakness (generalized): Secondary | ICD-10-CM | POA: Diagnosis not present

## 2017-04-14 DIAGNOSIS — M25561 Pain in right knee: Secondary | ICD-10-CM | POA: Diagnosis not present

## 2017-04-14 DIAGNOSIS — M25661 Stiffness of right knee, not elsewhere classified: Secondary | ICD-10-CM | POA: Diagnosis not present

## 2017-04-14 DIAGNOSIS — M25562 Pain in left knee: Secondary | ICD-10-CM | POA: Diagnosis not present

## 2017-04-14 DIAGNOSIS — M25662 Stiffness of left knee, not elsewhere classified: Secondary | ICD-10-CM | POA: Diagnosis not present

## 2017-04-14 DIAGNOSIS — M170 Bilateral primary osteoarthritis of knee: Secondary | ICD-10-CM | POA: Diagnosis not present

## 2017-04-17 DIAGNOSIS — M25662 Stiffness of left knee, not elsewhere classified: Secondary | ICD-10-CM | POA: Diagnosis not present

## 2017-04-17 DIAGNOSIS — M25561 Pain in right knee: Secondary | ICD-10-CM | POA: Diagnosis not present

## 2017-04-17 DIAGNOSIS — M170 Bilateral primary osteoarthritis of knee: Secondary | ICD-10-CM | POA: Diagnosis not present

## 2017-04-17 DIAGNOSIS — M6281 Muscle weakness (generalized): Secondary | ICD-10-CM | POA: Diagnosis not present

## 2017-04-17 DIAGNOSIS — M25661 Stiffness of right knee, not elsewhere classified: Secondary | ICD-10-CM | POA: Diagnosis not present

## 2017-04-17 DIAGNOSIS — M25562 Pain in left knee: Secondary | ICD-10-CM | POA: Diagnosis not present

## 2017-04-18 DIAGNOSIS — M25662 Stiffness of left knee, not elsewhere classified: Secondary | ICD-10-CM | POA: Diagnosis not present

## 2017-04-18 DIAGNOSIS — M25562 Pain in left knee: Secondary | ICD-10-CM | POA: Diagnosis not present

## 2017-04-18 DIAGNOSIS — M6281 Muscle weakness (generalized): Secondary | ICD-10-CM | POA: Diagnosis not present

## 2017-04-18 DIAGNOSIS — M170 Bilateral primary osteoarthritis of knee: Secondary | ICD-10-CM | POA: Diagnosis not present

## 2017-04-18 DIAGNOSIS — M25661 Stiffness of right knee, not elsewhere classified: Secondary | ICD-10-CM | POA: Diagnosis not present

## 2017-04-18 DIAGNOSIS — M25561 Pain in right knee: Secondary | ICD-10-CM | POA: Diagnosis not present

## 2017-04-20 DIAGNOSIS — M25662 Stiffness of left knee, not elsewhere classified: Secondary | ICD-10-CM | POA: Diagnosis not present

## 2017-04-20 DIAGNOSIS — M25561 Pain in right knee: Secondary | ICD-10-CM | POA: Diagnosis not present

## 2017-04-20 DIAGNOSIS — M170 Bilateral primary osteoarthritis of knee: Secondary | ICD-10-CM | POA: Diagnosis not present

## 2017-04-20 DIAGNOSIS — M25661 Stiffness of right knee, not elsewhere classified: Secondary | ICD-10-CM | POA: Diagnosis not present

## 2017-04-20 DIAGNOSIS — M6281 Muscle weakness (generalized): Secondary | ICD-10-CM | POA: Diagnosis not present

## 2017-04-20 DIAGNOSIS — M25562 Pain in left knee: Secondary | ICD-10-CM | POA: Diagnosis not present

## 2017-04-24 DIAGNOSIS — M25661 Stiffness of right knee, not elsewhere classified: Secondary | ICD-10-CM | POA: Diagnosis not present

## 2017-04-24 DIAGNOSIS — M25562 Pain in left knee: Secondary | ICD-10-CM | POA: Diagnosis not present

## 2017-04-24 DIAGNOSIS — M170 Bilateral primary osteoarthritis of knee: Secondary | ICD-10-CM | POA: Diagnosis not present

## 2017-04-24 DIAGNOSIS — M25662 Stiffness of left knee, not elsewhere classified: Secondary | ICD-10-CM | POA: Diagnosis not present

## 2017-04-24 DIAGNOSIS — M25561 Pain in right knee: Secondary | ICD-10-CM | POA: Diagnosis not present

## 2017-04-24 DIAGNOSIS — M6281 Muscle weakness (generalized): Secondary | ICD-10-CM | POA: Diagnosis not present

## 2017-04-26 DIAGNOSIS — M170 Bilateral primary osteoarthritis of knee: Secondary | ICD-10-CM | POA: Diagnosis not present

## 2017-04-26 DIAGNOSIS — M6281 Muscle weakness (generalized): Secondary | ICD-10-CM | POA: Diagnosis not present

## 2017-04-26 DIAGNOSIS — M25662 Stiffness of left knee, not elsewhere classified: Secondary | ICD-10-CM | POA: Diagnosis not present

## 2017-04-26 DIAGNOSIS — M25561 Pain in right knee: Secondary | ICD-10-CM | POA: Diagnosis not present

## 2017-04-26 DIAGNOSIS — M25562 Pain in left knee: Secondary | ICD-10-CM | POA: Diagnosis not present

## 2017-04-26 DIAGNOSIS — M25661 Stiffness of right knee, not elsewhere classified: Secondary | ICD-10-CM | POA: Diagnosis not present

## 2017-04-27 DIAGNOSIS — M6281 Muscle weakness (generalized): Secondary | ICD-10-CM | POA: Diagnosis not present

## 2017-04-27 DIAGNOSIS — M25662 Stiffness of left knee, not elsewhere classified: Secondary | ICD-10-CM | POA: Diagnosis not present

## 2017-04-27 DIAGNOSIS — M25561 Pain in right knee: Secondary | ICD-10-CM | POA: Diagnosis not present

## 2017-04-27 DIAGNOSIS — M170 Bilateral primary osteoarthritis of knee: Secondary | ICD-10-CM | POA: Diagnosis not present

## 2017-04-27 DIAGNOSIS — M25562 Pain in left knee: Secondary | ICD-10-CM | POA: Diagnosis not present

## 2017-04-27 DIAGNOSIS — M25661 Stiffness of right knee, not elsewhere classified: Secondary | ICD-10-CM | POA: Diagnosis not present

## 2017-05-01 DIAGNOSIS — M170 Bilateral primary osteoarthritis of knee: Secondary | ICD-10-CM | POA: Diagnosis not present

## 2017-05-01 DIAGNOSIS — M25661 Stiffness of right knee, not elsewhere classified: Secondary | ICD-10-CM | POA: Diagnosis not present

## 2017-05-01 DIAGNOSIS — M6281 Muscle weakness (generalized): Secondary | ICD-10-CM | POA: Diagnosis not present

## 2017-05-01 DIAGNOSIS — M25562 Pain in left knee: Secondary | ICD-10-CM | POA: Diagnosis not present

## 2017-05-01 DIAGNOSIS — M25662 Stiffness of left knee, not elsewhere classified: Secondary | ICD-10-CM | POA: Diagnosis not present

## 2017-05-01 DIAGNOSIS — M25561 Pain in right knee: Secondary | ICD-10-CM | POA: Diagnosis not present

## 2017-05-03 DIAGNOSIS — M25562 Pain in left knee: Secondary | ICD-10-CM | POA: Diagnosis not present

## 2017-05-03 DIAGNOSIS — M25662 Stiffness of left knee, not elsewhere classified: Secondary | ICD-10-CM | POA: Diagnosis not present

## 2017-05-03 DIAGNOSIS — M6281 Muscle weakness (generalized): Secondary | ICD-10-CM | POA: Diagnosis not present

## 2017-05-03 DIAGNOSIS — M170 Bilateral primary osteoarthritis of knee: Secondary | ICD-10-CM | POA: Diagnosis not present

## 2017-05-03 DIAGNOSIS — M25661 Stiffness of right knee, not elsewhere classified: Secondary | ICD-10-CM | POA: Diagnosis not present

## 2017-05-03 DIAGNOSIS — M25561 Pain in right knee: Secondary | ICD-10-CM | POA: Diagnosis not present

## 2017-05-04 DIAGNOSIS — M6281 Muscle weakness (generalized): Secondary | ICD-10-CM | POA: Diagnosis not present

## 2017-05-04 DIAGNOSIS — M25661 Stiffness of right knee, not elsewhere classified: Secondary | ICD-10-CM | POA: Diagnosis not present

## 2017-05-04 DIAGNOSIS — M25662 Stiffness of left knee, not elsewhere classified: Secondary | ICD-10-CM | POA: Diagnosis not present

## 2017-05-04 DIAGNOSIS — M25562 Pain in left knee: Secondary | ICD-10-CM | POA: Diagnosis not present

## 2017-05-04 DIAGNOSIS — M170 Bilateral primary osteoarthritis of knee: Secondary | ICD-10-CM | POA: Diagnosis not present

## 2017-05-04 DIAGNOSIS — M25561 Pain in right knee: Secondary | ICD-10-CM | POA: Diagnosis not present

## 2017-05-08 DIAGNOSIS — M170 Bilateral primary osteoarthritis of knee: Secondary | ICD-10-CM | POA: Diagnosis not present

## 2017-05-08 DIAGNOSIS — M25561 Pain in right knee: Secondary | ICD-10-CM | POA: Diagnosis not present

## 2017-05-08 DIAGNOSIS — M25562 Pain in left knee: Secondary | ICD-10-CM | POA: Diagnosis not present

## 2017-05-08 DIAGNOSIS — M25661 Stiffness of right knee, not elsewhere classified: Secondary | ICD-10-CM | POA: Diagnosis not present

## 2017-05-08 DIAGNOSIS — M25662 Stiffness of left knee, not elsewhere classified: Secondary | ICD-10-CM | POA: Diagnosis not present

## 2017-05-08 DIAGNOSIS — M6281 Muscle weakness (generalized): Secondary | ICD-10-CM | POA: Diagnosis not present

## 2017-05-10 DIAGNOSIS — M25661 Stiffness of right knee, not elsewhere classified: Secondary | ICD-10-CM | POA: Diagnosis not present

## 2017-05-10 DIAGNOSIS — M25561 Pain in right knee: Secondary | ICD-10-CM | POA: Diagnosis not present

## 2017-05-10 DIAGNOSIS — M6281 Muscle weakness (generalized): Secondary | ICD-10-CM | POA: Diagnosis not present

## 2017-05-10 DIAGNOSIS — M25562 Pain in left knee: Secondary | ICD-10-CM | POA: Diagnosis not present

## 2017-05-10 DIAGNOSIS — M25662 Stiffness of left knee, not elsewhere classified: Secondary | ICD-10-CM | POA: Diagnosis not present

## 2017-05-10 DIAGNOSIS — M170 Bilateral primary osteoarthritis of knee: Secondary | ICD-10-CM | POA: Diagnosis not present

## 2017-05-11 DIAGNOSIS — M6281 Muscle weakness (generalized): Secondary | ICD-10-CM | POA: Diagnosis not present

## 2017-05-11 DIAGNOSIS — M25562 Pain in left knee: Secondary | ICD-10-CM | POA: Diagnosis not present

## 2017-05-11 DIAGNOSIS — M25561 Pain in right knee: Secondary | ICD-10-CM | POA: Diagnosis not present

## 2017-05-11 DIAGNOSIS — M25662 Stiffness of left knee, not elsewhere classified: Secondary | ICD-10-CM | POA: Diagnosis not present

## 2017-05-11 DIAGNOSIS — M170 Bilateral primary osteoarthritis of knee: Secondary | ICD-10-CM | POA: Diagnosis not present

## 2017-05-11 DIAGNOSIS — M25661 Stiffness of right knee, not elsewhere classified: Secondary | ICD-10-CM | POA: Diagnosis not present

## 2017-05-12 DIAGNOSIS — I129 Hypertensive chronic kidney disease with stage 1 through stage 4 chronic kidney disease, or unspecified chronic kidney disease: Secondary | ICD-10-CM | POA: Diagnosis not present

## 2017-05-12 DIAGNOSIS — R269 Unspecified abnormalities of gait and mobility: Secondary | ICD-10-CM | POA: Diagnosis not present

## 2017-05-12 DIAGNOSIS — I700 Atherosclerosis of aorta: Secondary | ICD-10-CM | POA: Diagnosis not present

## 2017-05-12 DIAGNOSIS — E039 Hypothyroidism, unspecified: Secondary | ICD-10-CM | POA: Diagnosis not present

## 2017-05-12 DIAGNOSIS — Z79899 Other long term (current) drug therapy: Secondary | ICD-10-CM | POA: Diagnosis not present

## 2017-05-12 DIAGNOSIS — Z933 Colostomy status: Secondary | ICD-10-CM | POA: Diagnosis not present

## 2017-05-12 DIAGNOSIS — Z0000 Encounter for general adult medical examination without abnormal findings: Secondary | ICD-10-CM | POA: Diagnosis not present

## 2017-05-12 DIAGNOSIS — Z23 Encounter for immunization: Secondary | ICD-10-CM | POA: Diagnosis not present

## 2017-05-12 DIAGNOSIS — Z1389 Encounter for screening for other disorder: Secondary | ICD-10-CM | POA: Diagnosis not present

## 2017-05-12 DIAGNOSIS — N183 Chronic kidney disease, stage 3 (moderate): Secondary | ICD-10-CM | POA: Diagnosis not present

## 2017-05-15 DIAGNOSIS — M25661 Stiffness of right knee, not elsewhere classified: Secondary | ICD-10-CM | POA: Diagnosis not present

## 2017-05-15 DIAGNOSIS — M25561 Pain in right knee: Secondary | ICD-10-CM | POA: Diagnosis not present

## 2017-05-15 DIAGNOSIS — M25562 Pain in left knee: Secondary | ICD-10-CM | POA: Diagnosis not present

## 2017-05-15 DIAGNOSIS — M170 Bilateral primary osteoarthritis of knee: Secondary | ICD-10-CM | POA: Diagnosis not present

## 2017-05-15 DIAGNOSIS — M6281 Muscle weakness (generalized): Secondary | ICD-10-CM | POA: Diagnosis not present

## 2017-05-15 DIAGNOSIS — M25662 Stiffness of left knee, not elsewhere classified: Secondary | ICD-10-CM | POA: Diagnosis not present

## 2017-05-17 DIAGNOSIS — M25561 Pain in right knee: Secondary | ICD-10-CM | POA: Diagnosis not present

## 2017-05-17 DIAGNOSIS — M170 Bilateral primary osteoarthritis of knee: Secondary | ICD-10-CM | POA: Diagnosis not present

## 2017-05-17 DIAGNOSIS — M25661 Stiffness of right knee, not elsewhere classified: Secondary | ICD-10-CM | POA: Diagnosis not present

## 2017-05-17 DIAGNOSIS — I2510 Atherosclerotic heart disease of native coronary artery without angina pectoris: Secondary | ICD-10-CM | POA: Diagnosis not present

## 2017-05-17 DIAGNOSIS — N183 Chronic kidney disease, stage 3 (moderate): Secondary | ICD-10-CM | POA: Diagnosis not present

## 2017-05-17 DIAGNOSIS — D649 Anemia, unspecified: Secondary | ICD-10-CM | POA: Diagnosis not present

## 2017-05-17 DIAGNOSIS — I129 Hypertensive chronic kidney disease with stage 1 through stage 4 chronic kidney disease, or unspecified chronic kidney disease: Secondary | ICD-10-CM | POA: Diagnosis not present

## 2017-05-17 DIAGNOSIS — M25662 Stiffness of left knee, not elsewhere classified: Secondary | ICD-10-CM | POA: Diagnosis not present

## 2017-05-17 DIAGNOSIS — M6281 Muscle weakness (generalized): Secondary | ICD-10-CM | POA: Diagnosis not present

## 2017-05-17 DIAGNOSIS — M25562 Pain in left knee: Secondary | ICD-10-CM | POA: Diagnosis not present

## 2017-05-17 DIAGNOSIS — E039 Hypothyroidism, unspecified: Secondary | ICD-10-CM | POA: Diagnosis not present

## 2017-05-19 DIAGNOSIS — M25661 Stiffness of right knee, not elsewhere classified: Secondary | ICD-10-CM | POA: Diagnosis not present

## 2017-05-19 DIAGNOSIS — M170 Bilateral primary osteoarthritis of knee: Secondary | ICD-10-CM | POA: Diagnosis not present

## 2017-05-19 DIAGNOSIS — M25562 Pain in left knee: Secondary | ICD-10-CM | POA: Diagnosis not present

## 2017-05-19 DIAGNOSIS — M6281 Muscle weakness (generalized): Secondary | ICD-10-CM | POA: Diagnosis not present

## 2017-05-19 DIAGNOSIS — M25662 Stiffness of left knee, not elsewhere classified: Secondary | ICD-10-CM | POA: Diagnosis not present

## 2017-05-19 DIAGNOSIS — M25561 Pain in right knee: Secondary | ICD-10-CM | POA: Diagnosis not present

## 2017-05-22 DIAGNOSIS — M25661 Stiffness of right knee, not elsewhere classified: Secondary | ICD-10-CM | POA: Diagnosis not present

## 2017-05-22 DIAGNOSIS — M6281 Muscle weakness (generalized): Secondary | ICD-10-CM | POA: Diagnosis not present

## 2017-05-22 DIAGNOSIS — M25662 Stiffness of left knee, not elsewhere classified: Secondary | ICD-10-CM | POA: Diagnosis not present

## 2017-05-22 DIAGNOSIS — M170 Bilateral primary osteoarthritis of knee: Secondary | ICD-10-CM | POA: Diagnosis not present

## 2017-05-22 DIAGNOSIS — M25562 Pain in left knee: Secondary | ICD-10-CM | POA: Diagnosis not present

## 2017-05-22 DIAGNOSIS — M25561 Pain in right knee: Secondary | ICD-10-CM | POA: Diagnosis not present

## 2017-05-23 DIAGNOSIS — M25562 Pain in left knee: Secondary | ICD-10-CM | POA: Diagnosis not present

## 2017-05-23 DIAGNOSIS — M25661 Stiffness of right knee, not elsewhere classified: Secondary | ICD-10-CM | POA: Diagnosis not present

## 2017-05-23 DIAGNOSIS — M6281 Muscle weakness (generalized): Secondary | ICD-10-CM | POA: Diagnosis not present

## 2017-05-23 DIAGNOSIS — M25662 Stiffness of left knee, not elsewhere classified: Secondary | ICD-10-CM | POA: Diagnosis not present

## 2017-05-23 DIAGNOSIS — M170 Bilateral primary osteoarthritis of knee: Secondary | ICD-10-CM | POA: Diagnosis not present

## 2017-05-23 DIAGNOSIS — M25561 Pain in right knee: Secondary | ICD-10-CM | POA: Diagnosis not present

## 2017-05-25 DIAGNOSIS — M25561 Pain in right knee: Secondary | ICD-10-CM | POA: Diagnosis not present

## 2017-05-25 DIAGNOSIS — M170 Bilateral primary osteoarthritis of knee: Secondary | ICD-10-CM | POA: Diagnosis not present

## 2017-05-25 DIAGNOSIS — M6281 Muscle weakness (generalized): Secondary | ICD-10-CM | POA: Diagnosis not present

## 2017-05-25 DIAGNOSIS — M25662 Stiffness of left knee, not elsewhere classified: Secondary | ICD-10-CM | POA: Diagnosis not present

## 2017-05-25 DIAGNOSIS — M25562 Pain in left knee: Secondary | ICD-10-CM | POA: Diagnosis not present

## 2017-05-25 DIAGNOSIS — M25661 Stiffness of right knee, not elsewhere classified: Secondary | ICD-10-CM | POA: Diagnosis not present

## 2017-05-29 DIAGNOSIS — M6281 Muscle weakness (generalized): Secondary | ICD-10-CM | POA: Diagnosis not present

## 2017-05-29 DIAGNOSIS — M25561 Pain in right knee: Secondary | ICD-10-CM | POA: Diagnosis not present

## 2017-05-29 DIAGNOSIS — M170 Bilateral primary osteoarthritis of knee: Secondary | ICD-10-CM | POA: Diagnosis not present

## 2017-05-29 DIAGNOSIS — M25562 Pain in left knee: Secondary | ICD-10-CM | POA: Diagnosis not present

## 2017-05-29 DIAGNOSIS — M25661 Stiffness of right knee, not elsewhere classified: Secondary | ICD-10-CM | POA: Diagnosis not present

## 2017-05-29 DIAGNOSIS — M25662 Stiffness of left knee, not elsewhere classified: Secondary | ICD-10-CM | POA: Diagnosis not present

## 2017-08-16 DIAGNOSIS — Z23 Encounter for immunization: Secondary | ICD-10-CM | POA: Diagnosis not present

## 2017-09-04 DIAGNOSIS — H0100B Unspecified blepharitis left eye, upper and lower eyelids: Secondary | ICD-10-CM | POA: Diagnosis not present

## 2017-09-04 DIAGNOSIS — H04123 Dry eye syndrome of bilateral lacrimal glands: Secondary | ICD-10-CM | POA: Diagnosis not present

## 2017-11-07 IMAGING — DX DG CHEST 2V
2 series · 2 of 2 positions shown · non-contrast
Comparison: Two-view chest x-ray 10/11/2016

CLINICAL DATA: Cough and shortness breath.

EXAM:
CHEST  2 VIEW

[chest pa]
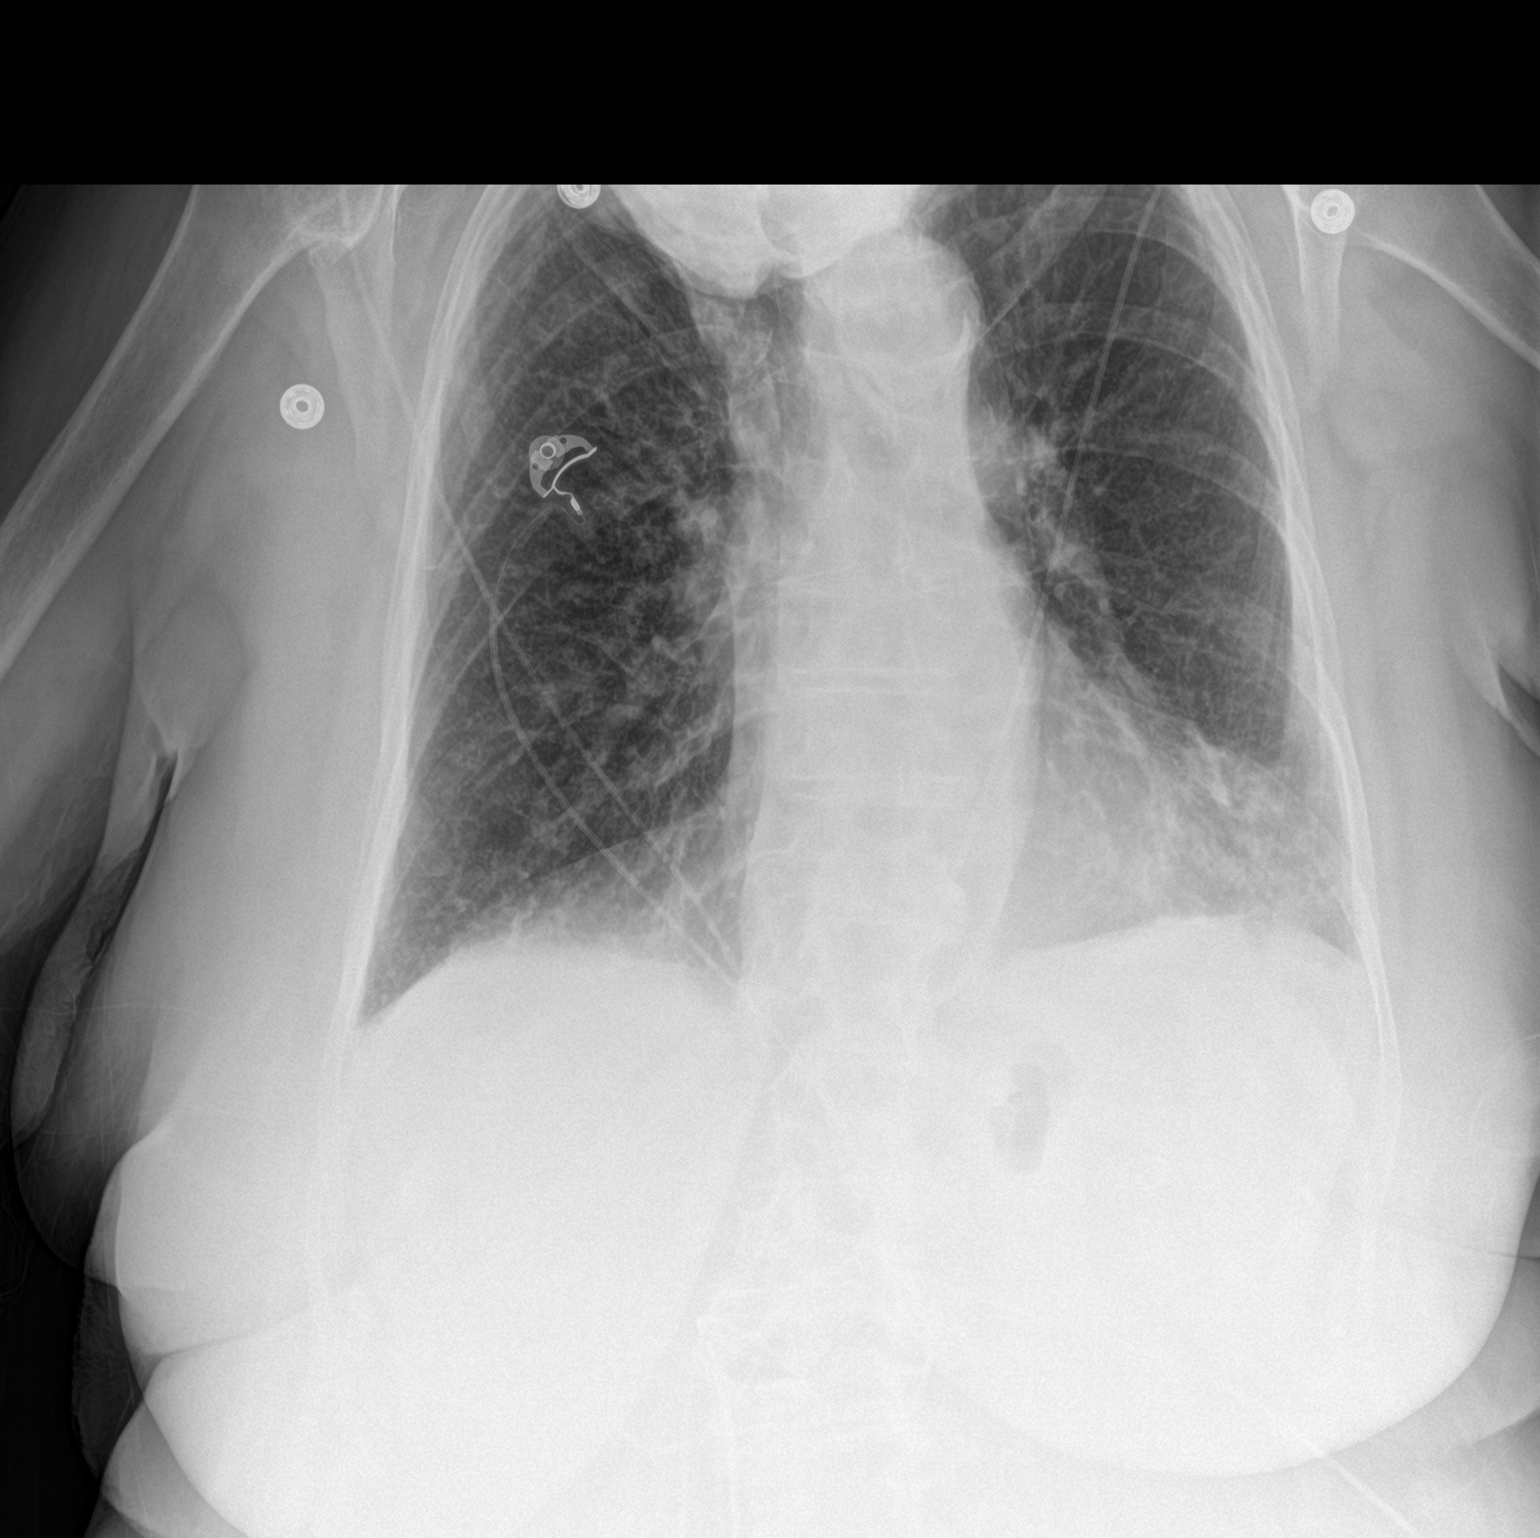

[chest lat]
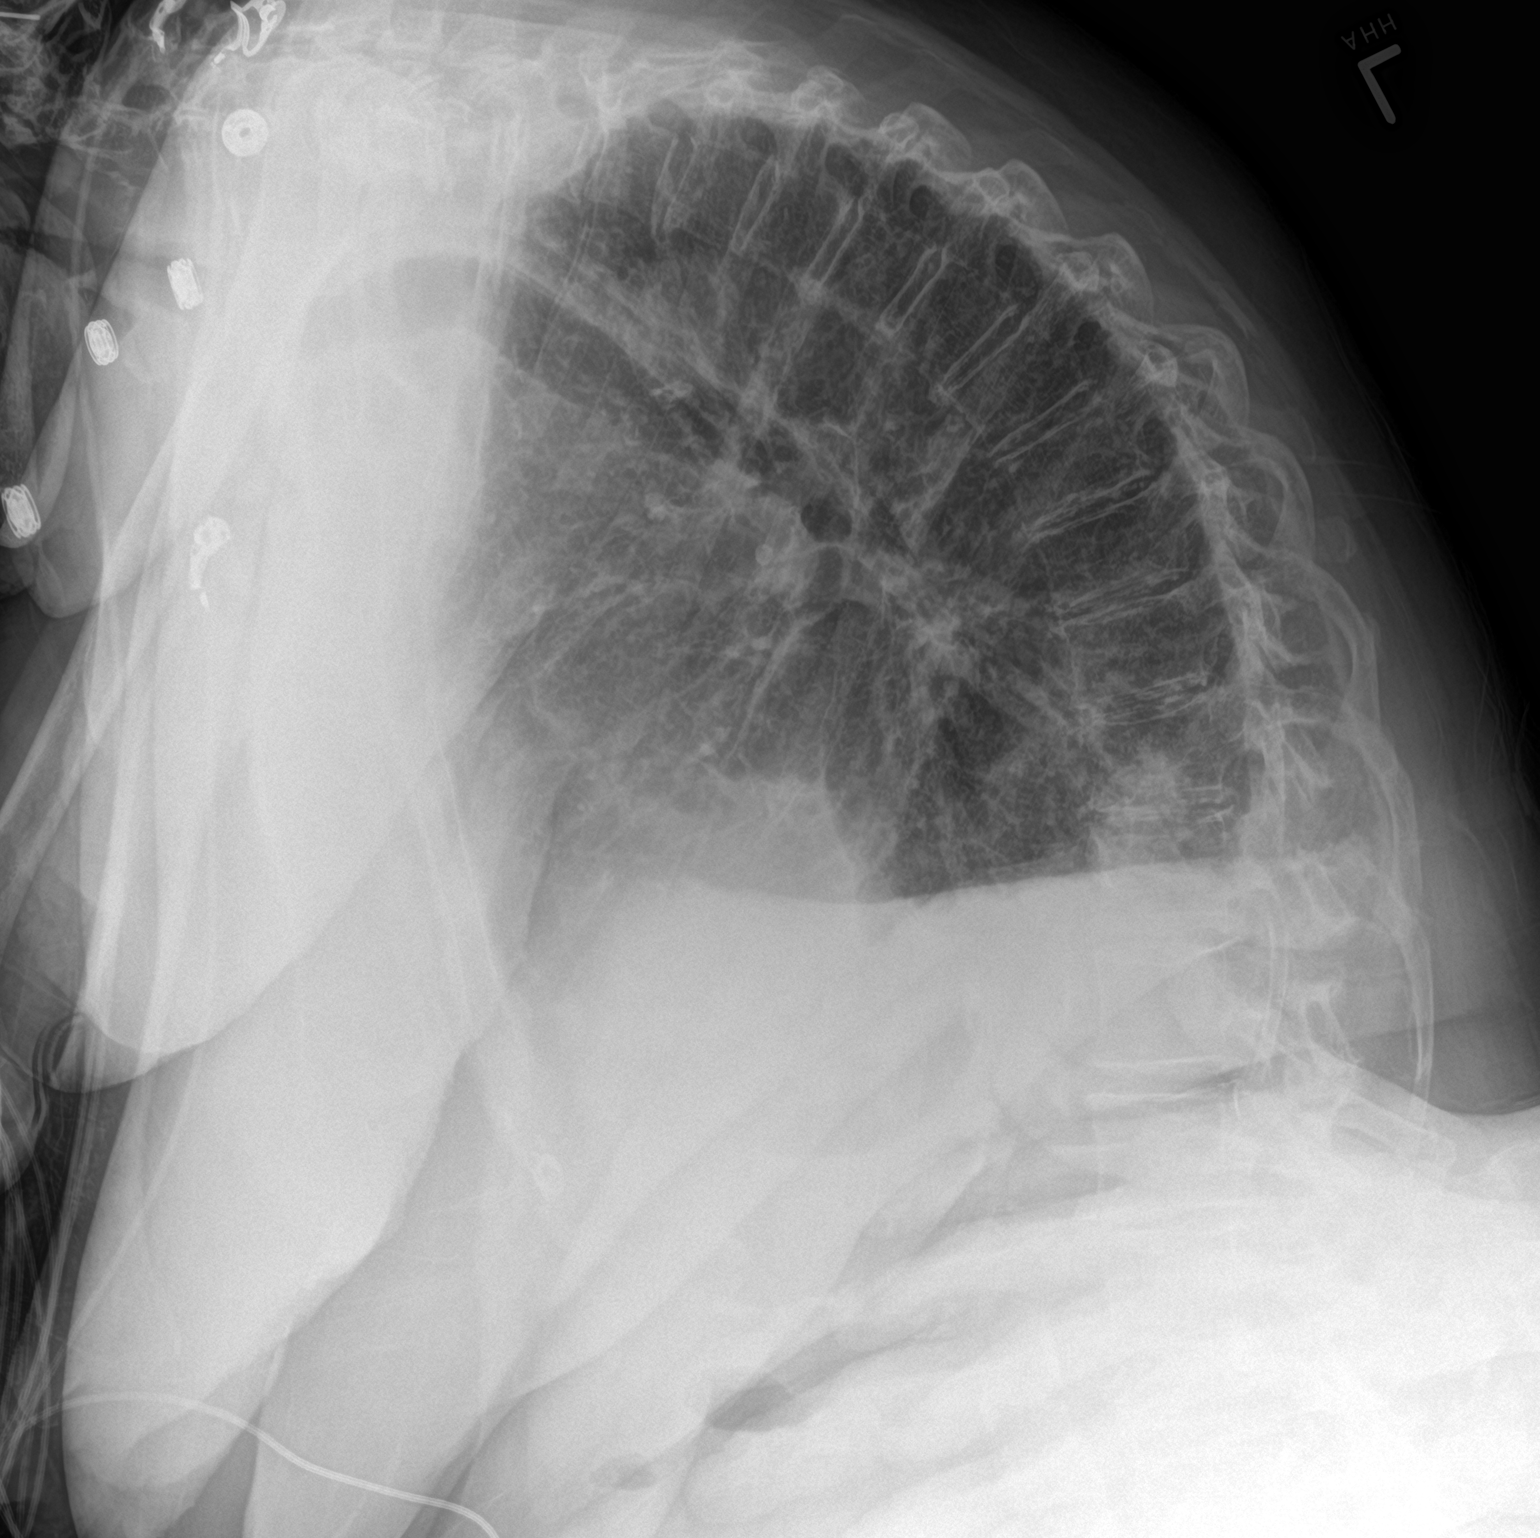

[2 of 2 positions shown; findings below may reference images not displayed]

FINDINGS: Heart size normal. New left lower lobe airspace disease is present.
Emphysematous changes are noted. There slight increase in a diffuse
interstitial pattern. Degenerative changes of the shoulders are
worse on the right.
IMPRESSION: 1. A left lower lobe pneumonia.
2. Emphysema with mild superimposed edema.

## 2017-11-13 DIAGNOSIS — N183 Chronic kidney disease, stage 3 (moderate): Secondary | ICD-10-CM | POA: Diagnosis not present

## 2017-11-13 DIAGNOSIS — M25511 Pain in right shoulder: Secondary | ICD-10-CM | POA: Diagnosis not present

## 2017-11-13 DIAGNOSIS — Z79899 Other long term (current) drug therapy: Secondary | ICD-10-CM | POA: Diagnosis not present

## 2017-11-13 DIAGNOSIS — R21 Rash and other nonspecific skin eruption: Secondary | ICD-10-CM | POA: Diagnosis not present

## 2017-11-13 DIAGNOSIS — I129 Hypertensive chronic kidney disease with stage 1 through stage 4 chronic kidney disease, or unspecified chronic kidney disease: Secondary | ICD-10-CM | POA: Diagnosis not present

## 2017-11-13 DIAGNOSIS — G8929 Other chronic pain: Secondary | ICD-10-CM | POA: Diagnosis not present

## 2017-11-13 DIAGNOSIS — Z933 Colostomy status: Secondary | ICD-10-CM | POA: Diagnosis not present

## 2017-11-19 IMAGING — DX DG CHEST 2V
2 series · 2 of 2 positions shown · non-contrast
Comparison: Chest radiograph dated 10/16/2016

CLINICAL DATA: [AGE] female with cough and recurrent
pneumonia.

EXAM:
CHEST  2 VIEW

[dg chest 2 view (1 of 2)]
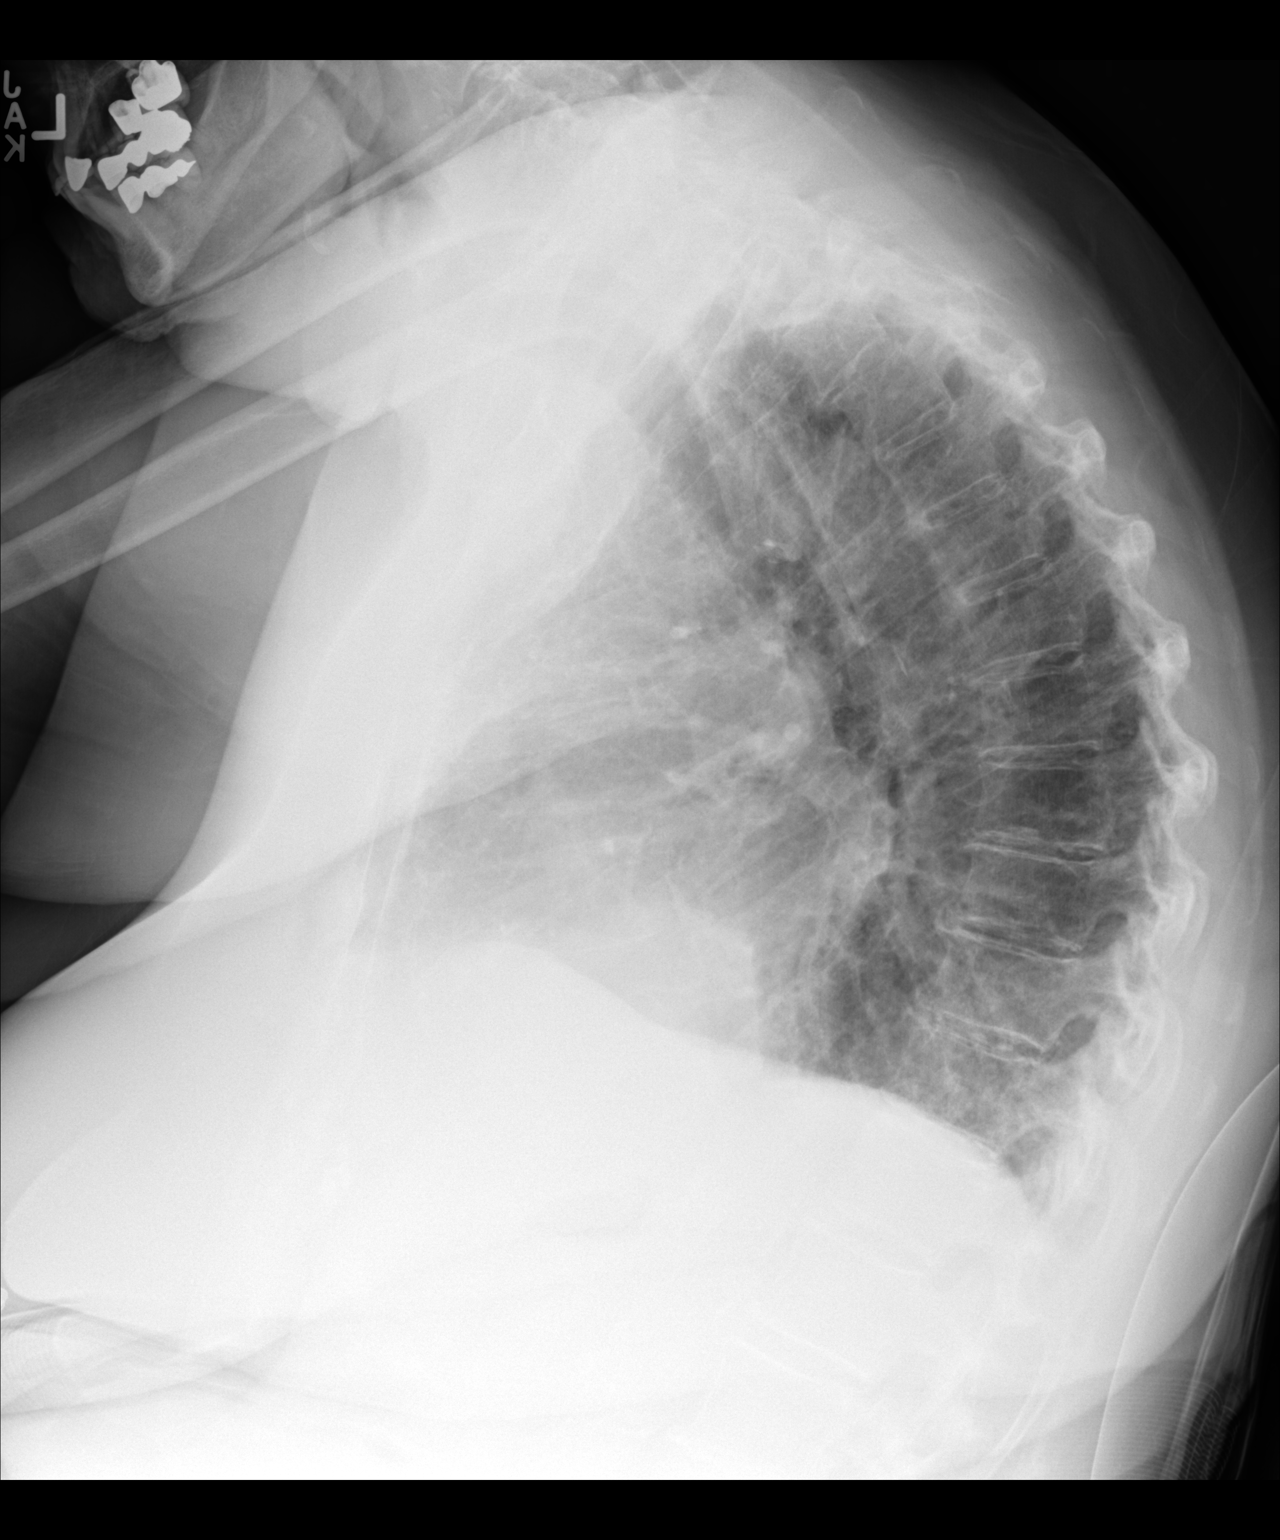

[dg chest 2 view (2 of 2)]
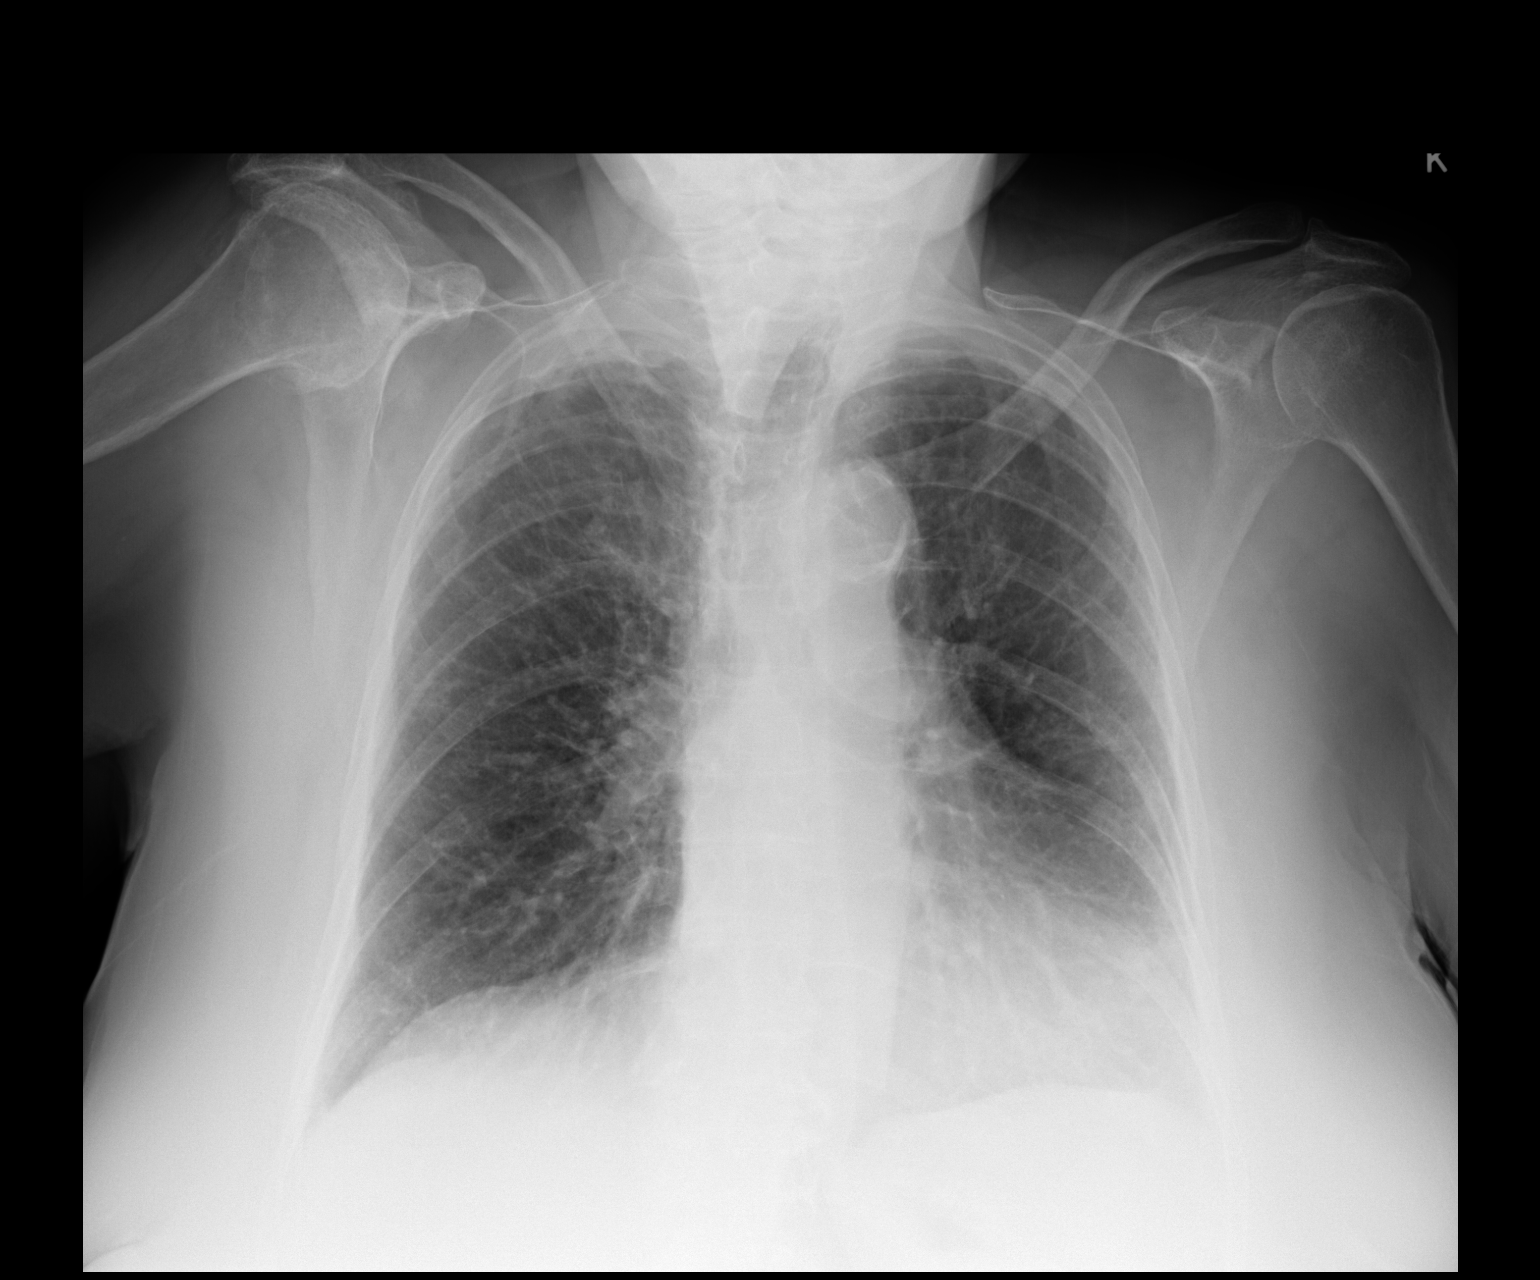

[2 of 2 positions shown; findings below may reference images not displayed]

FINDINGS: Two views of the chest demonstrate emphysematous changes of the
lungs. There has been interval improvement with near complete
clearance of the previously seen left lower lobe airspace disease.
Streaky densities at the left lung base may represent atelectatic
changes/ scarring or minimal residual infiltrate. Clinical
correlation and follow-up recommended. No new consolidative changes.
There is no pleural effusion or pneumothorax. Stable mild
cardiomegaly. There is atherosclerotic calcification of the aortic
arch. Osteopenia with degenerative changes of the spine and right
shoulder. No acute osseous pathology.
IMPRESSION: Near complete clearance of the previously seen left lower lobe
infiltrate.

## 2017-12-28 DIAGNOSIS — M542 Cervicalgia: Secondary | ICD-10-CM | POA: Diagnosis not present

## 2017-12-28 DIAGNOSIS — M19011 Primary osteoarthritis, right shoulder: Secondary | ICD-10-CM | POA: Diagnosis not present

## 2018-03-02 DIAGNOSIS — H52203 Unspecified astigmatism, bilateral: Secondary | ICD-10-CM | POA: Diagnosis not present

## 2018-03-02 DIAGNOSIS — H2511 Age-related nuclear cataract, right eye: Secondary | ICD-10-CM | POA: Diagnosis not present

## 2018-03-02 DIAGNOSIS — H1789 Other corneal scars and opacities: Secondary | ICD-10-CM | POA: Diagnosis not present

## 2018-03-05 ENCOUNTER — Encounter (HOSPITAL_COMMUNITY): Payer: Self-pay

## 2018-03-05 ENCOUNTER — Inpatient Hospital Stay (HOSPITAL_COMMUNITY)
Admission: EM | Admit: 2018-03-05 | Discharge: 2018-03-07 | DRG: 291 | Disposition: A | Discharge status: Nursing Home (Includes ICF) | Payer: Medicare Other | Attending: Family Medicine | Admitting: Family Medicine

## 2018-03-05 ENCOUNTER — Emergency Department (HOSPITAL_COMMUNITY): Payer: Medicare Other

## 2018-03-05 DIAGNOSIS — I959 Hypotension, unspecified: Secondary | ICD-10-CM | POA: Diagnosis not present

## 2018-03-05 DIAGNOSIS — N184 Chronic kidney disease, stage 4 (severe): Secondary | ICD-10-CM | POA: Diagnosis not present

## 2018-03-05 DIAGNOSIS — Z7951 Long term (current) use of inhaled steroids: Secondary | ICD-10-CM

## 2018-03-05 DIAGNOSIS — I493 Ventricular premature depolarization: Secondary | ICD-10-CM | POA: Diagnosis present

## 2018-03-05 DIAGNOSIS — R0602 Shortness of breath: Secondary | ICD-10-CM | POA: Diagnosis not present

## 2018-03-05 DIAGNOSIS — Z933 Colostomy status: Secondary | ICD-10-CM

## 2018-03-05 DIAGNOSIS — D631 Anemia in chronic kidney disease: Secondary | ICD-10-CM | POA: Diagnosis present

## 2018-03-05 DIAGNOSIS — Z87891 Personal history of nicotine dependence: Secondary | ICD-10-CM

## 2018-03-05 DIAGNOSIS — R0689 Other abnormalities of breathing: Secondary | ICD-10-CM | POA: Diagnosis not present

## 2018-03-05 DIAGNOSIS — J449 Chronic obstructive pulmonary disease, unspecified: Secondary | ICD-10-CM | POA: Diagnosis present

## 2018-03-05 DIAGNOSIS — J9621 Acute and chronic respiratory failure with hypoxia: Secondary | ICD-10-CM | POA: Diagnosis present

## 2018-03-05 DIAGNOSIS — Z79899 Other long term (current) drug therapy: Secondary | ICD-10-CM | POA: Diagnosis not present

## 2018-03-05 DIAGNOSIS — R079 Chest pain, unspecified: Secondary | ICD-10-CM | POA: Diagnosis not present

## 2018-03-05 DIAGNOSIS — I130 Hypertensive heart and chronic kidney disease with heart failure and stage 1 through stage 4 chronic kidney disease, or unspecified chronic kidney disease: Principal | ICD-10-CM | POA: Diagnosis present

## 2018-03-05 DIAGNOSIS — I5033 Acute on chronic diastolic (congestive) heart failure: Secondary | ICD-10-CM | POA: Diagnosis not present

## 2018-03-05 DIAGNOSIS — Z66 Do not resuscitate: Secondary | ICD-10-CM | POA: Diagnosis present

## 2018-03-05 DIAGNOSIS — R001 Bradycardia, unspecified: Secondary | ICD-10-CM | POA: Diagnosis present

## 2018-03-05 DIAGNOSIS — I509 Heart failure, unspecified: Secondary | ICD-10-CM | POA: Diagnosis not present

## 2018-03-05 DIAGNOSIS — F0390 Unspecified dementia without behavioral disturbance: Secondary | ICD-10-CM | POA: Diagnosis present

## 2018-03-05 DIAGNOSIS — M810 Age-related osteoporosis without current pathological fracture: Secondary | ICD-10-CM | POA: Diagnosis present

## 2018-03-05 DIAGNOSIS — I110 Hypertensive heart disease with heart failure: Secondary | ICD-10-CM | POA: Diagnosis not present

## 2018-03-05 DIAGNOSIS — I2510 Atherosclerotic heart disease of native coronary artery without angina pectoris: Secondary | ICD-10-CM | POA: Diagnosis present

## 2018-03-05 DIAGNOSIS — Z9981 Dependence on supplemental oxygen: Secondary | ICD-10-CM

## 2018-03-05 DIAGNOSIS — Z8542 Personal history of malignant neoplasm of other parts of uterus: Secondary | ICD-10-CM

## 2018-03-05 DIAGNOSIS — I272 Pulmonary hypertension, unspecified: Secondary | ICD-10-CM | POA: Diagnosis present

## 2018-03-05 DIAGNOSIS — E039 Hypothyroidism, unspecified: Secondary | ICD-10-CM | POA: Diagnosis present

## 2018-03-05 DIAGNOSIS — I10 Essential (primary) hypertension: Secondary | ICD-10-CM | POA: Diagnosis not present

## 2018-03-05 DIAGNOSIS — L309 Dermatitis, unspecified: Secondary | ICD-10-CM | POA: Diagnosis present

## 2018-03-05 DIAGNOSIS — I5031 Acute diastolic (congestive) heart failure: Secondary | ICD-10-CM | POA: Diagnosis not present

## 2018-03-05 DIAGNOSIS — R0902 Hypoxemia: Secondary | ICD-10-CM | POA: Diagnosis not present

## 2018-03-05 LAB — BASIC METABOLIC PANEL
ANION GAP: 9 (ref 5–15)
BUN: 29 mg/dL — ABNORMAL HIGH (ref 6–20)
CALCIUM: 8.5 mg/dL — AB (ref 8.9–10.3)
CHLORIDE: 108 mmol/L (ref 101–111)
CO2: 24 mmol/L (ref 22–32)
Creatinine, Ser: 1.46 mg/dL — ABNORMAL HIGH (ref 0.44–1.00)
GFR calc non Af Amer: 30 mL/min — ABNORMAL LOW (ref >60)
GFR, EST AFRICAN AMERICAN: 35 mL/min — AB (ref >60)
Glucose, Bld: 98 mg/dL (ref 65–99)
Potassium: 4 mmol/L (ref 3.5–5.1)
Sodium: 141 mmol/L (ref 135–145)

## 2018-03-05 LAB — CBC WITH DIFFERENTIAL/PLATELET
BASOS ABS: 0 10*3/uL (ref 0.0–0.1)
BASOS PCT: 0 %
Eosinophils Absolute: 0.2 10*3/uL (ref 0.0–0.7)
Eosinophils Relative: 3 %
HCT: 33.4 % — ABNORMAL LOW (ref 36.0–46.0)
Hemoglobin: 10.6 g/dL — ABNORMAL LOW (ref 12.0–15.0)
LYMPHS PCT: 14 %
Lymphs Abs: 1.3 10*3/uL (ref 0.7–4.0)
MCH: 29.5 pg (ref 26.0–34.0)
MCHC: 31.7 g/dL (ref 30.0–36.0)
MCV: 93 fL (ref 78.0–100.0)
Monocytes Absolute: 0.7 10*3/uL (ref 0.1–1.0)
Monocytes Relative: 8 %
Neutro Abs: 7.1 10*3/uL (ref 1.7–7.7)
Neutrophils Relative %: 75 %
Platelets: 203 10*3/uL (ref 150–400)
RBC: 3.59 MIL/uL — AB (ref 3.87–5.11)
RDW: 14.9 % (ref 11.5–15.5)
WBC: 9.4 10*3/uL (ref 4.0–10.5)

## 2018-03-05 LAB — TROPONIN I
Troponin I: <0.03 ng/mL (ref <0.03)
Troponin I: <0.03 ng/mL (ref <0.03)

## 2018-03-05 LAB — MAGNESIUM: Magnesium: 2.1 mg/dL (ref 1.7–2.4)

## 2018-03-05 LAB — TSH: TSH: 1.288 u[IU]/mL (ref 0.350–4.500)

## 2018-03-05 LAB — BRAIN NATRIURETIC PEPTIDE: B NATRIURETIC PEPTIDE 5: 796.7 pg/mL — AB (ref 0.0–100.0)

## 2018-03-05 LAB — PHOSPHORUS: PHOSPHORUS: 3.4 mg/dL (ref 2.5–4.6)

## 2018-03-05 MED ORDER — SODIUM CHLORIDE 0.9 % IV SOLN: 250.0000 mL | INTRAVENOUS | Status: DC | PRN

## 2018-03-05 MED ORDER — METOPROLOL TARTRATE 25 MG PO TABS: 25.0000 mg | ORAL_TABLET | Freq: Two times a day (BID) | ORAL | Status: DC

## 2018-03-05 MED ORDER — SODIUM CHLORIDE 0.9% FLUSH: 3.0000 mL | INTRAVENOUS | Status: DC | PRN

## 2018-03-05 MED ORDER — IPRATROPIUM-ALBUTEROL 0.5-2.5 (3) MG/3ML IN SOLN
3.0000 mL | Freq: Three times a day (TID) | RESPIRATORY_TRACT | Status: DC
  Administered 2018-03-05 – 2018-03-07 (×5): 3 mL via RESPIRATORY_TRACT
  Filled 2018-03-05 (×4): qty 3

## 2018-03-05 MED ORDER — METOPROLOL TARTRATE 25 MG PO TABS
12.5000 mg | ORAL_TABLET | Freq: Two times a day (BID) | ORAL | Status: DC
Start: 2018-03-06 — End: 2018-03-05

## 2018-03-05 MED ORDER — MELATONIN 5 MG PO TABS
1.0000 | ORAL_TABLET | Freq: Every day | ORAL | Status: DC
  Administered 2018-03-05 – 2018-03-06 (×2): 5 mg via ORAL
  Filled 2018-03-05 (×2): qty 1

## 2018-03-05 MED ORDER — TRAZODONE HCL 50 MG PO TABS
50.0000 mg | ORAL_TABLET | Freq: Every day | ORAL | Status: DC
  Administered 2018-03-05 – 2018-03-06 (×2): 50 mg via ORAL
  Filled 2018-03-05 (×2): qty 1

## 2018-03-05 MED ORDER — TRAMADOL HCL 50 MG PO TABS
25.0000 mg | ORAL_TABLET | Freq: Three times a day (TID) | ORAL | Status: DC | PRN
  Administered 2018-03-05 – 2018-03-06 (×2): 25 mg via ORAL
  Filled 2018-03-05 (×3): qty 1

## 2018-03-05 MED ORDER — HYDRALAZINE HCL 10 MG PO TABS
10.0000 mg | ORAL_TABLET | Freq: Three times a day (TID) | ORAL | Status: DC
  Administered 2018-03-05 – 2018-03-06 (×2): 10 mg via ORAL
  Filled 2018-03-05 (×2): qty 1

## 2018-03-05 MED ORDER — MAGNESIUM SULFATE 2 GM/50ML IV SOLN
2.0000 g | Freq: Once | INTRAVENOUS | Status: AC
  Administered 2018-03-05: 2 g via INTRAVENOUS
  Filled 2018-03-05: qty 50

## 2018-03-05 MED ORDER — FUROSEMIDE 10 MG/ML IJ SOLN
80.0000 mg | Freq: Once | INTRAMUSCULAR | Status: AC
  Administered 2018-03-05: 80 mg via INTRAVENOUS
  Filled 2018-03-05: qty 8

## 2018-03-05 MED ORDER — IPRATROPIUM-ALBUTEROL 0.5-2.5 (3) MG/3ML IN SOLN
3.0000 mL | Freq: Once | RESPIRATORY_TRACT | Status: AC
  Administered 2018-03-05: 3 mL via RESPIRATORY_TRACT
  Filled 2018-03-05: qty 3

## 2018-03-05 MED ORDER — TRAMADOL HCL 50 MG PO TABS
50.0000 mg | ORAL_TABLET | Freq: Once | ORAL | Status: AC
  Administered 2018-03-05: 50 mg via ORAL
  Filled 2018-03-05: qty 1

## 2018-03-05 MED ORDER — SODIUM CHLORIDE 0.9% FLUSH
3.0000 mL | Freq: Two times a day (BID) | INTRAVENOUS | Status: DC
  Administered 2018-03-06 (×2): 3 mL via INTRAVENOUS

## 2018-03-05 MED ORDER — ONDANSETRON HCL 4 MG/2ML IJ SOLN: 4.0000 mg | Freq: Four times a day (QID) | INTRAMUSCULAR | Status: DC | PRN

## 2018-03-05 MED ORDER — FUROSEMIDE 10 MG/ML IJ SOLN: 20.0000 mg | Freq: Once | INTRAMUSCULAR | Status: DC

## 2018-03-05 MED ORDER — LEVOTHYROXINE SODIUM 75 MCG PO TABS
75.0000 ug | ORAL_TABLET | Freq: Every day | ORAL | Status: DC
  Administered 2018-03-06 – 2018-03-07 (×2): 75 ug via ORAL
  Filled 2018-03-05 (×2): qty 1

## 2018-03-05 MED ORDER — ENOXAPARIN SODIUM 30 MG/0.3ML ~~LOC~~ SOLN
30.0000 mg | SUBCUTANEOUS | Status: DC
  Administered 2018-03-05 – 2018-03-06 (×2): 30 mg via SUBCUTANEOUS
  Filled 2018-03-05 (×2): qty 0.3

## 2018-03-05 MED ORDER — ACETAMINOPHEN 325 MG PO TABS: 650.0000 mg | ORAL_TABLET | ORAL | Status: DC | PRN

## 2018-03-05 NOTE — ED Notes (Signed)
Bed: WA03 Expected date:  Expected time:  Means of arrival:  Comments: EMS-SOB 

## 2018-03-05 NOTE — ED Triage Notes (Signed)
Pt arrived via EMS from [facility] [address] [place] Pt is c/o SOB, pt was given Albuterol prior to transport by facility as well as enroute. Pt had no improvement. PT denies chest pain. Pt was placed on 4 lpm via nasal canula. Pt  o2 sat at 95% RA BP 172/57 HR 85 RR 30

## 2018-03-05 NOTE — ED Notes (Signed)
ED TO INPATIENT HANDOFF REPORT  Name/Age/Gender [name] [name] 84 y.o. female  Code Status Code Status History    Date Active Date Inactive Code Status Order ID Comments User Context   10/12/2016 0200 10/17/2016 1835 DNR [id number]  Lily Kocher, MD Inpatient    Questions for Most Recent Historical Code Status (Order [id number])    Question Answer Comment   In the event of cardiac or respiratory ARREST Do not call a "code blue"    In the event of cardiac or respiratory ARREST Do not perform Intubation, CPR, defibrillation or ACLS    In the event of cardiac or respiratory ARREST Use medication by any route, position, wound care, and other measures to relive pain and suffering. May use oxygen, suction and manual treatment of airway obstruction as needed for comfort.         Advance Directive Documentation     Most Recent Value  Type of Advance Directive  Healthcare Power of [place], Out of facility DNR (pink MOST or yellow form)  Pre-existing out of facility DNR order (yellow form or pink MOST form)  -  "MOST" Form in Place?  -      Home/SNF/Other Skilled nursing facility  Chief Complaint Shortness of Breath   Level of Care/Admitting Diagnosis ED Disposition    ED Disposition Condition [place]: [place] [[id number]]  Level of Care: Telemetry [5]  Admit to tele based on following criteria: Acute CHF  Diagnosis: Heart failure, diastolic, acute on chronic [facility]) [[id number]]  Admitting Physician: Bennie Pierini [[id number]]  Attending Physician: Jonnie Finner, [place] [[id number]]  Estimated length of stay: past midnight tomorrow  Certification:: I certify this patient will need inpatient services for at least 2 midnights  PT Class (Do Not Modify): Inpatient [101]  PT Acc Code (Do Not Modify): Private [1]       Medical History Past Medical History:  Diagnosis Date  . Anemia   . Arthritis   . CAD (coronary artery disease)   . Cancer [facility])    Endometrial  . Congenital disease    Left forearm  . Dementia   . Diverticulosis   . History of degenerative disc disease    Both knees  . Hypertension   . Osteoporosis    osteopenia  . Thyroid disease    Hypothyroidism    Allergies Allergies  Allergen Reactions  . Sulfa Antibiotics     Listed on MAR  . Penicillins Itching and Rash    Tolerates ceftriaxone and cefdinir  Has patient had a PCN reaction causing immediate rash, facial/tongue/throat swelling, SOB or lightheadedness with hypotension: Yes Has patient had a PCN reaction causing severe rash involving mucus membranes or skin necrosis: unknown Has patient had a PCN reaction that required hospitalization: Unknown Has patient had a PCN reaction occurring within the last 10 years: Unknown If all of the above answers are "NO", then may proceed with Cephalosporin use.     IV Location/Drains/Wounds Patient Lines/Drains/Airways Status   Active Line/Drains/Airways    Name:   Placement date:   Placement time:   Site:   Days:   Peripheral IV 10/15/16 Right;Posterior Forearm   10/15/16    0552    Forearm   506   Peripheral IV 03/05/18 Right Antecubital   03/05/18    1655    Antecubital   less than 1   Colostomy LUQ   -    -    LUQ  Labs/Imaging Results for orders placed or performed during the hospital encounter of 03/05/18 (from the past 48 hour(s))  Basic metabolic panel     Status: Abnormal   Collection Time: 03/05/18  4:55 PM  Result Value Ref Range   Sodium 141 135 - 145 mmol/L   Potassium 4.0 3.5 - 5.1 mmol/L   Chloride 108 101 - 111 mmol/L   CO2 24 22 - 32 mmol/L   Glucose, Bld 98 65 - 99 mg/dL   BUN 29 (H) 6 - 20 mg/dL   Creatinine, Ser 1.46 (H) 0.44 - 1.00 mg/dL   Calcium 8.5 (L) 8.9 - 10.3 mg/dL   GFR calc non Af Amer 30 (L) >60 mL/min   GFR calc Af Amer 35 (L) >60 mL/min    Comment: (NOTE) The eGFR has been calculated using the CKD EPI equation. This calculation has not been validated in all  clinical situations. eGFR's persistently <60 mL/min signify possible Chronic Kidney Disease.    Anion gap 9 5 - 15    Comment: Performed at [facility], [place] [address] [place], [place] [address]  Troponin I     Status: None   Collection Time: 03/05/18  4:55 PM  Result Value Ref Range   Troponin I <0.03 <0.03 ng/mL    Comment: Performed at [facility], [place] [address] [place], [place] [address]  CBC with Differential     Status: Abnormal   Collection Time: 03/05/18  4:55 PM  Result Value Ref Range   WBC 9.4 4.0 - 10.5 K/uL   RBC 3.59 (L) 3.87 - 5.11 MIL/uL   Hemoglobin 10.6 (L) 12.0 - 15.0 g/dL   HCT 33.4 (L) 36.0 - 46.0 %   MCV 93.0 78.0 - 100.0 fL   MCH 29.5 26.0 - 34.0 pg   MCHC 31.7 30.0 - 36.0 g/dL   RDW 14.9 11.5 - 15.5 %   Platelets 203 150 - 400 K/uL   Neutrophils Relative % 75 %   Neutro Abs 7.1 1.7 - 7.7 K/uL   Lymphocytes Relative 14 %   Lymphs Abs 1.3 0.7 - 4.0 K/uL   Monocytes Relative 8 %   Monocytes Absolute 0.7 0.1 - 1.0 K/uL   Eosinophils Relative 3 %   Eosinophils Absolute 0.2 0.0 - 0.7 K/uL   Basophils Relative 0 %   Basophils Absolute 0.0 0.0 - 0.1 K/uL    Comment: Performed at [facility], [place] [address], [place], [place] [address]  Brain natriuretic peptide     Status: Abnormal   Collection Time: 03/05/18  4:58 PM  Result Value Ref Range   B Natriuretic Peptide 796.7 (H) 0.0 - 100.0 pg/mL    Comment: Performed at [facility], [place] [address] [place], [place] [address]   Dg Chest 2 View  Result Date: 03/05/2018 CLINICAL DATA:  Shortness of breath, history coronary artery disease, dementia, hypertension, former smoker EXAM: CHEST - 2 VIEW COMPARISON:  03/16/2017 FINDINGS: Enlargement of cardiac silhouette with pulmonary vascular congestion. Atherosclerotic calcification aorta. Chronic interstitial changes at the RIGHT upper lobe. Underlying emphysematous changes and chronic  bibasilar opacities. No gross acute infiltrate, pleural effusion or pneumothorax. Advanced RIGHT glenohumeral degenerative changes. Osseous demineralization. IMPRESSION: Enlargement of cardiac silhouette with pulmonary vascular congestion. Emphysematous changes with chronic bibasilar opacities and probable RIGHT upper lobe scarring. No definite acute abnormalities. Electronically Signed   By: Lavonia Dana M.D.   On: 03/05/2018 16:26    Pending Labs Unresulted  Labs (From admission, onward)   Start     Ordered   Signed and Corporate treasurer  Daily,   R     Signed and Held   Signed and Held  Magnesium  Once,   R     Signed and Held   Signed and Held  TSH  Once,   R     Signed and Held   Signed and Held  Troponin I  Now then every 6 hours,   R     Signed and Held   Signed and Held  Magnesium  (Magnesium replacement + AM level)  Once,   R     Signed and Held      Vitals/Pain Today's Vitals   03/05/18 1503 03/05/18 1730  BP: (!) 180/165 (!) 142/61  Pulse: 77 88  Resp: 15 (!) 27  Temp: 97.6 F (36.4 C)   TempSrc: Oral   SpO2: 100% 95%    Isolation Precautions No active isolations  Medications Medications  ipratropium-albuterol (DUONEB) 0.5-2.5 (3) MG/3ML nebulizer solution 3 mL (3 mLs Nebulization Given 03/05/18 1549)  traMADol (ULTRAM) tablet 50 mg (50 mg Oral Given 03/05/18 1732)  furosemide (LASIX) injection 80 mg (80 mg Intravenous Given 03/05/18 1922)    Mobility walks

## 2018-03-05 NOTE — ED Notes (Signed)
Provided patient a warm blanket and a diet coke. Repositioned patient on the stretcher.

## 2018-03-05 NOTE — H&P (Addendum)
History and Physical    [name] [name] ZOX:[id number] DOB: 08-19-1923 DOA: 03/05/2018  PCP: Lajean Manes, MD Patient coming from: ALF, [facility]  I have personally briefly reviewed patient's old medical records in [place]  Chief Complaint: Shortness of breath  HPI: [name] [name] is a 84 y.o. female with medical history significant for unspecified dementia, HFpEF, moderate pulmonary hypertension, undocumented COPD, chronic hypoxic respiratory failure on PRN home O2, diverticulitis s/p colostomy (2014) and endometrial cancer who presented to the ED from her assisted living facility with progressive shortness of breath, worse when lying flat and with minimal exertion, worsening over the last 2 to 3 days.  Patient states that at baseline she is able to lie flat and sleep on one pillow, however she has not been able to do that over the last 2 nights.  She endorses chronic dry cough, unchanged recently.  She denies fever, chest pain, palpitations, abdominal distention, abdominal pain, nausea, vomiting, decreased urinary output, worsening lower extremity edema.  She is uncertain of any sick contacts.  She is not certain of her outpatient medication regimen.  Her niece, [name] [name], is at bedside, but is also unable to give information regarding her outpatient medication regimen.  ED Course: In the ED, the patient is afebrile, hypertensive to systolic blood pressure of 180, saturating in the mid 90s on 4 L supplemental oxygen (up from her baseline requirement of 2 L).  Labs are notable for hemoglobin of 10.60 (baseline appears to be 10.0-11.0), BUN 29, creatinine 1.460 (baseline creatinine appears to be close to 1.50), BNP 797 (up from 117 in June 2018), initial troponin not elevated.  Chest x-ray showed cardiomegaly with pulmonary vascular congestion, emphysematous changes with right upper lobe scarring and chronic bibasilar opacities.  She received a nebulizer treatment and Lasix 80 mg IV while in the  ED.  Review of Systems: As per HPI otherwise 10 point review of systems negative.   Past Medical History:  Diagnosis Date  . Anemia   . Arthritis   . CAD (coronary artery disease)   . Cancer [facility])    Endometrial  . Congenital disease    Left forearm  . Dementia   . Diverticulosis   . History of degenerative disc disease    Both knees  . Hypertension   . Osteoporosis    osteopenia  . Thyroid disease    Hypothyroidism    Past Surgical History:  Procedure Laterality Date  . ABDOMINAL HYSTERECTOMY    . BOWEL RESECTION    . COLOSTOMY  [id number]  . COLOSTOMY    . OTHER SURGICAL HISTORY    . ROTATOR CUFF REPAIR  1990s     reports that she has quit smoking. She has never used smokeless tobacco. She reports that she does not drink alcohol or use drugs.  Allergies  Allergen Reactions  . Sulfa Antibiotics     Listed on MAR  . Penicillins Itching and Rash    Tolerates ceftriaxone and cefdinir  Has patient had a PCN reaction causing immediate rash, facial/tongue/throat swelling, SOB or lightheadedness with hypotension: Yes Has patient had a PCN reaction causing severe rash involving mucus membranes or skin necrosis: unknown Has patient had a PCN reaction that required hospitalization: Unknown Has patient had a PCN reaction occurring within the last 10 years: Unknown If all of the above answers are "NO", then may proceed with Cephalosporin use.     Family History  Problem Relation Age of Onset  . Breast cancer Sister   .  Heart disease Mother   . Heart attack Father     Prior to Admission medications   Medication Sig Start Date End Date Taking? Authorizing Provider  acetaminophen (TYLENOL) 325 MG tablet Take 650 mg by mouth daily. At 2 PM   Yes [provider]  albuterol (PROVENTIL HFA;VENTOLIN HFA) 108 (90 Base) MCG/ACT inhaler Inhale 2 puffs into the lungs every 6 (six) hours as needed for wheezing or shortness of breath. 10/17/16  Yes Eber Jones, MD   bumetanide (BUMEX) 1 MG tablet Take 2 mg by mouth daily.    Yes [provider]  fluticasone (FLONASE) 50 MCG/ACT nasal spray Place 2 sprays into both nostrils daily.   Yes [provider]  ipratropium-albuterol (DUONEB) 0.5-2.5 (3) MG/3ML SOLN Take 3 mLs by nebulization 3 (three) times daily. 10/17/16  Yes Eber Jones, MD  levothyroxine (SYNTHROID, LEVOTHROID) 75 MCG tablet Take 75 mcg by mouth daily before breakfast.   Yes [provider]  metoprolol tartrate (LOPRESSOR) 25 MG tablet Take 25 mg by mouth 2 (two) times daily.   Yes [provider]  potassium chloride (KLOR-CON) 20 MEQ packet Take by mouth 2 (two) times daily.   Yes [provider]  traMADol (ULTRAM) 50 MG tablet Take 50 mg by mouth as directed. Up to 3 times daily as needed  and at bedtime   Yes [provider]  traZODone (DESYREL) 50 MG tablet Take 50 mg by mouth at bedtime. 02/26/18  Yes [provider]  benzonatate (TESSALON) 100 MG capsule Take 100 mg by mouth 3 (three) times daily as needed for cough.    [provider]  guaiFENesin-dextromethorphan (ROBITUSSIN DM) 100-10 MG/5ML syrup Take 5 mLs by mouth every 4 (four) hours as needed for cough. 10/17/16   Eber Jones, MD  loratadine (CLARITIN) 10 MG tablet Take 10 mg by mouth daily.    [provider]  Melatonin 5 MG TABS Take 1 tablet by mouth at bedtime.    [provider]  triamcinolone (KENALOG) 0.025 % cream Apply 1 application topically 2 (two) times daily as needed (for rash).    [provider]    Physical Exam: Vitals:   03/05/18 2020 03/05/18 2031 03/05/18 2037 03/05/18 2043  BP:   (!) 149/47   Pulse:   (!) 45   Resp:   20   Temp:   (!) 97.5 F (36.4 C)   TempSrc:   Oral   SpO2:   95% 95%  Weight: 88.4 kg (194 lb 14.4 oz)     Height:  5\' 6"  (1.676 m)      Constitutional: NAD, calm, comfortable Eyes: PERRL, lids and conjunctivae  normal ENMT: Mucous membranes are moist. Posterior pharynx clear of any exudate or lesions.  Neck: normal, supple, no masses Respiratory: scant bibasilar crackles, no W/R, normal air movement Cardiovascular: Regular rate and rhythm, no murmurs / rubs / gallops. 2+ dependent pitting edema of BLE. 1+ pedal pulses. (+) JVD at 10 cmH2O Abdomen: no tenderness, no masses palpated.Bowel sounds positive.  Musculoskeletal: no clubbing / cyanosis of right arm, s/p amputation of left upper extremity. Arthritic changes of right hand. Skin: scattered ecchymoses, no rash Neurologic: CN 2-12 grossly intact. Sensation intact, DTR normal. Strength 5/5 in all 4.  Psychiatric: Normal judgment and insight. Alert and oriented to self, place. Normal mood.    Labs on Admission: I have personally reviewed following labs and imaging studies  CBC: Recent Labs  Lab 03/05/18 1655  WBC 9.4  NEUTROABS 7.1  HGB 10.6*  HCT 33.4*  MCV 93.0  PLT 782   Basic Metabolic Panel: Recent Labs  Lab 03/05/18 1655  NA 141  K 4.0  CL 108  CO2 24  GLUCOSE 98  BUN 29*  CREATININE 1.46*  CALCIUM 8.5*   GFR: Estimated Creatinine Clearance: 27.5 mL/min (A) (by C-G formula based on SCr of 1.46 mg/dL (H)). Liver Function Tests: No results for input(s): AST, ALT, ALKPHOS, BILITOT, PROT, ALBUMIN in the last 168 hours. No results for input(s): LIPASE, AMYLASE in the last 168 hours. No results for input(s): AMMONIA in the last 168 hours. Coagulation Profile: No results for input(s): INR, PROTIME in the last 168 hours. Cardiac Enzymes: Recent Labs  Lab 03/05/18 1655  TROPONINI <0.03   BNP (last 3 results) No results for input(s): PROBNP in the last 8760 hours. HbA1C: No results for input(s): HGBA1C in the last 72 hours. CBG: No results for input(s): GLUCAP in the last 168 hours. Lipid Profile: No results for input(s): CHOL, HDL, LDLCALC, TRIG, CHOLHDL, LDLDIRECT in the last 72 hours. Thyroid Function Tests: No  results for input(s): TSH, T4TOTAL, FREET4, T3FREE, THYROIDAB in the last 72 hours. Anemia Panel: No results for input(s): VITAMINB12, FOLATE, FERRITIN, TIBC, IRON, RETICCTPCT in the last 72 hours. Urine analysis:    Component Value Date/Time   COLORURINE YELLOW 10/12/2016 1610   APPEARANCEUR CLEAR 10/12/2016 1610   LABSPEC 1.011 10/12/2016 1610   PHURINE 6.0 10/12/2016 1610   GLUCOSEU NEGATIVE 10/12/2016 1610   HGBUR NEGATIVE 10/12/2016 1610   BILIRUBINUR NEGATIVE 10/12/2016 1610   KETONESUR NEGATIVE 10/12/2016 1610   PROTEINUR NEGATIVE 10/12/2016 1610   NITRITE NEGATIVE 10/12/2016 1610   LEUKOCYTESUR NEGATIVE 10/12/2016 1610    Radiological Exams on Admission: Dg Chest 2 View  Result Date: 03/05/2018 CLINICAL DATA:  Shortness of breath, history coronary artery disease, dementia, hypertension, former smoker EXAM: CHEST - 2 VIEW COMPARISON:  03/16/2017 FINDINGS: Enlargement of cardiac silhouette with pulmonary vascular congestion. Atherosclerotic calcification aorta. Chronic interstitial changes at the RIGHT upper lobe. Underlying emphysematous changes and chronic bibasilar opacities. No gross acute infiltrate, pleural effusion or pneumothorax. Advanced RIGHT glenohumeral degenerative changes. Osseous demineralization. IMPRESSION: Enlargement of cardiac silhouette with pulmonary vascular congestion. Emphysematous changes with chronic bibasilar opacities and probable RIGHT upper lobe scarring. No definite acute abnormalities. Electronically Signed   By: Lavonia Dana M.D.   On: 03/05/2018 16:26    EKG: Independently reviewed. Sinus rhythm, frequent PVCs  Assessment/Plan Active Problems:   Heart failure, diastolic, acute on chronic (HCC)  Acute on chronic hypoxic respiratory failure 2/2 acute on chronic diastolic heart failure exacerbation Orthopnea with elevated BNP, lower extremity edema and CXR findings consistent with pulmonary vascular congestion. Unclear inciting factor -- medication  non-adherence? Dietary indiscretion? - Lasix 80 mg IV x1 overnight - Fluid restrict to 1.5L - Goal 1L negative overnight - Hold beta blockade given bradycardia - Start hydralazine 10 mg PO TID - Monitor on telemetry; assess PVC burden - Obtain TTE in AM - Consider Cardiology consult - Continue TID Duo-Nebs - RT eval and treat - Wean O2 for goal SpO2 >88% - Trend electrolytes, renal function; replete lytes PRN - Strict I/O, daily weights - Risk stratification labs - Daily BMP, Mg  Chronic medical conditions - Dementia: general delirium precautions, avoid anticholinergics/sedative-hypnotics - Hypothyroidism: continue home Synthroid  DVT prophylaxis: Lovenox Code Status: DNR/DNI Family Communication: Niece, Beth Corzin Disposition Plan: SNF v ALF, pending PT eval Consults called: None  Admission status: Inpatient, Telemetry   Vint Pola Sharene Butters MD [facility]  If 7PM-7AM, please contact night-coverage [email or web address] Password [facility]  03/05/2018, 8:52 PM

## 2018-03-05 NOTE — ED Provider Notes (Signed)
[facility] DEPT Provider Note   CSN: [id number] Arrival date & time: 03/05/18  1450     History   Chief Complaint Chief Complaint  Patient presents with  . Shortness of Breath    HPI [name] [name] is a 84 y.o. female.  HPI  84 year old female with a history of coronary disease and COPD presents with shortness of breath.  The patient states she has been feeling short of breath for a couple days but it is much worse today.  She states she has some chest pain when coughing but no other chest pain.  She is been having a nonproductive cough but denies fevers.  No leg swelling.  She states a breathing treatment she has been given this morning and then by EMS seem to improve some of her symptoms.  Past Medical History:  Diagnosis Date  . Anemia   . Arthritis   . CAD (coronary artery disease)   . Cancer [facility])    Endometrial  . Congenital disease    Left forearm  . Dementia   . Diverticulosis   . History of degenerative disc disease    Both knees  . Hypertension   . Osteoporosis    osteopenia  . Thyroid disease    Hypothyroidism    Patient Active Problem List   Diagnosis Date Noted  . Heart failure, diastolic, acute on chronic ([place]) 03/05/2018  . Acute congestive heart failure ([place])   . Sepsis ([place]) 10/12/2016  . Sinus tachycardia 10/12/2016  . HTN (hypertension) 10/12/2016  . COPD exacerbation ([place])   . CAP (community acquired pneumonia) 10/11/2016  . Colostomy in place [facility]) 12/10/2013    Past Surgical History:  Procedure Laterality Date  . ABDOMINAL HYSTERECTOMY    . BOWEL RESECTION    . COLOSTOMY  [id number]  . COLOSTOMY    . OTHER SURGICAL HISTORY    . ROTATOR CUFF REPAIR  1990s     OB History   None      Home Medications    Prior to Admission medications   Medication Sig Start Date End Date Taking? Authorizing Provider  acetaminophen (TYLENOL) 325 MG tablet Take 650 mg by mouth daily. At 2 PM   Yes [provider]   albuterol (PROVENTIL HFA;VENTOLIN HFA) 108 (90 Base) MCG/ACT inhaler Inhale 2 puffs into the lungs every 6 (six) hours as needed for wheezing or shortness of breath. 10/17/16  Yes Eber Jones, MD  bumetanide (BUMEX) 1 MG tablet Take 2 mg by mouth daily.    Yes [provider]  fluticasone (FLONASE) 50 MCG/ACT nasal spray Place 2 sprays into both nostrils daily.   Yes [provider]  ipratropium-albuterol (DUONEB) 0.5-2.5 (3) MG/3ML SOLN Take 3 mLs by nebulization 3 (three) times daily. 10/17/16  Yes Eber Jones, MD  levothyroxine (SYNTHROID, LEVOTHROID) 75 MCG tablet Take 75 mcg by mouth daily before breakfast.   Yes [provider]  metoprolol tartrate (LOPRESSOR) 25 MG tablet Take 25 mg by mouth 2 (two) times daily.   Yes [provider]  potassium chloride (KLOR-CON) 20 MEQ packet Take by mouth 2 (two) times daily.   Yes [provider]  traMADol (ULTRAM) 50 MG tablet Take 50 mg by mouth as directed. Up to 3 times daily as needed  and at bedtime   Yes [provider]  traZODone (DESYREL) 50 MG tablet Take 50 mg by mouth at bedtime. 02/26/18  Yes [provider]  benzonatate (TESSALON) 100 MG  capsule Take 100 mg by mouth 3 (three) times daily as needed for cough.    [provider]  guaiFENesin-dextromethorphan (ROBITUSSIN DM) 100-10 MG/5ML syrup Take 5 mLs by mouth every 4 (four) hours as needed for cough. 10/17/16   Eber Jones, MD  loratadine (CLARITIN) 10 MG tablet Take 10 mg by mouth daily.    [provider]  Melatonin 5 MG TABS Take 1 tablet by mouth at bedtime.    [provider]  triamcinolone (KENALOG) 0.025 % cream Apply 1 application topically 2 (two) times daily as needed (for rash).    [provider]    Family History Family History  Problem Relation Age of Onset  . Breast cancer Sister   . Heart disease Mother   . Heart attack Father     Social  History Social History   Tobacco Use  . Smoking status: Former Research scientist (life sciences)  . Smokeless tobacco: Never Used  Substance Use Topics  . Alcohol use: No  . Drug use: No     Allergies   Sulfa antibiotics and Penicillins   Review of Systems Review of Systems  Constitutional: Negative for fever.  Respiratory: Positive for cough and shortness of breath.   Cardiovascular: Positive for chest pain (when coughing). Negative for leg swelling.  Gastrointestinal: Negative for abdominal pain.  All other systems reviewed and are negative.    Physical Exam Updated Vital Signs BP (!) 149/47 (BP Location: Right Arm)   Pulse (!) 45   Temp (!) 97.5 F (36.4 C) (Oral)   Resp 20   Ht 5\' 6"  (1.676 m)   Wt 88.4 kg (194 lb 14.4 oz)   SpO2 95%   BMI 31.46 kg/m   Physical Exam  Constitutional: She is oriented to person, place, and time. She appears well-developed and well-nourished.  Non-toxic appearance. She does not appear ill. No distress.  obese  HENT:  Head: Normocephalic and atraumatic.  Right Ear: External ear normal.  Left Ear: External ear normal.  Nose: Nose normal.  Eyes: Right eye exhibits no discharge. Left eye exhibits no discharge.  Cardiovascular: Normal rate, regular rhythm and normal heart sounds.  Pulmonary/Chest: Breath sounds normal. No accessory muscle usage. Tachypnea noted. She has no wheezes.  Abdominal: Soft. She exhibits no distension. There is no tenderness.  Neurological: She is alert and oriented to person, place, and time.  Skin: Skin is warm and dry.  Nursing note and vitals reviewed.    ED Treatments / Results  Labs (all labs ordered are listed, but only abnormal results are displayed) Labs Reviewed  BASIC METABOLIC PANEL - Abnormal; Notable for the following components:      Result Value   BUN 29 (*)    Creatinine, Ser 1.46 (*)    Calcium 8.5 (*)    GFR calc non Af Amer 30 (*)    GFR calc Af Amer 35 (*)    All other components within normal limits    BRAIN NATRIURETIC PEPTIDE - Abnormal; Notable for the following components:   B Natriuretic Peptide 796.7 (*)    All other components within normal limits  CBC WITH DIFFERENTIAL/PLATELET - Abnormal; Notable for the following components:   RBC 3.59 (*)    Hemoglobin 10.6 (*)    HCT 33.4 (*)    All other components within normal limits  TROPONIN I  TROPONIN I  MAGNESIUM  TSH  PHOSPHORUS  BASIC METABOLIC PANEL  TROPONIN I  MAGNESIUM  CBC    EKG  EKG Interpretation  Date/Time:  Monday March 05 2018 17:32:13 EDT Ventricular Rate:  90 PR Interval:    QRS Duration: 86 QT Interval:  369 QTC Calculation: 452 R Axis:   11 Text Interpretation:  Sinus rhythm Multiple ventricular premature complexes Low voltage, extremity and precordial leads Abnormal R-wave progression, early transition PVCs new since June 2018 Confirmed by Sherwood Gambler [phone]) on 03/05/2018 6:01:33 PM   Radiology Dg Chest 2 View  Result Date: 03/05/2018 CLINICAL DATA:  Shortness of breath, history coronary artery disease, dementia, hypertension, former smoker EXAM: CHEST - 2 VIEW COMPARISON:  03/16/2017 FINDINGS: Enlargement of cardiac silhouette with pulmonary vascular congestion. Atherosclerotic calcification aorta. Chronic interstitial changes at the RIGHT upper lobe. Underlying emphysematous changes and chronic bibasilar opacities. No gross acute infiltrate, pleural effusion or pneumothorax. Advanced RIGHT glenohumeral degenerative changes. Osseous demineralization. IMPRESSION: Enlargement of cardiac silhouette with pulmonary vascular congestion. Emphysematous changes with chronic bibasilar opacities and probable RIGHT upper lobe scarring. No definite acute abnormalities. Electronically Signed   By: Lavonia Dana M.D.   On: 03/05/2018 16:26    Procedures Procedures (including critical care time)  Medications Ordered in ED Medications  ipratropium-albuterol (DUONEB) 0.5-2.5 (3) MG/3ML nebulizer solution 3 mL (3 mLs  Nebulization Given 03/05/18 2042)  levothyroxine (SYNTHROID, LEVOTHROID) tablet 75 mcg (has no administration in time range)  Melatonin TABS 5 mg (5 mg Oral Given 03/05/18 2129)  traMADol (ULTRAM) tablet 25 mg (25 mg Oral Given 03/05/18 2122)  traZODone (DESYREL) tablet 50 mg (50 mg Oral Given 03/05/18 2122)  sodium chloride flush (NS) 0.9 % injection 3 mL (3 mLs Intravenous Not Given 03/05/18 2054)  sodium chloride flush (NS) 0.9 % injection 3 mL (has no administration in time range)  0.9 %  sodium chloride infusion (has no administration in time range)  acetaminophen (TYLENOL) tablet 650 mg (has no administration in time range)  ondansetron (ZOFRAN) injection 4 mg (has no administration in time range)  enoxaparin (LOVENOX) injection 30 mg (30 mg Subcutaneous Given 03/05/18 2123)  hydrALAZINE (APRESOLINE) tablet 10 mg (10 mg Oral Given 03/05/18 2315)  ipratropium-albuterol (DUONEB) 0.5-2.5 (3) MG/3ML nebulizer solution 3 mL (3 mLs Nebulization Given 03/05/18 1549)  traMADol (ULTRAM) tablet 50 mg (50 mg Oral Given 03/05/18 1732)  furosemide (LASIX) injection 80 mg (80 mg Intravenous Given 03/05/18 1922)  magnesium sulfate IVPB 2 g 50 mL (0 g Intravenous Stopped 03/05/18 2219)     Initial Impression / Assessment and Plan / ED Course  I have reviewed the triage vital signs and the nursing notes.  Pertinent labs & imaging results that were available during my care of the patient were reviewed by me and considered in my medical decision making (see chart for details).     The patient's shortness of breath is probably from new onset heart failure.  Her BNP is almost 800 and given some vascular congestion with shortness of breath without significant wheezing this is probably more CHF than COPD.  Patient does not appear critically ill but I think would benefit from Lasix and echocardiogram.  Admit to the hospitalist service.  Final Clinical Impressions(s) / ED Diagnoses   Final diagnoses:  Acute congestive heart  failure, unspecified heart failure type [facility])    ED Discharge Orders    None       Sherwood Gambler, MD 03/06/18 0005

## 2018-03-06 ENCOUNTER — Inpatient Hospital Stay (HOSPITAL_COMMUNITY): Payer: Medicare Other

## 2018-03-06 ENCOUNTER — Other Ambulatory Visit: Payer: Self-pay

## 2018-03-06 DIAGNOSIS — E039 Hypothyroidism, unspecified: Secondary | ICD-10-CM

## 2018-03-06 DIAGNOSIS — I5031 Acute diastolic (congestive) heart failure: Secondary | ICD-10-CM

## 2018-03-06 DIAGNOSIS — I10 Essential (primary) hypertension: Secondary | ICD-10-CM

## 2018-03-06 DIAGNOSIS — N184 Chronic kidney disease, stage 4 (severe): Secondary | ICD-10-CM

## 2018-03-06 LAB — BASIC METABOLIC PANEL
Anion gap: 11 (ref 5–15)
BUN: 27 mg/dL — ABNORMAL HIGH (ref 6–20)
CALCIUM: 8.4 mg/dL — AB (ref 8.9–10.3)
CO2: 23 mmol/L (ref 22–32)
Chloride: 108 mmol/L (ref 101–111)
Creatinine, Ser: 1.54 mg/dL — ABNORMAL HIGH (ref 0.44–1.00)
GFR calc Af Amer: 33 mL/min — ABNORMAL LOW (ref >60)
GFR calc non Af Amer: 28 mL/min — ABNORMAL LOW (ref >60)
GLUCOSE: 109 mg/dL — AB (ref 65–99)
Potassium: 3.6 mmol/L (ref 3.5–5.1)
Sodium: 142 mmol/L (ref 135–145)

## 2018-03-06 LAB — CBC
HEMATOCRIT: 32 % — AB (ref 36.0–46.0)
HEMOGLOBIN: 10.2 g/dL — AB (ref 12.0–15.0)
MCH: 29.7 pg (ref 26.0–34.0)
MCHC: 31.9 g/dL (ref 30.0–36.0)
MCV: 93 fL (ref 78.0–100.0)
Platelets: 189 10*3/uL (ref 150–400)
RBC: 3.44 MIL/uL — ABNORMAL LOW (ref 3.87–5.11)
RDW: 14.9 % (ref 11.5–15.5)
WBC: 9.6 10*3/uL (ref 4.0–10.5)

## 2018-03-06 LAB — MAGNESIUM: Magnesium: 2.5 mg/dL — ABNORMAL HIGH (ref 1.7–2.4)

## 2018-03-06 LAB — TROPONIN I: Troponin I: <0.03 ng/mL (ref <0.03)

## 2018-03-06 LAB — ECHOCARDIOGRAM COMPLETE
HEIGHTINCHES: 66 in
Weight: 3114.66 oz

## 2018-03-06 MED ORDER — FUROSEMIDE 10 MG/ML IJ SOLN
40.0000 mg | Freq: Every day | INTRAMUSCULAR | Status: DC
  Administered 2018-03-06: 40 mg via INTRAVENOUS
  Filled 2018-03-06 (×2): qty 4

## 2018-03-06 MED ORDER — HYDRALAZINE HCL 20 MG/ML IJ SOLN: 10.0000 mg | Freq: Three times a day (TID) | INTRAMUSCULAR | Status: DC | PRN

## 2018-03-06 MED ORDER — METOPROLOL TARTRATE 25 MG PO TABS
12.5000 mg | ORAL_TABLET | Freq: Two times a day (BID) | ORAL | Status: DC
  Administered 2018-03-06 – 2018-03-07 (×2): 12.5 mg via ORAL
  Filled 2018-03-06 (×2): qty 1

## 2018-03-06 NOTE — Consult Note (Signed)
   [facility] CM Inpatient Consult   03/06/2018  Keashia Haskins 03-Jul-1923 [id number]    Patient screened for potential [facility] Care Management program due to CHF diagnosis.  Spoke with patient and nephew at bedside. Ms. Snuffer confirms she is from [facility] and plans to return there. Denies having any [facility] Care Management needs at this time.   Provided [facility] Care Management brochure with contact information.   Appreciative of visit.     Marthenia Rolling, MSN-Ed, RN,BSN [facility] Liaison [phone]

## 2018-03-06 NOTE — Progress Notes (Signed)
  Echocardiogram 2D Echocardiogram has been performed.  [name] [name] 03/06/2018, 2:47 PM

## 2018-03-06 NOTE — Progress Notes (Signed)
PROGRESS NOTE  [name] [name] QMG:[id number] DOB: 10/14/1922 DOA: 03/05/2018 PCP: Lajean Manes, MD  HPI/Recap of past 24 hours: [name] [name] is a 84 year old female with medical history significant for dementia, HFpEF, moderate pulmonary hypertension, chronic hypoxic respiratory failure on as needed home O2 (2L), undocumented COPD, diverticulitis status post colostomy, endometrial cancer, presented to the ED from her ALF with progressive shortness of breath, worse when lying flat and minimal exertion for the past couple of days.  She reports a chronic dry cough which has not changed recently.  Denies any other symptoms.  In the ED, patient was noted to be hypertensive, saturating in the mid 90s on 4 L of oxygen, BNP 797 up from baseline, chest x-ray showed cardiomegaly with pulmonary vascular congestion, emphysematous changes.  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 Patient admitted for further management  Today, met patient sitting up in chair, reported feeling much better denied any worsening shortness of breath, denies any chest pain, abdominal pain, nausea/vomiting, fever/chills, worsening cough.   Assessment/Plan: Active Problems:   Heart failure, diastolic, acute on chronic (HCC)   Acute congestive heart failure (HCC)  Acute on chronic diastolic heart failure Improving BNP 797, up from 117 in 6/18 Troponin negative, EKG with normal sinus rhythm, with multiple PVCs Chest x-ray showed cardiomegaly with pulmonary vascular congestion, emphysematous changes Echo at an outside facility in 2014 showed preserved EF, severe pulmonary hypertension, grade 1 diastolic dysfunction Repeat echo pending Continue IV Lasix Hold home Bumex Strict I's and O's, daily weights  Acute on chronic hypoxic respiratory failure Likely due to above Management as above, Lasix, supplemental oxygen, duonebs  Hypertension Uncontrolled IV hydralazine PRN Reduce home  metoprolol dose with parameters as patient noted to have episodes of bradycardia (if persistent, may consider DC upon discharge, although patient has multiple PVCs) Not on any BP home medication  COPD Stable Chest x-ray with emphysematous changes Continue duo nebs, supplemental oxygen  CKD stage IV Baseline creatinine 1.4-1.5 Daily BMP as patient is on Lasix  Anemia of CKD Currently at baseline Daily CBC  Hypothyroidism TSH within normal limits Continue Synthroid    Code Status: DNR  Family Communication: None at bedside  Disposition Plan: Back to SNF on 03/07/2018   Consultants:  None  Procedures:  None  Antimicrobials:  None  DVT prophylaxis: Lovenox   Objective: Vitals:   03/06/18 0500 03/06/18 0600 03/06/18 0805 03/06/18 1314  BP:    (!) 153/51  Pulse:  89  (!) 44  Resp:    18  Temp:    97.6 F (36.4 C)  TempSrc:    Oral  SpO2:   95% 97%  Weight: 88.3 kg (194 lb 10.7 oz)     Height:        Intake/Output Summary (Last 24 hours) at 03/06/2018 1435 Last data filed at 03/06/2018 1239 Gross per 24 hour  Intake 960 ml  Output 1800 ml  Net -840 ml   Filed Weights   03/05/18 2020 03/06/18 0500  Weight: 88.4 kg (194 lb 14.4 oz) 88.3 kg (194 lb 10.7 oz)    Exam:   General: NAD  Cardiovascular: S1, S2 present  Respiratory: Bibasilar crackles  Abdomen: Soft, nontender, nondistended, bowel sounds present  Musculoskeletal: Prosthetic left upper extremity, 1+ pitting edema BLE  Skin: Normal  Psychiatry: Normal mood   Data Reviewed: CBC: Recent Labs  Lab 03/05/18 1655 03/06/18 0234  WBC 9.4 9.6  NEUTROABS 7.1  --  HGB 10.6* 10.2*  HCT 33.4* 32.0*  MCV 93.0 93.0  PLT 203 157   Basic Metabolic Panel: Recent Labs  Lab 03/05/18 1655 03/05/18 2050 03/06/18 0234  NA 141  --  142  K 4.0  --  3.6  CL 108  --  108  CO2 24  --  23  GLUCOSE 98  --  109*  BUN 29*  --  27*  CREATININE 1.46*  --  1.54*  CALCIUM 8.5*  --  8.4*  MG   --  2.1 2.5*  PHOS  --  3.4  --    GFR: Estimated Creatinine Clearance: 26.1 mL/min (A) (by C-G formula based on SCr of 1.54 mg/dL (H)). Liver Function Tests: No results for input(s): AST, ALT, ALKPHOS, BILITOT, PROT, ALBUMIN in the last 168 hours. No results for input(s): LIPASE, AMYLASE in the last 168 hours. No results for input(s): AMMONIA in the last 168 hours. Coagulation Profile: No results for input(s): INR, PROTIME in the last 168 hours. Cardiac Enzymes: Recent Labs  Lab 03/05/18 1655 03/05/18 2050 03/06/18 0234  TROPONINI <0.03 <0.03 <0.03   BNP (last 3 results) No results for input(s): PROBNP in the last 8760 hours. HbA1C: No results for input(s): HGBA1C in the last 72 hours. CBG: No results for input(s): GLUCAP in the last 168 hours. Lipid Profile: No results for input(s): CHOL, HDL, LDLCALC, TRIG, CHOLHDL, LDLDIRECT in the last 72 hours. Thyroid Function Tests: Recent Labs    03/05/18 2050  TSH 1.288   Anemia Panel: No results for input(s): VITAMINB12, FOLATE, FERRITIN, TIBC, IRON, RETICCTPCT in the last 72 hours. Urine analysis:    Component Value Date/Time   COLORURINE YELLOW 10/12/2016 [place] 10/12/2016 1610   LABSPEC 1.011 10/12/2016 1610   PHURINE 6.0 10/12/2016 1610   GLUCOSEU NEGATIVE 10/12/2016 1610   HGBUR NEGATIVE 10/12/2016 1610   BILIRUBINUR NEGATIVE 10/12/2016 1610   KETONESUR NEGATIVE 10/12/2016 1610   PROTEINUR NEGATIVE 10/12/2016 1610   NITRITE NEGATIVE 10/12/2016 1610   LEUKOCYTESUR NEGATIVE 10/12/2016 1610   Sepsis Labs: @LABRCNTIP (procalcitonin:4,lacticidven:4)  )No results found for this or any previous visit (from the past 240 hour(s)).    Studies: Dg Chest 2 View  Result Date: 03/05/2018 CLINICAL DATA:  Shortness of breath, history coronary artery disease, dementia, hypertension, former smoker EXAM: CHEST - 2 VIEW COMPARISON:  03/16/2017 FINDINGS: Enlargement of cardiac silhouette with pulmonary vascular  congestion. Atherosclerotic calcification aorta. Chronic interstitial changes at the RIGHT upper lobe. Underlying emphysematous changes and chronic bibasilar opacities. No gross acute infiltrate, pleural effusion or pneumothorax. Advanced RIGHT glenohumeral degenerative changes. Osseous demineralization. IMPRESSION: Enlargement of cardiac silhouette with pulmonary vascular congestion. Emphysematous changes with chronic bibasilar opacities and probable RIGHT upper lobe scarring. No definite acute abnormalities. Electronically Signed   By: Lavonia Dana M.D.   On: 03/05/2018 16:26    Scheduled Meds: . enoxaparin (LOVENOX) injection  30 mg Subcutaneous Q24H  . furosemide  40 mg Intravenous Daily  . ipratropium-albuterol  3 mL Nebulization TID  . levothyroxine  75 mcg Oral QAC breakfast  . Melatonin  1 tablet Oral QHS  . metoprolol tartrate  12.5 mg Oral BID  . sodium chloride flush  3 mL Intravenous Q12H  . traZODone  50 mg Oral QHS    Continuous Infusions: . sodium chloride       LOS: 1 day     Alma Friendly, MD [facility]   If 7PM-7AM, please contact night-coverage [email or web address] Password [facility] 03/06/2018,  2:35 PM

## 2018-03-06 NOTE — Evaluation (Signed)
Physical Therapy Evaluation Patient Details Name: [name] [name] MRN: [id number] DOB: 09-Jun-1923 Today's Date: 03/06/2018   History of Present Illness  [name] [name] is a 84 y.o. female with medical history significant for unspecified dementia, HFpEF, moderate pulmonary hypertension, undocumented COPD, chronic hypoxic respiratory failure on PRN home O2, diverticulitis s/p colostomy (2014) and endometrial cancer who presented to the ED from her assisted living facility with progressive shortness of breath  Clinical Impression  Patient presents with mobility close to her baseline.  Reports she transfers on her own to w/c and is able to foot propel her w/c to dining facility in her ALF.  She gets help to shower and dresses herself.  Feel she does not need further skilled PT at this time.  Will sign off.    Follow Up Recommendations No PT follow up    Equipment Recommendations  None recommended by PT    Recommendations for Other Services       Precautions / Restrictions Precautions Precautions: Fall Precaution Comments: L UE prosthetic      Mobility  Bed Mobility               General bed mobility comments: up in chair  Transfers Overall transfer level: Needs assistance Equipment used: 1 person hand held assist Transfers: Sit to/from Stand Sit to Stand: Supervision         General transfer comment: increased time/effort, once standing assist with HHA on R  Ambulation/Gait Ambulation/Gait assistance: Mod assist Ambulation Distance (Feet): 8 Feet Assistive device: 1 person hand held assist Gait Pattern/deviations: Decreased stride length;Trunk flexed;Antalgic;Step-to pattern;Shuffle     General Gait Details: assist to walk to door and back to chair, limited by pain in knees due to arthritis and decreased balance  Stairs            Wheelchair Mobility    Modified Rankin (Stroke Patients Only)       Balance Overall balance assessment: Needs assistance   Sitting  balance-Leahy Scale: Good     Standing balance support: Single extremity supported Standing balance-Leahy Scale: Poor Standing balance comment: UE support needed in standing                             Pertinent Vitals/Pain Pain Assessment: Faces Faces Pain Scale: Hurts whole lot Pain Location: bilateral knees with short distance ambulation Pain Descriptors / Indicators: Grimacing;Moaning Pain Intervention(s): Monitored during session;Limited activity within patient's tolerance;Repositioned    Home Living Family/patient expects to be discharged to:: Assisted living               Home Equipment: Wheelchair - manual      Prior Function Level of Independence: Needs assistance   Gait / Transfers Assistance Needed: uses w/c to foot propel to dining facility and bathroom, reports can transfer & manage dressing, but gets help for showering           Hand Dominance   Dominant Hand: Right    Extremity/Trunk Assessment   Upper Extremity Assessment Upper Extremity Assessment: RUE deficits/detail;LUE deficits/detail RUE Deficits / Details: limited shoulder AROM about 25 to 40 degrees with AAROM with pain, able to extend elbow mostly, but some forearm deformity from broken arm as a child pt reports, strength elbow flexion 4/5, extension 4-/5, grip WFL LUE Deficits / Details: prosthetic for forearm attached at elbow, able to flex shoulder close to [facility], strength at least 4/5    Lower Extremity Assessment Lower Extremity  Assessment: Generalized weakness(strength 4/5 grossly, limited in weight bearing due to pain at knees)       Communication   Communication: No difficulties  Cognition Arousal/Alertness: Awake/alert Behavior During Therapy: WFL for tasks assessed/performed Overall Cognitive Status: No family/caregiver present to determine baseline cognitive functioning                                 General Comments: likely at baseline       General Comments      Exercises     Assessment/Plan    PT Assessment Patent does not need any further PT services  PT Problem List         PT Treatment Interventions      PT Goals (Current goals can be found in the Care Plan section)  Acute Rehab PT Goals PT Goal Formulation: All assessment and education complete, DC therapy    Frequency     Barriers to discharge        Co-evaluation               AM-PAC PT "6 Clicks" Daily Activity  Outcome Measure Difficulty turning over in bed (including adjusting bedclothes, sheets and blankets)?: A Little Difficulty moving from lying on back to sitting on the side of the bed? : A Little Difficulty sitting down on and standing up from a chair with arms (e.g., wheelchair, bedside commode, etc,.)?: A Lot Help needed moving to and from a bed to chair (including a wheelchair)?: A Little Help needed walking in hospital room?: A Lot Help needed climbing 3-5 steps with a railing? : Total 6 Click Score: 14    End of Session Equipment Utilized During Treatment: Gait belt Activity Tolerance: Patient limited by pain Patient left: in chair;with call bell/phone within reach   PT Visit Diagnosis: Other abnormalities of gait and mobility (R26.89)    Time: 3212-2482 PT Time Calculation (min) (ACUTE ONLY): 14 min   Charges:   PT Evaluation $PT Eval Moderate Complexity: 1 Mod     PT G CodesMagda [name], [place] [phone] 03/06/2018   [name] [name] 03/06/2018, 10:00 AM

## 2018-03-06 NOTE — Clinical Social Work Note (Signed)
Clinical Social Work Assessment  Patient Details  Name: [name] [name] MRN: [id number] Date of Birth: June 05, 1923  Date of referral:  03/06/18               Reason for consult:  Facility Placement                Permission sought to share information with:  Facility Sport and exercise psychologist, Family Supports, Case Optician, dispensing granted to share information::  Yes, Verbal Permission Granted  Name::        Agency::  [facility]  Relationship::     Contact Information:     Housing/Transportation Living arrangements for the past 2 months:  [place] of Information:  Patient, Facility Patient Interpreter Needed:  None Criminal Activity/Legal Involvement Pertinent to Current Situation/Hospitalization:  No - Comment as needed Significant Relationships:  Other Family Members, Community Support Lives with:  Facility Resident Do you feel safe going back to the place where you live?  Yes Need for family participation in patient care:  Yes (Comment)  Care giving concerns:  No care giving concerns at the time of assessment.    Social Worker assessment / plan:  LCSW following for return to facility. No formal consult.  Patient from [facility].   PLAN: Return to ALF at dc.  LCSW met at bedside with patient. Patient is pleasant and reports feeling much better today. She is hopeful she will dc to her facility tomorrow. Patient reports that she has been a resident at [facility] for 3 years. Patient reports that she uses a wheel chair at baseline. Patient will need PTAR to transport to facility.   LCSW spoke with nursing staff at facility.  Facility appears to be concerned about patient and inquired about her dc. Facility reports patient requires assistance with ADLs, but can feed independently.   No PT follow at this time for patient.   Employment status:  Retired Forensic scientist:  Medicare PT Recommendations:  No Follow Up Information / Referral to community  resources:     Patient/Family's Response to care:  Patient is thankful for LCSW visit and dc planning.   Patient/Family's Understanding of and Emotional Response to Diagnosis, Current Treatment, and Prognosis:  Patient is optimistic and states she hopes to go home tomorrow. Patient reports that she is feeling better today.   Emotional Assessment Appearance:  Appears stated age Attitude/Demeanor/Rapport:    Affect (typically observed):  Calm, Pleasant Orientation:  Oriented to Self, Oriented to Place, Oriented to  Time, Oriented to Situation Alcohol / Substance use:  Not Applicable Psych involvement (Current and /or in the community):  No (Comment)  Discharge Needs  Concerns to be addressed:  No discharge needs identified Readmission within the last 30 days:  No Current discharge risk:  None Barriers to Discharge:  Continued Medical Work up   [employer], LCSW 03/06/2018, 4:00 PM

## 2018-03-06 NOTE — Care Management Note (Signed)
Case Management Note  Patient Details  Name: Lorah Kalina MRN: [id number] Date of Birth: 06/13/1923  Subjective/Objective:84 y/o f admitted w/CHF. Hx: DNR, dementia, Endometrial Ca.From ALF-[facility]. PT cons-recc no f/u. Informed CSW about nsg concerns, & facility needing an update.                    Action/Plan:d/c plan ALF   Expected Discharge Date:  (unknown)               Expected Discharge Plan:  Assisted Living / Rest Home  In-House Referral:  Clinical Social Work  Discharge planning Services  CM Consult  Post Acute Care Choice:    Choice offered to:     DME Arranged:    DME Agency:     HH Arranged:    [place] Agency:     Status of Service:  In process, will continue to follow  If discussed at Long Length of Stay Meetings, dates discussed:    Additional Comments:  Dessa Phi, RN 03/06/2018, 11:31 AM

## 2018-03-07 DIAGNOSIS — I5031 Acute diastolic (congestive) heart failure: Secondary | ICD-10-CM

## 2018-03-07 LAB — BASIC METABOLIC PANEL
ANION GAP: 10 (ref 5–15)
BUN: 27 mg/dL — ABNORMAL HIGH (ref 6–20)
CALCIUM: 8.7 mg/dL — AB (ref 8.9–10.3)
CO2: 26 mmol/L (ref 22–32)
Chloride: 104 mmol/L (ref 101–111)
Creatinine, Ser: 1.52 mg/dL — ABNORMAL HIGH (ref 0.44–1.00)
GFR, EST AFRICAN AMERICAN: 33 mL/min — AB (ref >60)
GFR, EST NON AFRICAN AMERICAN: 29 mL/min — AB (ref >60)
Glucose, Bld: 112 mg/dL — ABNORMAL HIGH (ref 65–99)
Potassium: 3.8 mmol/L (ref 3.5–5.1)
Sodium: 140 mmol/L (ref 135–145)

## 2018-03-07 LAB — CBC WITH DIFFERENTIAL/PLATELET
BASOS ABS: 0 10*3/uL (ref 0.0–0.1)
BASOS PCT: 0 %
Eosinophils Absolute: 0.5 10*3/uL (ref 0.0–0.7)
Eosinophils Relative: 5 %
HEMATOCRIT: 34.6 % — AB (ref 36.0–46.0)
HEMOGLOBIN: 11 g/dL — AB (ref 12.0–15.0)
Lymphocytes Relative: 14 %
Lymphs Abs: 1.4 10*3/uL (ref 0.7–4.0)
MCH: 29.5 pg (ref 26.0–34.0)
MCHC: 31.8 g/dL (ref 30.0–36.0)
MCV: 92.8 fL (ref 78.0–100.0)
MONOS PCT: 9 %
Monocytes Absolute: 0.9 10*3/uL (ref 0.1–1.0)
NEUTROS ABS: 6.9 10*3/uL (ref 1.7–7.7)
NEUTROS PCT: 72 %
Platelets: 214 10*3/uL (ref 150–400)
RBC: 3.73 MIL/uL — ABNORMAL LOW (ref 3.87–5.11)
RDW: 15 % (ref 11.5–15.5)
WBC: 9.7 10*3/uL (ref 4.0–10.5)

## 2018-03-07 MED ORDER — BUMETANIDE 1 MG PO TABS
2.0000 mg | ORAL_TABLET | Freq: Every day | ORAL | Status: DC
  Administered 2018-03-07: 2 mg via ORAL
  Filled 2018-03-07: qty 2

## 2018-03-07 MED ORDER — METOPROLOL TARTRATE 25 MG PO TABS: 12.5000 mg | ORAL_TABLET | Freq: Two times a day (BID) | ORAL | Status: DC

## 2018-03-07 MED ORDER — TRAMADOL HCL 50 MG PO TABS: 50.0000 mg | ORAL_TABLET | Freq: Three times a day (TID) | ORAL | 0 refills | Status: DC | PRN

## 2018-03-07 MED ORDER — TRIAMCINOLONE ACETONIDE 0.1 % EX CREA
TOPICAL_CREAM | Freq: Two times a day (BID) | CUTANEOUS | Status: DC
  Filled 2018-03-07: qty 15

## 2018-03-07 NOTE — Progress Notes (Signed)
CSW following to assist patient with discharge back to [facility]F.  CSW sent clinical documents to [facility]. CSW contacted [facility] and spoke with staff member Amy, staff confirmed patient's ability to return.  CSW contacted by [address] [place] and informed that patient has left the hospital with family to transport back to ALF.   CSW contacted patient's RN and provided number to call report. Patient's RN reported that she provided patient with packet.   CSW contacted [facility] and informed staff member Amy that patient is in route to ALF with family, staff replied okay.   CSW signing off, no other needs identified at this time.  Abundio Miu, [place] Social Worker [facility] Cell#: [phone]

## 2018-03-07 NOTE — NC FL2 (Addendum)
[place] LEVEL OF CARE SCREENING TOOL     IDENTIFICATION  Patient Name: [name] [name] Birthdate: 04/20/1923 Sex: female Admission Date (Current Location): 03/05/2018  [facility] and [place] Number:  Herbalist and Address:  [facility],  [place] [address] [place]      Provider Number: [id number]  Attending Physician Name and Address:  Patrecia Pour, MD  Relative Name and Phone Number:       Current Level of Care: Hospital Recommended Level of Care: [place] Prior Approval Number:    Date Approved/Denied:   PASRR Number:    Discharge Plan: Other (Comment)(ALF)    Current Diagnoses: Patient Active Problem List   Diagnosis Date Noted  . Heart failure, diastolic, acute on chronic ([place]) 03/05/2018  . Acute congestive heart failure ([place])   . Sepsis ([place]) 10/12/2016  . Sinus tachycardia 10/12/2016  . HTN (hypertension) 10/12/2016  . COPD exacerbation ([place])   . CAP (community acquired pneumonia) 10/11/2016  . Colostomy in place [facility]) 12/10/2013    Orientation RESPIRATION BLADDER Height & Weight     Self, Time, Situation, Place  Normal Continent Weight: 194 lb 0.1 oz (88 kg) Height:  5\' 6"  (167.6 cm)  BEHAVIORAL SYMPTOMS/MOOD NEUROLOGICAL BOWEL NUTRITION STATUS      Continent Diet(general)  AMBULATORY STATUS COMMUNICATION OF NEEDS Skin   Limited Assist Verbally Normal                       Personal Care Assistance Level of Assistance  Bathing, Feeding, Dressing Bathing Assistance: Limited assistance Feeding assistance: Independent Dressing Assistance: Limited assistance     Functional Limitations Info  Sight, Hearing, Speech Sight Info: Impaired Hearing Info: Adequate Speech Info: Adequate    SPECIAL CARE FACTORS FREQUENCY                       Contractures Contractures Info: Not present    Additional Factors Info  Code Status, Allergies Code Status Info: DNR Allergies Info: Sulfa  Antibiotics;Penicillins              Discharge Medications:  Medication List    TAKE these medications   acetaminophen 325 MG tablet Commonly known as:  TYLENOL Take 650 mg by mouth daily. At 2 PM   albuterol 108 (90 Base) MCG/ACT inhaler Commonly known as:  PROVENTIL HFA;VENTOLIN HFA Inhale 2 puffs into the lungs every 6 (six) hours as needed for wheezing or shortness of breath.   benzonatate 100 MG capsule Commonly known as:  TESSALON Take 100 mg by mouth 3 (three) times daily as needed for cough.   bumetanide 1 MG tablet Commonly known as:  BUMEX Take 2 mg by mouth daily.   fluticasone 50 MCG/ACT nasal spray Commonly known as:  FLONASE Place 2 sprays into both nostrils daily.   guaiFENesin-dextromethorphan 100-10 MG/5ML syrup Commonly known as:  ROBITUSSIN DM Take 5 mLs by mouth every 4 (four) hours as needed for cough.   ipratropium-albuterol 0.5-2.5 (3) MG/3ML Soln Commonly known as:  DUONEB Take 3 mLs by nebulization 3 (three) times daily.   KLOR-CON 20 MEQ packet Generic drug:  potassium chloride Take by mouth 2 (two) times daily.   levothyroxine 75 MCG tablet Commonly known as:  SYNTHROID, LEVOTHROID Take 75 mcg by mouth daily before breakfast.   loratadine 10 MG tablet Commonly known as:  CLARITIN Take 10 mg by mouth daily.   Melatonin 5 MG Tabs  Take 1 tablet by mouth at bedtime.   metoprolol tartrate 25 MG tablet Commonly known as:  LOPRESSOR Take 0.5 tablets (12.5 mg total) by mouth 2 (two) times daily. What changed:  how much to take   traMADol 50 MG tablet Commonly known as:  ULTRAM Take 1 tablet (50 mg total) by mouth every 8 (eight) hours as needed (pain). Up to 3 times daily as needed  and at bedtime What changed:    when to take this  reasons to take this   traZODone 50 MG tablet Commonly known as:  DESYREL Take 50 mg by mouth at bedtime.   triamcinolone 0.025 % cream Commonly known as:  KENALOG Apply 1  application topically 2 (two) times daily as needed (for rash     Relevant Imaging Results:  Relevant Lab Results:   Additional Information SSN [id number]  Burnis Medin, LCSW

## 2018-03-07 NOTE — Care Management Note (Signed)
Case Management Note  Patient Details  Name: [name] [name] MRN: [id number] Date of Birth: [birth year]  Subjective/Objective: d/c back to ALF-[facility]. Already has home 02. PT-no f/u.Per nsg family will transp back to ALF.                   Action/Plan:d/c ALF   Expected Discharge Date:  03/07/18               Expected Discharge Plan:  Assisted Living / Rest Home  In-House Referral:  Clinical Social Work  Discharge planning Services  CM Consult  Post Acute Care Choice:  (Has home 02) Choice offered to:     DME Arranged:    DME Agency:     HH Arranged:    [place] Agency:     Status of Service:  Completed, signed off  If discussed at [employer], dates discussed:    Additional Comments:  Dessa Phi, RN 03/07/2018, 1:32 PM

## 2018-03-07 NOTE — Discharge Summary (Addendum)
Physician Discharge Summary  [name] [name] QIW:[id number] DOB: 06-Jun-1923 DOA: 03/05/2018  PCP: Lajean Manes, MD  Admit date: 03/05/2018 Discharge date: 03/07/2018  Admitted From: ALF Disposition: ALF   Recommendations for Outpatient Follow-up:  1. Follow up with PCP in 1-2 weeks 2. Please obtain BMP/CBC in one week 3. Follow up BP, HR and titrate metoprolol as needed 4. Follow up daily weights/volume status and titrate bumex as needed. 5. Follow up pruritic patch on left neck, continue triamcinolone prn, though duration has been prolonged and may warrant biopsy.  Home Health: None new Equipment/Devices: None new Discharge Condition: Stable CODE STATUS: DNR Diet recommendation: Heart healthy  Brief/Interim Summary: [name] [name] is a 84 year old female with medical history significant for dementia, HFpEF, moderate pulmonary hypertension, chronic hypoxic respiratory failure on as needed home O2 (2L), undocumented COPD, diverticulitis status post colostomy, endometrial cancer, presented to the ED from her ALF with progressive shortness of breath, worse when lying flat and minimal exertion for the past couple of days.  She reports a chronic dry cough which has not changed recently.  Denies any other symptoms.  In the ED, patient was noted to be hypertensive, saturating in the mid 90s on 4 L of oxygen, BNP 797 up from baseline, chest x-ray showed cardiomegaly with pulmonary vascular congestion, emphysematous changes.  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 Patient admitted for further management and improved with IV lasix. Renal function remained stable. She has improved to her baseline respiratory status and is stable for discharge. Will need follow up for further management.  Discharge Diagnoses:  Active Problems:   Heart failure, diastolic, acute on chronic (HCC)   Acute congestive heart failure (HCC)  Acute on chronic diastolic heart failure: Echo  6/4 showed EF 60-65%, G1DD, mild pulmonary HTN (improved from previous echo). BNP 797, up from 117 in 6/18. Troponin negative, EKG with normal sinus rhythm, with multiple PVCs. Chest x-ray showed cardiomegaly with pulmonary vascular congestion, emphysematous changes - Restart home bumex; weights stable, though diuresis has continued with IV lasix and improvement.  - Monitor daily weights.   Acute on chronic hypoxic respiratory failure: Improved. Off oxygen. - Continue supplemental oxygen if needed, duonebs  Hypertension: Labile, improved once on home metoprolol.  - Reduce home metoprolol dose with parameters as patient noted to have episodes of bradycardia (if persistent, may consider DC, although patient has multiple PVCs)  COPD: Stable. Chest x-ray with emphysematous changes - Continue duo nebs, supplemental oxygen   CKD stage IV: Baseline creatinine 1.4-1.5 - Monitor BMP and avoid nephrotoxins.  Anemia of CKD: Currently at baseline - Monitor CBC  Hypothyroidism: TSH within normal limits - Continue Synthroid  Left neck pruritic patch: Most consistent with eczema, but denies h/o same. Is on topical steroids PTA, so suspect this has been treatment. She states it's been there for several months.  - Continue topical kenalog - Consider biopsy if not resolving. This was discussed with the patient.  Discharge Instructions Discharge Instructions    (HEART FAILURE PATIENTS) Call MD:  Anytime you have any of the following symptoms: 1) 3 pound weight gain in 24 hours or 5 pounds in 1 week 2) shortness of breath, with or without a dry hacking cough 3) swelling in the hands, feet or stomach 4) if you have to sleep on extra pillows at night in order to breathe.   Complete by:  As directed    Diet - low sodium heart healthy  Complete by:  As directed    Increase activity slowly   Complete by:  As directed      Allergies as of 03/07/2018      Reactions   Sulfa Antibiotics    Listed on  MAR   Penicillins Itching, Rash   Tolerates ceftriaxone and cefdinir  Has patient had a PCN reaction causing immediate rash, facial/tongue/throat swelling, SOB or lightheadedness with hypotension: Yes Has patient had a PCN reaction causing severe rash involving mucus membranes or skin necrosis: unknown Has patient had a PCN reaction that required hospitalization: Unknown Has patient had a PCN reaction occurring within the last 10 years: Unknown If all of the above answers are "NO", then may proceed with Cephalosporin use.      Medication List    TAKE these medications   acetaminophen 325 MG tablet Commonly known as:  TYLENOL Take 650 mg by mouth daily. At 2 PM   albuterol 108 (90 Base) MCG/ACT inhaler Commonly known as:  PROVENTIL HFA;VENTOLIN HFA Inhale 2 puffs into the lungs every 6 (six) hours as needed for wheezing or shortness of breath.   benzonatate 100 MG capsule Commonly known as:  TESSALON Take 100 mg by mouth 3 (three) times daily as needed for cough.   bumetanide 1 MG tablet Commonly known as:  BUMEX Take 2 mg by mouth daily.   fluticasone 50 MCG/ACT nasal spray Commonly known as:  FLONASE Place 2 sprays into both nostrils daily.   guaiFENesin-dextromethorphan 100-10 MG/5ML syrup Commonly known as:  ROBITUSSIN DM Take 5 mLs by mouth every 4 (four) hours as needed for cough.   ipratropium-albuterol 0.5-2.5 (3) MG/3ML Soln Commonly known as:  DUONEB Take 3 mLs by nebulization 3 (three) times daily.   KLOR-CON 20 MEQ packet Generic drug:  potassium chloride Take by mouth 2 (two) times daily.   levothyroxine 75 MCG tablet Commonly known as:  SYNTHROID, LEVOTHROID Take 75 mcg by mouth daily before breakfast.   loratadine 10 MG tablet Commonly known as:  CLARITIN Take 10 mg by mouth daily.   Melatonin 5 MG Tabs Take 1 tablet by mouth at bedtime.   metoprolol tartrate 25 MG tablet Commonly known as:  LOPRESSOR Take 0.5 tablets (12.5 mg total) by mouth 2  (two) times daily. What changed:  how much to take   traMADol 50 MG tablet Commonly known as:  ULTRAM Take 1 tablet (50 mg total) by mouth every 8 (eight) hours as needed (pain). Up to 3 times daily as needed  and at bedtime What changed:    when to take this  reasons to take this   traZODone 50 MG tablet Commonly known as:  DESYREL Take 50 mg by mouth at bedtime.   triamcinolone 0.025 % cream Commonly known as:  KENALOG Apply 1 application topically 2 (two) times daily as needed (for rash).      Follow-up Information    Lajean Manes, MD Follow up.   Specialty:  Internal Medicine Contact information: [address] [place] [address] [phone]          Allergies  Allergen Reactions  . Sulfa Antibiotics     Listed on MAR  . Penicillins Itching and Rash    Tolerates ceftriaxone and cefdinir  Has patient had a PCN reaction causing immediate rash, facial/tongue/throat swelling, SOB or lightheadedness with hypotension: Yes Has patient had a PCN reaction causing severe rash involving mucus membranes or skin necrosis: unknown Has patient had a PCN reaction  that required hospitalization: Unknown Has patient had a PCN reaction occurring within the last 10 years: Unknown If all of the above answers are "NO", then may proceed with Cephalosporin use.     Consultations:  None  Procedures/Studies: Dg Chest 2 View  Result Date: 03/05/2018 CLINICAL DATA:  Shortness of breath, history coronary artery disease, dementia, hypertension, former smoker EXAM: CHEST - 2 VIEW COMPARISON:  03/16/2017 FINDINGS: Enlargement of cardiac silhouette with pulmonary vascular congestion. Atherosclerotic calcification aorta. Chronic interstitial changes at the RIGHT upper lobe. Underlying emphysematous changes and chronic bibasilar opacities. No gross acute infiltrate, pleural effusion or pneumothorax. Advanced RIGHT glenohumeral degenerative changes. Osseous demineralization.  IMPRESSION: Enlargement of cardiac silhouette with pulmonary vascular congestion. Emphysematous changes with chronic bibasilar opacities and probable RIGHT upper lobe scarring. No definite acute abnormalities. Electronically Signed   By: Lavonia Dana M.D.   On: 03/05/2018 16:26   Echocardiogram 03/06/2018: - Left ventricle: The cavity size was normal. Systolic function was   normal. The estimated ejection fraction was in the range of 60%   to 65%. Wall motion was normal; there were no regional wall   motion abnormalities. There was an increased relative   contribution of atrial contraction to ventricular filling.   Doppler parameters are consistent with abnormal left ventricular   relaxation (grade 1 diastolic dysfunction). Doppler parameters   are consistent with high ventricular filling pressure. - Mitral valve: There was trivial regurgitation. - Left atrium: The atrium was mildly dilated. - Tricuspid valve: There was trivial regurgitation. - Pulmonary arteries: PA peak pressure: 39 mm Hg (S).  Impressions:  - The right ventricular systolic pressure was increased consistent   with mild pulmonary hypertension.  Subjective: Breathing back to baseline, improved from admission. Had leg swelling which has resolved. Making good urine. No chest pain.  Discharge Exam: Vitals:   03/07/18 0806 03/07/18 0918  BP:  (!) 123/46  Pulse: 71 86  Resp: 18   Temp:    SpO2: 93%    General: Pt is alert, awake, not in acute distress Cardiovascular: RRR, S1/S2 +, no rubs, no gallops Respiratory: Nonlabored and clear. No wheezes or crackles. Diminished.  Abdominal: Soft, NT, ND, bowel sounds + Extremities: Trace, very minimal LE edema, no cyanosis  Labs: BNP (last 3 results) Recent Labs    03/16/17 2039 03/05/18 1658  BNP 117.3* 329.9*   Basic Metabolic Panel: Recent Labs  Lab 03/05/18 1655 03/05/18 2050 03/06/18 0234 03/07/18 0449  NA 141  --  142 140  K 4.0  --  3.6 3.8  CL 108  --   108 104  CO2 24  --  23 26  GLUCOSE 98  --  109* 112*  BUN 29*  --  27* 27*  CREATININE 1.46*  --  1.54* 1.52*  CALCIUM 8.5*  --  8.4* 8.7*  MG  --  2.1 2.5*  --   PHOS  --  3.4  --   --    CBC: Recent Labs  Lab 03/05/18 1655 03/06/18 0234 03/07/18 0449  WBC 9.4 9.6 9.7  NEUTROABS 7.1  --  6.9  HGB 10.6* 10.2* 11.0*  HCT 33.4* 32.0* 34.6*  MCV 93.0 93.0 92.8  PLT 203 189 214   Cardiac Enzymes: Recent Labs  Lab 03/05/18 1655 03/05/18 2050 03/06/18 0234  TROPONINI <0.03 <0.03 <0.03   Thyroid function studies Recent Labs    03/05/18 2050  TSH 1.288    Time coordinating discharge: Approximately 40 minutes  Patrecia Pour,  MD  [facility] 03/07/2018, 12:12 PM Pager [phone]

## 2018-03-11 DIAGNOSIS — I130 Hypertensive heart and chronic kidney disease with heart failure and stage 1 through stage 4 chronic kidney disease, or unspecified chronic kidney disease: Secondary | ICD-10-CM | POA: Diagnosis not present

## 2018-03-11 DIAGNOSIS — D631 Anemia in chronic kidney disease: Secondary | ICD-10-CM | POA: Diagnosis not present

## 2018-03-11 DIAGNOSIS — J449 Chronic obstructive pulmonary disease, unspecified: Secondary | ICD-10-CM | POA: Diagnosis not present

## 2018-03-11 DIAGNOSIS — I5033 Acute on chronic diastolic (congestive) heart failure: Secondary | ICD-10-CM | POA: Diagnosis not present

## 2018-03-11 DIAGNOSIS — J9621 Acute and chronic respiratory failure with hypoxia: Secondary | ICD-10-CM | POA: Diagnosis not present

## 2018-03-11 DIAGNOSIS — N184 Chronic kidney disease, stage 4 (severe): Secondary | ICD-10-CM | POA: Diagnosis not present

## 2018-03-14 DIAGNOSIS — I5030 Unspecified diastolic (congestive) heart failure: Secondary | ICD-10-CM | POA: Diagnosis not present

## 2018-03-14 DIAGNOSIS — N183 Chronic kidney disease, stage 3 (moderate): Secondary | ICD-10-CM | POA: Diagnosis not present

## 2018-03-14 DIAGNOSIS — I129 Hypertensive chronic kidney disease with stage 1 through stage 4 chronic kidney disease, or unspecified chronic kidney disease: Secondary | ICD-10-CM | POA: Diagnosis not present

## 2018-03-14 DIAGNOSIS — Z79899 Other long term (current) drug therapy: Secondary | ICD-10-CM | POA: Diagnosis not present

## 2018-03-15 DIAGNOSIS — I130 Hypertensive heart and chronic kidney disease with heart failure and stage 1 through stage 4 chronic kidney disease, or unspecified chronic kidney disease: Secondary | ICD-10-CM | POA: Diagnosis not present

## 2018-03-15 DIAGNOSIS — I5033 Acute on chronic diastolic (congestive) heart failure: Secondary | ICD-10-CM | POA: Diagnosis not present

## 2018-03-15 DIAGNOSIS — N184 Chronic kidney disease, stage 4 (severe): Secondary | ICD-10-CM | POA: Diagnosis not present

## 2018-03-15 DIAGNOSIS — D631 Anemia in chronic kidney disease: Secondary | ICD-10-CM | POA: Diagnosis not present

## 2018-03-15 DIAGNOSIS — J9621 Acute and chronic respiratory failure with hypoxia: Secondary | ICD-10-CM | POA: Diagnosis not present

## 2018-03-15 DIAGNOSIS — J449 Chronic obstructive pulmonary disease, unspecified: Secondary | ICD-10-CM | POA: Diagnosis not present

## 2018-03-20 DIAGNOSIS — J449 Chronic obstructive pulmonary disease, unspecified: Secondary | ICD-10-CM | POA: Diagnosis not present

## 2018-03-20 DIAGNOSIS — D631 Anemia in chronic kidney disease: Secondary | ICD-10-CM | POA: Diagnosis not present

## 2018-03-20 DIAGNOSIS — I130 Hypertensive heart and chronic kidney disease with heart failure and stage 1 through stage 4 chronic kidney disease, or unspecified chronic kidney disease: Secondary | ICD-10-CM | POA: Diagnosis not present

## 2018-03-20 DIAGNOSIS — N184 Chronic kidney disease, stage 4 (severe): Secondary | ICD-10-CM | POA: Diagnosis not present

## 2018-03-20 DIAGNOSIS — I5033 Acute on chronic diastolic (congestive) heart failure: Secondary | ICD-10-CM | POA: Diagnosis not present

## 2018-03-20 DIAGNOSIS — J9621 Acute and chronic respiratory failure with hypoxia: Secondary | ICD-10-CM | POA: Diagnosis not present

## 2018-03-22 DIAGNOSIS — D631 Anemia in chronic kidney disease: Secondary | ICD-10-CM | POA: Diagnosis not present

## 2018-03-22 DIAGNOSIS — J449 Chronic obstructive pulmonary disease, unspecified: Secondary | ICD-10-CM | POA: Diagnosis not present

## 2018-03-22 DIAGNOSIS — I5033 Acute on chronic diastolic (congestive) heart failure: Secondary | ICD-10-CM | POA: Diagnosis not present

## 2018-03-22 DIAGNOSIS — J9621 Acute and chronic respiratory failure with hypoxia: Secondary | ICD-10-CM | POA: Diagnosis not present

## 2018-03-22 DIAGNOSIS — I130 Hypertensive heart and chronic kidney disease with heart failure and stage 1 through stage 4 chronic kidney disease, or unspecified chronic kidney disease: Secondary | ICD-10-CM | POA: Diagnosis not present

## 2018-03-22 DIAGNOSIS — N184 Chronic kidney disease, stage 4 (severe): Secondary | ICD-10-CM | POA: Diagnosis not present

## 2018-03-27 DIAGNOSIS — J449 Chronic obstructive pulmonary disease, unspecified: Secondary | ICD-10-CM | POA: Diagnosis not present

## 2018-03-27 DIAGNOSIS — D631 Anemia in chronic kidney disease: Secondary | ICD-10-CM | POA: Diagnosis not present

## 2018-03-27 DIAGNOSIS — I130 Hypertensive heart and chronic kidney disease with heart failure and stage 1 through stage 4 chronic kidney disease, or unspecified chronic kidney disease: Secondary | ICD-10-CM | POA: Diagnosis not present

## 2018-03-27 DIAGNOSIS — I5033 Acute on chronic diastolic (congestive) heart failure: Secondary | ICD-10-CM | POA: Diagnosis not present

## 2018-03-27 DIAGNOSIS — J9621 Acute and chronic respiratory failure with hypoxia: Secondary | ICD-10-CM | POA: Diagnosis not present

## 2018-03-27 DIAGNOSIS — N184 Chronic kidney disease, stage 4 (severe): Secondary | ICD-10-CM | POA: Diagnosis not present

## 2018-03-29 DIAGNOSIS — I5033 Acute on chronic diastolic (congestive) heart failure: Secondary | ICD-10-CM | POA: Diagnosis not present

## 2018-03-29 DIAGNOSIS — D631 Anemia in chronic kidney disease: Secondary | ICD-10-CM | POA: Diagnosis not present

## 2018-03-29 DIAGNOSIS — N184 Chronic kidney disease, stage 4 (severe): Secondary | ICD-10-CM | POA: Diagnosis not present

## 2018-03-29 DIAGNOSIS — I130 Hypertensive heart and chronic kidney disease with heart failure and stage 1 through stage 4 chronic kidney disease, or unspecified chronic kidney disease: Secondary | ICD-10-CM | POA: Diagnosis not present

## 2018-03-29 DIAGNOSIS — J449 Chronic obstructive pulmonary disease, unspecified: Secondary | ICD-10-CM | POA: Diagnosis not present

## 2018-03-29 DIAGNOSIS — J9621 Acute and chronic respiratory failure with hypoxia: Secondary | ICD-10-CM | POA: Diagnosis not present

## 2018-04-03 DIAGNOSIS — I5033 Acute on chronic diastolic (congestive) heart failure: Secondary | ICD-10-CM | POA: Diagnosis not present

## 2018-04-03 DIAGNOSIS — D631 Anemia in chronic kidney disease: Secondary | ICD-10-CM | POA: Diagnosis not present

## 2018-04-03 DIAGNOSIS — N184 Chronic kidney disease, stage 4 (severe): Secondary | ICD-10-CM | POA: Diagnosis not present

## 2018-04-03 DIAGNOSIS — I130 Hypertensive heart and chronic kidney disease with heart failure and stage 1 through stage 4 chronic kidney disease, or unspecified chronic kidney disease: Secondary | ICD-10-CM | POA: Diagnosis not present

## 2018-04-03 DIAGNOSIS — J9621 Acute and chronic respiratory failure with hypoxia: Secondary | ICD-10-CM | POA: Diagnosis not present

## 2018-04-03 DIAGNOSIS — J449 Chronic obstructive pulmonary disease, unspecified: Secondary | ICD-10-CM | POA: Diagnosis not present

## 2018-04-05 DIAGNOSIS — I130 Hypertensive heart and chronic kidney disease with heart failure and stage 1 through stage 4 chronic kidney disease, or unspecified chronic kidney disease: Secondary | ICD-10-CM | POA: Diagnosis not present

## 2018-04-05 DIAGNOSIS — N184 Chronic kidney disease, stage 4 (severe): Secondary | ICD-10-CM | POA: Diagnosis not present

## 2018-04-05 DIAGNOSIS — J449 Chronic obstructive pulmonary disease, unspecified: Secondary | ICD-10-CM | POA: Diagnosis not present

## 2018-04-05 DIAGNOSIS — J9621 Acute and chronic respiratory failure with hypoxia: Secondary | ICD-10-CM | POA: Diagnosis not present

## 2018-04-05 DIAGNOSIS — D631 Anemia in chronic kidney disease: Secondary | ICD-10-CM | POA: Diagnosis not present

## 2018-04-05 DIAGNOSIS — I5033 Acute on chronic diastolic (congestive) heart failure: Secondary | ICD-10-CM | POA: Diagnosis not present

## 2018-04-10 DIAGNOSIS — J9621 Acute and chronic respiratory failure with hypoxia: Secondary | ICD-10-CM | POA: Diagnosis not present

## 2018-04-10 DIAGNOSIS — N184 Chronic kidney disease, stage 4 (severe): Secondary | ICD-10-CM | POA: Diagnosis not present

## 2018-04-10 DIAGNOSIS — I5033 Acute on chronic diastolic (congestive) heart failure: Secondary | ICD-10-CM | POA: Diagnosis not present

## 2018-04-10 DIAGNOSIS — D631 Anemia in chronic kidney disease: Secondary | ICD-10-CM | POA: Diagnosis not present

## 2018-04-10 DIAGNOSIS — I130 Hypertensive heart and chronic kidney disease with heart failure and stage 1 through stage 4 chronic kidney disease, or unspecified chronic kidney disease: Secondary | ICD-10-CM | POA: Diagnosis not present

## 2018-04-10 DIAGNOSIS — J449 Chronic obstructive pulmonary disease, unspecified: Secondary | ICD-10-CM | POA: Diagnosis not present

## 2018-04-13 DIAGNOSIS — I130 Hypertensive heart and chronic kidney disease with heart failure and stage 1 through stage 4 chronic kidney disease, or unspecified chronic kidney disease: Secondary | ICD-10-CM | POA: Diagnosis not present

## 2018-04-13 DIAGNOSIS — J449 Chronic obstructive pulmonary disease, unspecified: Secondary | ICD-10-CM | POA: Diagnosis not present

## 2018-04-13 DIAGNOSIS — J9621 Acute and chronic respiratory failure with hypoxia: Secondary | ICD-10-CM | POA: Diagnosis not present

## 2018-04-13 DIAGNOSIS — D631 Anemia in chronic kidney disease: Secondary | ICD-10-CM | POA: Diagnosis not present

## 2018-04-13 DIAGNOSIS — I5033 Acute on chronic diastolic (congestive) heart failure: Secondary | ICD-10-CM | POA: Diagnosis not present

## 2018-04-13 DIAGNOSIS — N184 Chronic kidney disease, stage 4 (severe): Secondary | ICD-10-CM | POA: Diagnosis not present

## 2018-04-17 DIAGNOSIS — D631 Anemia in chronic kidney disease: Secondary | ICD-10-CM | POA: Diagnosis not present

## 2018-04-17 DIAGNOSIS — N184 Chronic kidney disease, stage 4 (severe): Secondary | ICD-10-CM | POA: Diagnosis not present

## 2018-04-17 DIAGNOSIS — I5033 Acute on chronic diastolic (congestive) heart failure: Secondary | ICD-10-CM | POA: Diagnosis not present

## 2018-04-17 DIAGNOSIS — J449 Chronic obstructive pulmonary disease, unspecified: Secondary | ICD-10-CM | POA: Diagnosis not present

## 2018-04-17 DIAGNOSIS — I130 Hypertensive heart and chronic kidney disease with heart failure and stage 1 through stage 4 chronic kidney disease, or unspecified chronic kidney disease: Secondary | ICD-10-CM | POA: Diagnosis not present

## 2018-04-17 DIAGNOSIS — J9621 Acute and chronic respiratory failure with hypoxia: Secondary | ICD-10-CM | POA: Diagnosis not present

## 2018-04-19 DIAGNOSIS — I130 Hypertensive heart and chronic kidney disease with heart failure and stage 1 through stage 4 chronic kidney disease, or unspecified chronic kidney disease: Secondary | ICD-10-CM | POA: Diagnosis not present

## 2018-04-19 DIAGNOSIS — J9621 Acute and chronic respiratory failure with hypoxia: Secondary | ICD-10-CM | POA: Diagnosis not present

## 2018-04-19 DIAGNOSIS — I5033 Acute on chronic diastolic (congestive) heart failure: Secondary | ICD-10-CM | POA: Diagnosis not present

## 2018-04-19 DIAGNOSIS — J449 Chronic obstructive pulmonary disease, unspecified: Secondary | ICD-10-CM | POA: Diagnosis not present

## 2018-04-19 DIAGNOSIS — N184 Chronic kidney disease, stage 4 (severe): Secondary | ICD-10-CM | POA: Diagnosis not present

## 2018-04-19 DIAGNOSIS — D631 Anemia in chronic kidney disease: Secondary | ICD-10-CM | POA: Diagnosis not present

## 2018-04-24 DIAGNOSIS — N184 Chronic kidney disease, stage 4 (severe): Secondary | ICD-10-CM | POA: Diagnosis not present

## 2018-04-24 DIAGNOSIS — J9621 Acute and chronic respiratory failure with hypoxia: Secondary | ICD-10-CM | POA: Diagnosis not present

## 2018-04-24 DIAGNOSIS — J449 Chronic obstructive pulmonary disease, unspecified: Secondary | ICD-10-CM | POA: Diagnosis not present

## 2018-04-24 DIAGNOSIS — I130 Hypertensive heart and chronic kidney disease with heart failure and stage 1 through stage 4 chronic kidney disease, or unspecified chronic kidney disease: Secondary | ICD-10-CM | POA: Diagnosis not present

## 2018-04-24 DIAGNOSIS — D631 Anemia in chronic kidney disease: Secondary | ICD-10-CM | POA: Diagnosis not present

## 2018-04-24 DIAGNOSIS — I5033 Acute on chronic diastolic (congestive) heart failure: Secondary | ICD-10-CM | POA: Diagnosis not present

## 2018-04-27 DIAGNOSIS — D631 Anemia in chronic kidney disease: Secondary | ICD-10-CM | POA: Diagnosis not present

## 2018-04-27 DIAGNOSIS — I130 Hypertensive heart and chronic kidney disease with heart failure and stage 1 through stage 4 chronic kidney disease, or unspecified chronic kidney disease: Secondary | ICD-10-CM | POA: Diagnosis not present

## 2018-04-27 DIAGNOSIS — J9621 Acute and chronic respiratory failure with hypoxia: Secondary | ICD-10-CM | POA: Diagnosis not present

## 2018-04-27 DIAGNOSIS — N184 Chronic kidney disease, stage 4 (severe): Secondary | ICD-10-CM | POA: Diagnosis not present

## 2018-04-27 DIAGNOSIS — I5033 Acute on chronic diastolic (congestive) heart failure: Secondary | ICD-10-CM | POA: Diagnosis not present

## 2018-04-27 DIAGNOSIS — J449 Chronic obstructive pulmonary disease, unspecified: Secondary | ICD-10-CM | POA: Diagnosis not present

## 2018-04-30 DIAGNOSIS — J449 Chronic obstructive pulmonary disease, unspecified: Secondary | ICD-10-CM | POA: Diagnosis not present

## 2018-04-30 DIAGNOSIS — J9621 Acute and chronic respiratory failure with hypoxia: Secondary | ICD-10-CM | POA: Diagnosis not present

## 2018-04-30 DIAGNOSIS — D631 Anemia in chronic kidney disease: Secondary | ICD-10-CM | POA: Diagnosis not present

## 2018-04-30 DIAGNOSIS — N184 Chronic kidney disease, stage 4 (severe): Secondary | ICD-10-CM | POA: Diagnosis not present

## 2018-04-30 DIAGNOSIS — I130 Hypertensive heart and chronic kidney disease with heart failure and stage 1 through stage 4 chronic kidney disease, or unspecified chronic kidney disease: Secondary | ICD-10-CM | POA: Diagnosis not present

## 2018-04-30 DIAGNOSIS — I5033 Acute on chronic diastolic (congestive) heart failure: Secondary | ICD-10-CM | POA: Diagnosis not present

## 2018-05-02 DIAGNOSIS — I130 Hypertensive heart and chronic kidney disease with heart failure and stage 1 through stage 4 chronic kidney disease, or unspecified chronic kidney disease: Secondary | ICD-10-CM | POA: Diagnosis not present

## 2018-05-02 DIAGNOSIS — I5033 Acute on chronic diastolic (congestive) heart failure: Secondary | ICD-10-CM | POA: Diagnosis not present

## 2018-05-02 DIAGNOSIS — J449 Chronic obstructive pulmonary disease, unspecified: Secondary | ICD-10-CM | POA: Diagnosis not present

## 2018-05-02 DIAGNOSIS — N184 Chronic kidney disease, stage 4 (severe): Secondary | ICD-10-CM | POA: Diagnosis not present

## 2018-05-02 DIAGNOSIS — D631 Anemia in chronic kidney disease: Secondary | ICD-10-CM | POA: Diagnosis not present

## 2018-05-02 DIAGNOSIS — J9621 Acute and chronic respiratory failure with hypoxia: Secondary | ICD-10-CM | POA: Diagnosis not present

## 2018-05-15 DIAGNOSIS — I700 Atherosclerosis of aorta: Secondary | ICD-10-CM | POA: Diagnosis not present

## 2018-05-15 DIAGNOSIS — M25511 Pain in right shoulder: Secondary | ICD-10-CM | POA: Diagnosis not present

## 2018-05-15 DIAGNOSIS — I5030 Unspecified diastolic (congestive) heart failure: Secondary | ICD-10-CM | POA: Diagnosis not present

## 2018-05-15 DIAGNOSIS — N183 Chronic kidney disease, stage 3 (moderate): Secondary | ICD-10-CM | POA: Diagnosis not present

## 2018-05-15 DIAGNOSIS — G8929 Other chronic pain: Secondary | ICD-10-CM | POA: Diagnosis not present

## 2018-05-15 DIAGNOSIS — Z0000 Encounter for general adult medical examination without abnormal findings: Secondary | ICD-10-CM | POA: Diagnosis not present

## 2018-05-15 DIAGNOSIS — Z1389 Encounter for screening for other disorder: Secondary | ICD-10-CM | POA: Diagnosis not present

## 2018-05-15 DIAGNOSIS — I129 Hypertensive chronic kidney disease with stage 1 through stage 4 chronic kidney disease, or unspecified chronic kidney disease: Secondary | ICD-10-CM | POA: Diagnosis not present

## 2018-05-15 DIAGNOSIS — Z933 Colostomy status: Secondary | ICD-10-CM | POA: Diagnosis not present

## 2018-05-15 DIAGNOSIS — R21 Rash and other nonspecific skin eruption: Secondary | ICD-10-CM | POA: Diagnosis not present

## 2018-05-15 DIAGNOSIS — Z79899 Other long term (current) drug therapy: Secondary | ICD-10-CM | POA: Diagnosis not present

## 2018-05-15 DIAGNOSIS — E039 Hypothyroidism, unspecified: Secondary | ICD-10-CM | POA: Diagnosis not present

## 2018-06-30 ENCOUNTER — Encounter (HOSPITAL_COMMUNITY): Payer: Self-pay

## 2018-06-30 ENCOUNTER — Emergency Department (HOSPITAL_COMMUNITY): Payer: Medicare Other

## 2018-06-30 ENCOUNTER — Other Ambulatory Visit: Payer: Self-pay

## 2018-06-30 ENCOUNTER — Inpatient Hospital Stay (HOSPITAL_COMMUNITY)
Admission: EM | Admit: 2018-06-30 | Discharge: 2018-07-08 | DRG: 291 | Disposition: A | Discharge status: Nursing Home (Includes ICF) | Payer: Medicare Other | Source: Skilled Nursing Facility | Attending: Family Medicine | Admitting: Family Medicine

## 2018-06-30 DIAGNOSIS — J9811 Atelectasis: Secondary | ICD-10-CM | POA: Diagnosis present

## 2018-06-30 DIAGNOSIS — N179 Acute kidney failure, unspecified: Secondary | ICD-10-CM | POA: Diagnosis not present

## 2018-06-30 DIAGNOSIS — J9600 Acute respiratory failure, unspecified whether with hypoxia or hypercapnia: Secondary | ICD-10-CM | POA: Diagnosis not present

## 2018-06-30 DIAGNOSIS — I5033 Acute on chronic diastolic (congestive) heart failure: Secondary | ICD-10-CM | POA: Diagnosis present

## 2018-06-30 DIAGNOSIS — N183 Chronic kidney disease, stage 3 (moderate): Secondary | ICD-10-CM | POA: Diagnosis present

## 2018-06-30 DIAGNOSIS — R0600 Dyspnea, unspecified: Secondary | ICD-10-CM

## 2018-06-30 DIAGNOSIS — G4700 Insomnia, unspecified: Secondary | ICD-10-CM | POA: Diagnosis not present

## 2018-06-30 DIAGNOSIS — F0390 Unspecified dementia without behavioral disturbance: Secondary | ICD-10-CM | POA: Diagnosis present

## 2018-06-30 DIAGNOSIS — J849 Interstitial pulmonary disease, unspecified: Secondary | ICD-10-CM | POA: Diagnosis present

## 2018-06-30 DIAGNOSIS — Z79899 Other long term (current) drug therapy: Secondary | ICD-10-CM

## 2018-06-30 DIAGNOSIS — Z90710 Acquired absence of both cervix and uterus: Secondary | ICD-10-CM

## 2018-06-30 DIAGNOSIS — N390 Urinary tract infection, site not specified: Secondary | ICD-10-CM | POA: Diagnosis not present

## 2018-06-30 DIAGNOSIS — R000 Tachycardia, unspecified: Secondary | ICD-10-CM

## 2018-06-30 DIAGNOSIS — Z515 Encounter for palliative care: Secondary | ICD-10-CM | POA: Diagnosis not present

## 2018-06-30 DIAGNOSIS — E039 Hypothyroidism, unspecified: Secondary | ICD-10-CM | POA: Diagnosis present

## 2018-06-30 DIAGNOSIS — E872 Acidosis: Secondary | ICD-10-CM | POA: Diagnosis present

## 2018-06-30 DIAGNOSIS — J9601 Acute respiratory failure with hypoxia: Secondary | ICD-10-CM | POA: Diagnosis present

## 2018-06-30 DIAGNOSIS — I4891 Unspecified atrial fibrillation: Secondary | ICD-10-CM | POA: Diagnosis present

## 2018-06-30 DIAGNOSIS — K5790 Diverticulosis of intestine, part unspecified, without perforation or abscess without bleeding: Secondary | ICD-10-CM | POA: Diagnosis present

## 2018-06-30 DIAGNOSIS — R41841 Cognitive communication deficit: Secondary | ICD-10-CM | POA: Diagnosis not present

## 2018-06-30 DIAGNOSIS — M1710 Unilateral primary osteoarthritis, unspecified knee: Secondary | ICD-10-CM | POA: Diagnosis present

## 2018-06-30 DIAGNOSIS — Z8542 Personal history of malignant neoplasm of other parts of uterus: Secondary | ICD-10-CM

## 2018-06-30 DIAGNOSIS — I5031 Acute diastolic (congestive) heart failure: Secondary | ICD-10-CM | POA: Diagnosis not present

## 2018-06-30 DIAGNOSIS — I2510 Atherosclerotic heart disease of native coronary artery without angina pectoris: Secondary | ICD-10-CM | POA: Diagnosis present

## 2018-06-30 DIAGNOSIS — R6510 Systemic inflammatory response syndrome (SIRS) of non-infectious origin without acute organ dysfunction: Secondary | ICD-10-CM | POA: Diagnosis present

## 2018-06-30 DIAGNOSIS — Z7952 Long term (current) use of systemic steroids: Secondary | ICD-10-CM

## 2018-06-30 DIAGNOSIS — M2550 Pain in unspecified joint: Secondary | ICD-10-CM | POA: Diagnosis not present

## 2018-06-30 DIAGNOSIS — Z8249 Family history of ischemic heart disease and other diseases of the circulatory system: Secondary | ICD-10-CM

## 2018-06-30 DIAGNOSIS — Z87891 Personal history of nicotine dependence: Secondary | ICD-10-CM

## 2018-06-30 DIAGNOSIS — Z993 Dependence on wheelchair: Secondary | ICD-10-CM

## 2018-06-30 DIAGNOSIS — R278 Other lack of coordination: Secondary | ICD-10-CM | POA: Diagnosis not present

## 2018-06-30 DIAGNOSIS — Z119 Encounter for screening for infectious and parasitic diseases, unspecified: Secondary | ICD-10-CM | POA: Diagnosis not present

## 2018-06-30 DIAGNOSIS — M6281 Muscle weakness (generalized): Secondary | ICD-10-CM | POA: Diagnosis not present

## 2018-06-30 DIAGNOSIS — E876 Hypokalemia: Secondary | ICD-10-CM | POA: Diagnosis not present

## 2018-06-30 DIAGNOSIS — E440 Moderate protein-calorie malnutrition: Secondary | ICD-10-CM | POA: Diagnosis not present

## 2018-06-30 DIAGNOSIS — Z882 Allergy status to sulfonamides status: Secondary | ICD-10-CM | POA: Diagnosis not present

## 2018-06-30 DIAGNOSIS — Z7189 Other specified counseling: Secondary | ICD-10-CM | POA: Diagnosis not present

## 2018-06-30 DIAGNOSIS — I10 Essential (primary) hypertension: Secondary | ICD-10-CM | POA: Diagnosis not present

## 2018-06-30 DIAGNOSIS — D72829 Elevated white blood cell count, unspecified: Secondary | ICD-10-CM | POA: Diagnosis present

## 2018-06-30 DIAGNOSIS — Z933 Colostomy status: Secondary | ICD-10-CM

## 2018-06-30 DIAGNOSIS — J309 Allergic rhinitis, unspecified: Secondary | ICD-10-CM | POA: Diagnosis not present

## 2018-06-30 DIAGNOSIS — I130 Hypertensive heart and chronic kidney disease with heart failure and stage 1 through stage 4 chronic kidney disease, or unspecified chronic kidney disease: Secondary | ICD-10-CM | POA: Diagnosis not present

## 2018-06-30 DIAGNOSIS — E875 Hyperkalemia: Secondary | ICD-10-CM

## 2018-06-30 DIAGNOSIS — R05 Cough: Secondary | ICD-10-CM | POA: Diagnosis not present

## 2018-06-30 DIAGNOSIS — M1990 Unspecified osteoarthritis, unspecified site: Secondary | ICD-10-CM | POA: Diagnosis present

## 2018-06-30 DIAGNOSIS — M810 Age-related osteoporosis without current pathological fracture: Secondary | ICD-10-CM | POA: Diagnosis present

## 2018-06-30 DIAGNOSIS — J449 Chronic obstructive pulmonary disease, unspecified: Secondary | ICD-10-CM | POA: Diagnosis not present

## 2018-06-30 DIAGNOSIS — R52 Pain, unspecified: Secondary | ICD-10-CM | POA: Diagnosis not present

## 2018-06-30 DIAGNOSIS — Z66 Do not resuscitate: Secondary | ICD-10-CM | POA: Diagnosis present

## 2018-06-30 DIAGNOSIS — Z79890 Hormone replacement therapy (postmenopausal): Secondary | ICD-10-CM

## 2018-06-30 DIAGNOSIS — I248 Other forms of acute ischemic heart disease: Secondary | ICD-10-CM | POA: Diagnosis present

## 2018-06-30 DIAGNOSIS — H04129 Dry eye syndrome of unspecified lacrimal gland: Secondary | ICD-10-CM | POA: Diagnosis not present

## 2018-06-30 DIAGNOSIS — H109 Unspecified conjunctivitis: Secondary | ICD-10-CM | POA: Diagnosis present

## 2018-06-30 DIAGNOSIS — Z79891 Long term (current) use of opiate analgesic: Secondary | ICD-10-CM

## 2018-06-30 DIAGNOSIS — E569 Vitamin deficiency, unspecified: Secondary | ICD-10-CM | POA: Diagnosis not present

## 2018-06-30 DIAGNOSIS — R0902 Hypoxemia: Secondary | ICD-10-CM | POA: Diagnosis not present

## 2018-06-30 DIAGNOSIS — R062 Wheezing: Secondary | ICD-10-CM | POA: Diagnosis not present

## 2018-06-30 DIAGNOSIS — Z880 Allergy status to penicillin: Secondary | ICD-10-CM

## 2018-06-30 DIAGNOSIS — A78 Q fever: Secondary | ICD-10-CM | POA: Diagnosis not present

## 2018-06-30 DIAGNOSIS — Z7401 Bed confinement status: Secondary | ICD-10-CM | POA: Diagnosis not present

## 2018-06-30 DIAGNOSIS — Z803 Family history of malignant neoplasm of breast: Secondary | ICD-10-CM

## 2018-06-30 DIAGNOSIS — R0602 Shortness of breath: Secondary | ICD-10-CM | POA: Diagnosis not present

## 2018-06-30 DIAGNOSIS — R070 Pain in throat: Secondary | ICD-10-CM | POA: Diagnosis not present

## 2018-06-30 DIAGNOSIS — L309 Dermatitis, unspecified: Secondary | ICD-10-CM | POA: Diagnosis not present

## 2018-06-30 LAB — BRAIN NATRIURETIC PEPTIDE: B Natriuretic Peptide: 80.4 pg/mL (ref 0.0–100.0)

## 2018-06-30 LAB — CREATININE, SERUM
Creatinine, Ser: 1.93 mg/dL — ABNORMAL HIGH (ref 0.44–1.00)
GFR calc Af Amer: 25 mL/min — ABNORMAL LOW (ref >60)
GFR, EST NON AFRICAN AMERICAN: 21 mL/min — AB (ref >60)

## 2018-06-30 LAB — URINALYSIS, ROUTINE W REFLEX MICROSCOPIC
Bilirubin Urine: NEGATIVE
Glucose, UA: NEGATIVE mg/dL
Hgb urine dipstick: NEGATIVE
Ketones, ur: NEGATIVE mg/dL
Leukocytes, UA: NEGATIVE
Nitrite: NEGATIVE
Protein, ur: 30 mg/dL — AB
Specific Gravity, Urine: 1.015 (ref 1.005–1.030)
pH: 5 (ref 5.0–8.0)

## 2018-06-30 LAB — CBC WITH DIFFERENTIAL/PLATELET
Band Neutrophils: 2 %
Basophils Absolute: 0 10*3/uL (ref 0.0–0.1)
Basophils Relative: 0 %
Eosinophils Absolute: 0 10*3/uL (ref 0.0–0.7)
Eosinophils Relative: 0 %
HCT: 37 % (ref 36.0–46.0)
Hemoglobin: 11.6 g/dL — ABNORMAL LOW (ref 12.0–15.0)
Lymphocytes Relative: 2 %
Lymphs Abs: 0.4 10*3/uL — ABNORMAL LOW (ref 0.7–4.0)
MCH: 28.6 pg (ref 26.0–34.0)
MCHC: 31.4 g/dL (ref 30.0–36.0)
MCV: 91.1 fL (ref 78.0–100.0)
Monocytes Absolute: 1.4 10*3/uL — ABNORMAL HIGH (ref 0.1–1.0)
Monocytes Relative: 7 %
Neutro Abs: 17.5 10*3/uL — ABNORMAL HIGH (ref 1.7–7.7)
Neutrophils Relative %: 89 %
Platelets: 243 10*3/uL (ref 150–400)
RBC: 4.06 MIL/uL (ref 3.87–5.11)
RDW: 15.6 % — ABNORMAL HIGH (ref 11.5–15.5)
WBC: 19.3 10*3/uL — ABNORMAL HIGH (ref 4.0–10.5)

## 2018-06-30 LAB — HEPATIC FUNCTION PANEL
ALT: 14 U/L (ref 0–44)
ALT: 13 U/L (ref 0–44)
AST: 25 U/L (ref 15–41)
AST: 18 U/L (ref 15–41)
Albumin: 2.9 g/dL — ABNORMAL LOW (ref 3.5–5.0)
Albumin: 2.3 g/dL — ABNORMAL LOW (ref 3.5–5.0)
Alkaline Phosphatase: 119 U/L (ref 38–126)
Alkaline Phosphatase: 87 U/L (ref 38–126)
BILIRUBIN DIRECT: 0.3 mg/dL — AB (ref 0.0–0.2)
BILIRUBIN TOTAL: 0.7 mg/dL (ref 0.3–1.2)
Bilirubin, Direct: 0.3 mg/dL — ABNORMAL HIGH (ref 0.0–0.2)
Indirect Bilirubin: 0.8 mg/dL (ref 0.3–0.9)
Indirect Bilirubin: 0.4 mg/dL (ref 0.3–0.9)
TOTAL PROTEIN: 7.5 g/dL (ref 6.5–8.1)
Total Bilirubin: 1.1 mg/dL (ref 0.3–1.2)
Total Protein: 5.5 g/dL — ABNORMAL LOW (ref 6.5–8.1)

## 2018-06-30 LAB — BASIC METABOLIC PANEL
Anion gap: 17 — ABNORMAL HIGH (ref 5–15)
BUN: 34 mg/dL — ABNORMAL HIGH (ref 8–23)
CO2: 19 mmol/L — ABNORMAL LOW (ref 22–32)
Calcium: 8.6 mg/dL — ABNORMAL LOW (ref 8.9–10.3)
Chloride: 102 mmol/L (ref 98–111)
Creatinine, Ser: 1.96 mg/dL — ABNORMAL HIGH (ref 0.44–1.00)
GFR calc Af Amer: 24 mL/min — ABNORMAL LOW (ref >60)
GFR calc non Af Amer: 21 mL/min — ABNORMAL LOW (ref >60)
Glucose, Bld: 143 mg/dL — ABNORMAL HIGH (ref 70–99)
Potassium: 4.1 mmol/L (ref 3.5–5.1)
Sodium: 138 mmol/L (ref 135–145)

## 2018-06-30 LAB — CBC
HEMATOCRIT: 29.6 % — AB (ref 36.0–46.0)
Hemoglobin: 9.3 g/dL — ABNORMAL LOW (ref 12.0–15.0)
MCH: 28.9 pg (ref 26.0–34.0)
MCHC: 31.4 g/dL (ref 30.0–36.0)
MCV: 91.9 fL (ref 78.0–100.0)
PLATELETS: 204 10*3/uL (ref 150–400)
RBC: 3.22 MIL/uL — ABNORMAL LOW (ref 3.87–5.11)
RDW: 15.5 % (ref 11.5–15.5)
WBC: 16.3 10*3/uL — AB (ref 4.0–10.5)

## 2018-06-30 LAB — LACTIC ACID, PLASMA
Lactic Acid, Venous: 3.2 mmol/L (ref 0.5–1.9)
Lactic Acid, Venous: 1.3 mmol/L (ref 0.5–1.9)

## 2018-06-30 LAB — TROPONIN I
TROPONIN I: 0.14 ng/mL — AB (ref <0.03)
Troponin I: 0.54 ng/mL (ref <0.03)

## 2018-06-30 LAB — MAGNESIUM: Magnesium: 2 mg/dL (ref 1.7–2.4)

## 2018-06-30 MED ORDER — FENTANYL CITRATE (PF) 100 MCG/2ML IJ SOLN
INTRAMUSCULAR | Status: AC | PRN
  Administered 2018-06-30: 50 ug via INTRAVENOUS

## 2018-06-30 MED ORDER — SODIUM CHLORIDE 0.9 % IV SOLN
INTRAVENOUS | Status: AC
  Administered 2018-06-30: 18:00:00 via INTRAVENOUS

## 2018-06-30 MED ORDER — FENTANYL CITRATE (PF) 100 MCG/2ML IJ SOLN
50.0000 ug | Freq: Once | INTRAMUSCULAR | Status: DC
  Filled 2018-06-30: qty 2

## 2018-06-30 MED ORDER — PROPOFOL 10 MG/ML IV BOLUS
0.5000 mg/kg | Freq: Once | INTRAVENOUS | Status: DC
  Filled 2018-06-30: qty 20

## 2018-06-30 MED ORDER — ALBUTEROL SULFATE HFA 108 (90 BASE) MCG/ACT IN AERS: 2.0000 | INHALATION_SPRAY | Freq: Four times a day (QID) | RESPIRATORY_TRACT | Status: DC | PRN

## 2018-06-30 MED ORDER — PROPOFOL 10 MG/ML IV BOLUS
INTRAVENOUS | Status: AC | PRN
  Administered 2018-06-30: 30 mg via INTRAVENOUS

## 2018-06-30 MED ORDER — BENZONATATE 100 MG PO CAPS
100.0000 mg | ORAL_CAPSULE | Freq: Three times a day (TID) | ORAL | Status: DC | PRN
  Administered 2018-06-30 – 2018-07-07 (×5): 100 mg via ORAL
  Filled 2018-06-30 (×5): qty 1

## 2018-06-30 MED ORDER — SODIUM CHLORIDE 0.9 % IV SOLN
INTRAVENOUS | Status: DC
  Administered 2018-06-30: 14:00:00 via INTRAVENOUS

## 2018-06-30 MED ORDER — TRAZODONE HCL 50 MG PO TABS
50.0000 mg | ORAL_TABLET | Freq: Every day | ORAL | Status: DC
  Administered 2018-06-30 – 2018-07-07 (×8): 50 mg via ORAL
  Filled 2018-06-30 (×8): qty 1

## 2018-06-30 MED ORDER — LEVOTHYROXINE SODIUM 75 MCG PO TABS
75.0000 ug | ORAL_TABLET | Freq: Every day | ORAL | Status: DC
  Administered 2018-07-01 – 2018-07-08 (×8): 75 ug via ORAL
  Filled 2018-06-30 (×8): qty 1

## 2018-06-30 MED ORDER — ONDANSETRON HCL 4 MG PO TABS: 4.0000 mg | ORAL_TABLET | Freq: Four times a day (QID) | ORAL | Status: DC | PRN

## 2018-06-30 MED ORDER — LORATADINE 10 MG PO TABS
10.0000 mg | ORAL_TABLET | Freq: Every day | ORAL | Status: DC
  Administered 2018-07-01 – 2018-07-08 (×8): 10 mg via ORAL
  Filled 2018-06-30 (×8): qty 1

## 2018-06-30 MED ORDER — POLYETHYLENE GLYCOL 3350 17 G PO PACK: 17.0000 g | PACK | Freq: Every day | ORAL | Status: DC | PRN

## 2018-06-30 MED ORDER — ONDANSETRON HCL 4 MG/2ML IJ SOLN: 4.0000 mg | Freq: Four times a day (QID) | INTRAMUSCULAR | Status: DC | PRN

## 2018-06-30 MED ORDER — MELATONIN 3 MG PO TABS
3.0000 mg | ORAL_TABLET | Freq: Every day | ORAL | Status: DC
  Administered 2018-06-30 – 2018-07-07 (×8): 3 mg via ORAL
  Filled 2018-06-30 (×8): qty 1

## 2018-06-30 MED ORDER — ACETAMINOPHEN 325 MG PO TABS
650.0000 mg | ORAL_TABLET | Freq: Four times a day (QID) | ORAL | Status: DC | PRN
  Administered 2018-07-01 – 2018-07-06 (×6): 650 mg via ORAL
  Filled 2018-06-30 (×6): qty 2

## 2018-06-30 MED ORDER — ASPIRIN 81 MG PO CHEW
324.0000 mg | CHEWABLE_TABLET | Freq: Once | ORAL | Status: AC
  Administered 2018-06-30: 324 mg via ORAL
  Filled 2018-06-30: qty 4

## 2018-06-30 MED ORDER — GUAIFENESIN-DM 100-10 MG/5ML PO SYRP
5.0000 mL | ORAL_SOLUTION | ORAL | Status: DC | PRN
  Administered 2018-06-30 – 2018-07-02 (×4): 5 mL via ORAL
  Filled 2018-06-30 (×4): qty 5

## 2018-06-30 MED ORDER — HYDROCERIN EX CREA
1.0000 "application " | TOPICAL_CREAM | Freq: Every day | CUTANEOUS | Status: DC | PRN
  Filled 2018-06-30: qty 113

## 2018-06-30 MED ORDER — VANCOMYCIN HCL 10 G IV SOLR
1500.0000 mg | Freq: Once | INTRAVENOUS | Status: DC
  Filled 2018-06-30: qty 1500

## 2018-06-30 MED ORDER — FLUTICASONE PROPIONATE 50 MCG/ACT NA SUSP
2.0000 | Freq: Every day | NASAL | Status: DC
  Administered 2018-07-01 – 2018-07-08 (×8): 2 via NASAL
  Filled 2018-06-30: qty 16

## 2018-06-30 MED ORDER — POLYVINYL ALCOHOL 1.4 % OP SOLN
1.0000 [drp] | Freq: Four times a day (QID) | OPHTHALMIC | Status: DC | PRN
  Filled 2018-06-30: qty 15

## 2018-06-30 MED ORDER — ACETAMINOPHEN 650 MG RE SUPP: 650.0000 mg | Freq: Four times a day (QID) | RECTAL | Status: DC | PRN

## 2018-06-30 MED ORDER — TRAMADOL HCL 50 MG PO TABS
50.0000 mg | ORAL_TABLET | Freq: Two times a day (BID) | ORAL | Status: DC | PRN
  Administered 2018-06-30 – 2018-07-01 (×2): 50 mg via ORAL
  Filled 2018-06-30 (×2): qty 1

## 2018-06-30 MED ORDER — ALBUTEROL SULFATE (2.5 MG/3ML) 0.083% IN NEBU: 2.5000 mg | INHALATION_SOLUTION | Freq: Three times a day (TID) | RESPIRATORY_TRACT | Status: DC | PRN

## 2018-06-30 MED ORDER — SODIUM CHLORIDE 0.9 % IV SOLN
1.0000 g | Freq: Once | INTRAVENOUS | Status: AC
  Administered 2018-06-30: 1 g via INTRAVENOUS
  Filled 2018-06-30: qty 1

## 2018-06-30 MED ORDER — ACETAMINOPHEN 325 MG PO TABS
325.0000 mg | ORAL_TABLET | Freq: Once | ORAL | Status: AC
  Administered 2018-06-30: 325 mg via ORAL
  Filled 2018-06-30: qty 1

## 2018-06-30 MED ORDER — ENOXAPARIN SODIUM 30 MG/0.3ML ~~LOC~~ SOLN
30.0000 mg | SUBCUTANEOUS | Status: DC
  Administered 2018-06-30: 30 mg via SUBCUTANEOUS
  Filled 2018-06-30: qty 0.3

## 2018-06-30 NOTE — Sedation Documentation (Signed)
X-ray at bedside

## 2018-06-30 NOTE — H&P (Addendum)
History and Physical    [name] [name] NLZ:[id number] DOB: [birth year] DOA: 06/30/2018  PCP: Lajean Manes, MD  Patient coming from: [facility]  Chief Complaint: fatigue, weak  HPI: [name] [name] is a 84 y.o. female with medical history significant of Hypothyroidism, osteoporosis, HTN, Dementia, h/o endometrial cancer, CAD, arthritis,anemia, diverticulosis with colostomy in place who presents with not feeling well for 1 week.  She notes that she began to feel weak several days ago.  She also developed coughing.  Due to this she has had decreased interest in food and has not been eating well.  The only thing she wants is cold liquid.  She has had no sputum production. She has associated eye redness and drainage bilaterally.  She denies feer at home, chest pain palpitations, nausea, vomiting, abdominal pain, change in bowel or bladder habits, skin changes or rash.  She does report "feeling achy like I have mono."    ED Course: She presented to the ED in Afib with RVR.  She was hypoxic and SOB.  She was having rates in the 180s - 200s and was cardioverted.  She is now in sinus rhythm, but remains mildly tachycardic.  Labs show a CO2 of 19, AG of 17, BUN of 34, Cr of 1.96 (baseline around 1.5), TnI of 0.54, WBC of 19 (normal in June), UA was negative for any acute infection.  CXR showed pulmonary edema, but unchanged from previous.  She denies any skin pathology.  EKG after cardioversion showed sinus tachycardia.   Review of Systems: As per HPI otherwise 10 point review of systems negative.   Past Medical History:  Diagnosis Date  . Anemia   . Arthritis   . CAD (coronary artery disease)   . Cancer [facility])    Endometrial  . Congenital disease    Left forearm  . Dementia   . Diverticulosis   . History of degenerative disc disease    Both knees  . Hypertension   . Osteoporosis    osteopenia  . Thyroid disease    Hypothyroidism    Past Surgical History:  Procedure Laterality Date  . ABDOMINAL  HYSTERECTOMY    . BOWEL RESECTION    . COLOSTOMY  [id number]  . COLOSTOMY    . OTHER SURGICAL HISTORY    . ROTATOR CUFF REPAIR  1990s     reports that she has quit smoking. She has never used smokeless tobacco. She reports that she does not drink alcohol or use drugs.  Allergies  Allergen Reactions  . Sulfa Antibiotics Hives  . Penicillins Itching and Rash    Tolerates ceftriaxone and cefdinir  Has patient had a PCN reaction causing immediate rash, facial/tongue/throat swelling, SOB or lightheadedness with hypotension: Yes Has patient had a PCN reaction causing severe rash involving mucus membranes or skin necrosis: Unk Has patient had a PCN reaction that required hospitalization: Unk Has patient had a PCN reaction occurring within the last 10 years: Unk If all of the above answers are "NO", then may proceed with Cephalosporin use.    Reviewed Family History  Problem Relation Age of Onset  . Breast cancer Sister   . Heart disease Mother   . Heart attack Father    Reviewed MAR from ALF Prior to Admission medications   Medication Sig Start Date End Date Taking? Authorizing Provider  acetaminophen (TYLENOL) 325 MG tablet Take 650 mg by mouth See admin instructions. Take 650 mg by mouth once a day at 2 PM   Yes  [provider]  acetaminophen (TYLENOL) 500 MG tablet Take 500 mg by mouth every 6 (six) hours as needed for mild pain, moderate pain, fever or headache.   Yes [provider]  albuterol (PROVENTIL HFA;VENTOLIN HFA) 108 (90 Base) MCG/ACT inhaler Inhale 2 puffs into the lungs every 6 (six) hours as needed for wheezing or shortness of breath. 10/17/16  Yes Eber Jones, MD  albuterol (PROVENTIL) (2.5 MG/3ML) 0.083% nebulizer solution Take 2.5 mg by nebulization every 8 (eight) hours as needed for wheezing or shortness of breath.   Yes [provider]  benzonatate (TESSALON) 100 MG capsule Take 100 mg by mouth 3 (three) times daily as needed for  cough.   Yes [provider]  bumetanide (BUMEX) 1 MG tablet Take 2 mg by mouth daily.    Yes [provider]  fluticasone (FLONASE) 50 MCG/ACT nasal spray Place 2 sprays into both nostrils daily.   Yes [provider]  guaiFENesin-dextromethorphan (ROBITUSSIN DM) 100-10 MG/5ML syrup Take 5 mLs by mouth every 4 (four) hours as needed for cough. 10/17/16  Yes Eber Jones, MD  levothyroxine (SYNTHROID, LEVOTHROID) 75 MCG tablet Take 75 mcg by mouth daily before breakfast.   Yes [provider]  loratadine (CLARITIN) 10 MG tablet Take 10 mg by mouth daily.   Yes [provider]  Melatonin 5 MG TABS Take 1 tablet by mouth at bedtime.   Yes [provider]  metoprolol tartrate (LOPRESSOR) 25 MG tablet Take 0.5 tablets (12.5 mg total) by mouth 2 (two) times daily. 03/07/18  Yes Patrecia Pour, MD  Polyethyl Glyc-Propyl Glyc PF (SYSTANE ULTRA PF) 0.4-0.3 % SOLN Place 1 drop into both eyes See admin instructions. Instill 1 drop into both eyes four times a day and 1 drop into both eyes four times a day as needed for dryness/irritation   Yes [provider]  potassium chloride (KLOR-CON) 20 MEQ packet Take 20 mEq by mouth 2 (two) times daily.    Yes [provider]  Skin Protectants, Misc. (EUCERIN) cream Apply 1 application topically See admin instructions. Apply to left side of neck three times a day   Yes [provider]  traMADol (ULTRAM) 50 MG tablet Take 1 tablet (50 mg total) by mouth every 8 (eight) hours as needed (pain). Up to 3 times daily as needed  and at bedtime Patient taking differently: Take 50 mg by mouth every 6 (six) hours as needed (for pain).  03/07/18  Yes Patrecia Pour, MD  traZODone (DESYREL) 50 MG tablet Take 50 mg by mouth at bedtime. 02/26/18  Yes [provider]  ipratropium-albuterol (DUONEB) 0.5-2.5 (3) MG/3ML SOLN Take 3 mLs by nebulization 3 (three) times daily. 10/17/16   Eber Jones, MD  triamcinolone (KENALOG) 0.025 % cream Apply 1 application topically 2 (two) times daily as needed (for rash).    [provider]    Physical Exam:  Constitutional: Elderly woman, appears very fatigued Vitals:   06/30/18 1538 06/30/18 1620 06/30/18 1635 06/30/18 1645  BP:  (!) 108/40 110/64 (!) 106/39  Pulse:  (!) 108 (!) 109 (!) 102  Resp:  (!) 27 20 (!) 23  Temp: (!) 100.5 F (38.1 C)     TempSrc: Rectal     SpO2:  99% 97% 98%  Weight:      Height:       Eyes: PERRL, she has injection conjunctivae and dried crust on eyelashes bilaterally ENMT: Mucous membranes are  dry, [place] in place Neck: normal, supple Respiratory: Crackles at bases, otherwise clear, no wheezing Cardiovascular: Tachycardic, regular, no murmur, z pads in place.  Abdomen: + BS, NT, ND Musculoskeletal: no clubbing / cyanosis. Muscle tone appears normal Skin: no rashes, lesions, ulcers  Per nursing, no lesions on bottom, but there was some erythema at the buttock.  She has no vulvar lesions, Pure wick was in place, no foot wounds.  Neurologic: She is very fatigued, but moves all extremities and withdraws to pain Psychiatric:  Alert and oriented x 3. Normal mood.   Labs on Admission: I have personally reviewed following labs and imaging studies  CBC: Recent Labs  Lab 06/30/18 1354  WBC 19.3*  NEUTROABS 17.5*  HGB 11.6*  HCT 37.0  MCV 91.1  PLT 202   Basic Metabolic Panel: Recent Labs  Lab 06/30/18 1354  NA 138  K 4.1  CL 102  CO2 19*  GLUCOSE 143*  BUN 34*  CREATININE 1.96*  CALCIUM 8.6*  MG 2.0   GFR: Estimated Creatinine Clearance: 19.8 mL/min (A) (by C-G formula based on SCr of 1.96 mg/dL (H)). Liver Function Tests: No results for input(s): AST, ALT, ALKPHOS, BILITOT, PROT, ALBUMIN in the last 168 hours. No results for input(s): LIPASE, AMYLASE in the last 168 hours. No results for input(s): AMMONIA in the last 168 hours. Coagulation Profile: No results for  input(s): INR, PROTIME in the last 168 hours. Cardiac Enzymes: Recent Labs  Lab 06/30/18 1354  TROPONINI 0.54*   BNP (last 3 results) No results for input(s): PROBNP in the last 8760 hours. HbA1C: No results for input(s): HGBA1C in the last 72 hours. CBG: No results for input(s): GLUCAP in the last 168 hours. Lipid Profile: No results for input(s): CHOL, HDL, LDLCALC, TRIG, CHOLHDL, LDLDIRECT in the last 72 hours. Thyroid Function Tests: No results for input(s): TSH, T4TOTAL, FREET4, T3FREE, THYROIDAB in the last 72 hours. Anemia Panel: No results for input(s): VITAMINB12, FOLATE, FERRITIN, TIBC, IRON, RETICCTPCT in the last 72 hours. Urine analysis:    Component Value Date/Time   COLORURINE YELLOW 06/30/2018 1538   APPEARANCEUR CLOUDY (A) 06/30/2018 1538   LABSPEC 1.015 06/30/2018 1538   PHURINE 5.0 06/30/2018 1538   GLUCOSEU NEGATIVE 06/30/2018 1538   HGBUR NEGATIVE 06/30/2018 1538   BILIRUBINUR NEGATIVE 06/30/2018 1538   KETONESUR NEGATIVE 06/30/2018 1538   PROTEINUR 30 (A) 06/30/2018 1538   NITRITE NEGATIVE 06/30/2018 1538   LEUKOCYTESUR NEGATIVE 06/30/2018 1538    Radiological Exams on Admission: Dg Chest Portable 1 View  Result Date: 06/30/2018 CLINICAL DATA:  Short of breath. EXAM: PORTABLE CHEST 1 VIEW COMPARISON:  03/05/2018 FINDINGS: External pad overlies mediastinum. Stable cardiac silhouette. There are low lung volumes. Bilateral small effusions. There is central venous pulmonary congestion and mild interstitial edema pattern. No change from prior. No pneumothorax. Atherosclerotic calcification of the aorta. IMPRESSION: Effusions and interstitial edema pattern.  No significant change. Electronically Signed   By: Suzy Bouchard M.D.   On: 06/30/2018 14:24    EKG: Independently reviewed. Atrial tachycardia after cardioversion  Assessment/Plan  SIRS - meets these criteria given fever, tachycardia, WBC.  Unclear cause, CXR okay, UA not revealing.  With her  conjunctivitis and myalgias, consider viral causes, possibly influenza.  Afib with RVR is likely in response to acute inflammatory process.  - Abx started in ED - Cefepime and vancomycin.  I discontinued vancomycin as she has no skin findings on my exam.   - BC X 2 ordered - Respiratory  virus panel, droplet precautions - IVF with NS at 100cc/hr, hold diuretics - Monitor tachycardia, consider bolus if needed, but she has pulmonary edema at baseline - Telemetry - Oxygen by nasal cannula  Anion gap metabolic acidosis - Lactic acid not checked in ED - Glucose was normal - Trend, possibly related to an acute infection.  Will not draw at this time given patient is improving clinically and is on treatment.     Afib, now with Sinus tachycardia - She is on a beta blocker at home, this will be held given acute process above.  Restart when appropriate - IVF with NS at 100cc/hr - Monitor on telemetry - Trend Troponin, AM EKG  AKI on CKD - IVF as noted - Trend Cr  Chronic diastolic CHF - Holding bumex, careful fluid use as she might get more SOB if she develops edema - Low threshold to give dose of lasix if worsening.     HTN (hypertension) - BP is on the low side right now, hold bumex, metoprolol overnight until she begins to improve  Colostomy in place - Hard stool in colostomy bag, no report of nausea, vomiting or diarrhea - Colostomy care will be ordered.    DVT prophylaxis: Lovenox Code Status: DNR sheet brought from ALF Disposition Plan: Admit and monitor for improvement.   Consults called: None Admission status: SDU, telemetry   Gilles Chiquito MD [facility] Pager [phone]  If 7PM-7AM, please contact night-coverage [email or web address] Password [facility]  06/30/2018, 5:04 PM

## 2018-06-30 NOTE — ED Notes (Signed)
Attempted to call report

## 2018-06-30 NOTE — Sedation Documentation (Signed)
Pt cardioverted at 120j 

## 2018-06-30 NOTE — ED Notes (Signed)
Admitting, MD at bedside.  

## 2018-06-30 NOTE — ED Notes (Signed)
ED Provider at bedside. Phlebotomy at bedside.

## 2018-06-30 NOTE — ED Triage Notes (Signed)
Per [facility], pt arrives from [facility] at [place]. Pt c/o SOB  x1 day. Pt received neb from staff at 1000 with short term relief. Pt received duoneb en route from EMS. Pt denies hx of asthma, COPD, or CHF. Pt found to be afib on monitor with EMS with rate ranging from 90--170. Pt denies hx of a fib.

## 2018-06-30 NOTE — Plan of Care (Signed)
Care plan initiated and progressing 

## 2018-06-30 NOTE — ED Notes (Signed)
EDP at bedside, pt on pads, respiratory at bedside.

## 2018-06-30 NOTE — Progress Notes (Signed)
Pt received from ED. VSS. Telemetry applied. CHG complete. Pt and family oriented to room and unit. Will continue to monitor. Clyde Canterbury, RN

## 2018-06-30 NOTE — ED Notes (Signed)
Lab to add on hepatic function panel 

## 2018-06-30 NOTE — ED Provider Notes (Signed)
[facility] EMERGENCY DEPARTMENT Provider Note   CSN: [id number] Arrival date & time: 06/30/18  1325     History   Chief Complaint Chief Complaint  Patient presents with  . Shortness of Breath  . Atrial Fibrillation    HPI [name] [name] is a 84 y.o. female.  HPI   84 y.o. female with medical history significant of Hypothyroidism, osteoporosis, HTN, Dementia, h/o endometrial cancer, CAD, arthritis,anemia, diverticulosis with colostomy in place who presents with not feeling well for 1 week.  She notes that she began to feel weak several days ago.  She also developed coughing.  Due to this she has had decreased interest in food and has not been eating well.  Weakness has been progressive. Presented in afib with RVR. She denies palpitations. She denies pain aside from feeling achy all over. Feels SOB.   Past Medical History:  Diagnosis Date  . Anemia   . Arthritis   . CAD (coronary artery disease)   . Cancer [facility])    Endometrial  . Congenital disease    Left forearm  . Dementia   . Diverticulosis   . History of degenerative disc disease    Both knees  . Hypertension   . Osteoporosis    osteopenia  . Thyroid disease    Hypothyroidism    Patient Active Problem List   Diagnosis Date Noted  . Heart failure, diastolic, acute on chronic ([place]) 03/05/2018  . Acute congestive heart failure ([place])   . Sepsis ([place]) 10/12/2016  . Sinus tachycardia 10/12/2016  . HTN (hypertension) 10/12/2016  . COPD exacerbation ([place])   . CAP (community acquired pneumonia) 10/11/2016  . Colostomy in place [facility]) 12/10/2013    Past Surgical History:  Procedure Laterality Date  . ABDOMINAL HYSTERECTOMY    . BOWEL RESECTION    . COLOSTOMY  [id number]  . COLOSTOMY    . OTHER SURGICAL HISTORY    . ROTATOR CUFF REPAIR  1990s     OB History   None      Home Medications    Prior to Admission medications   Medication Sig Start Date End Date Taking? Authorizing Provider    acetaminophen (TYLENOL) 325 MG tablet Take 650 mg by mouth daily. At 2 PM    [provider]  albuterol (PROVENTIL HFA;VENTOLIN HFA) 108 (90 Base) MCG/ACT inhaler Inhale 2 puffs into the lungs every 6 (six) hours as needed for wheezing or shortness of breath. 10/17/16   Eber Jones, MD  benzonatate (TESSALON) 100 MG capsule Take 100 mg by mouth 3 (three) times daily as needed for cough.    [provider]  bumetanide (BUMEX) 1 MG tablet Take 2 mg by mouth daily.     [provider]  fluticasone (FLONASE) 50 MCG/ACT nasal spray Place 2 sprays into both nostrils daily.    [provider]  guaiFENesin-dextromethorphan (ROBITUSSIN DM) 100-10 MG/5ML syrup Take 5 mLs by mouth every 4 (four) hours as needed for cough. 10/17/16   Eber Jones, MD  ipratropium-albuterol (DUONEB) 0.5-2.5 (3) MG/3ML SOLN Take 3 mLs by nebulization 3 (three) times daily. 10/17/16   Eber Jones, MD  levothyroxine (SYNTHROID, LEVOTHROID) 75 MCG tablet Take 75 mcg by mouth daily before breakfast.    [provider]  loratadine (CLARITIN) 10 MG tablet Take 10 mg by mouth daily.    [provider]  Melatonin 5 MG TABS Take 1 tablet by mouth at bedtime.    [provider]  metoprolol tartrate (LOPRESSOR) 25 MG tablet Take 0.5 tablets (12.5 mg total) by mouth 2 (two) times daily. 03/07/18   Patrecia Pour, MD  potassium chloride (KLOR-CON) 20 MEQ packet Take by mouth 2 (two) times daily.    [provider]  traMADol (ULTRAM) 50 MG tablet Take 1 tablet (50 mg total) by mouth every 8 (eight) hours as needed (pain). Up to 3 times daily as needed  and at bedtime 03/07/18   Patrecia Pour, MD  traZODone (DESYREL) 50 MG tablet Take 50 mg by mouth at bedtime. 02/26/18   [provider]  triamcinolone (KENALOG) 0.025 % cream Apply 1 application topically 2 (two) times daily as needed (for rash).    [provider]    Family  History Family History  Problem Relation Age of Onset  . Breast cancer Sister   . Heart disease Mother   . Heart attack Father     Social History Social History   Tobacco Use  . Smoking status: Former Research scientist (life sciences)  . Smokeless tobacco: Never Used  Substance Use Topics  . Alcohol use: No  . Drug use: No     Allergies   Sulfa antibiotics and Penicillins   Review of Systems Review of Systems  All systems reviewed and negative, other than as noted in HPI.  Physical Exam Updated Vital Signs BP (!) 145/103 (BP Location: Right Arm)   Pulse (!) 184   Temp 99.2 F (37.3 C) (Oral)   Resp (!) 28   SpO2 90%   Physical Exam  Constitutional: She appears well-developed. She appears distressed.  Drowsy. Appears uncomfortable. Moaning.   HENT:  Head: Normocephalic and atraumatic.  Eyes: Conjunctivae are normal. Right eye exhibits no discharge. Left eye exhibits no discharge.  Neck: Neck supple.  Cardiovascular: Normal rate, regular rhythm and normal heart sounds. Exam reveals no gallop and no friction rub.  No murmur heard. Pulmonary/Chest: Effort normal and breath sounds normal. No respiratory distress.  Abdominal: Soft. She exhibits no distension. There is no tenderness.  Ostomy with healthy appearing stoma. Bag empty.   Musculoskeletal: She exhibits edema. She exhibits no tenderness.  Prosthetic left lower arm  Neurological:  Drowsy.   Skin: Skin is warm and dry.  Psychiatric: She has a normal mood and affect. Her behavior is normal. Thought content normal.  Nursing note and vitals reviewed.    ED Treatments / Results  Labs (all labs ordered are listed, but only abnormal results are displayed) Labs Reviewed  CBC WITH DIFFERENTIAL/PLATELET - Abnormal; Notable for the following components:      Result Value   WBC 19.3 (*)    Hemoglobin 11.6 (*)    RDW 15.6 (*)    Neutro Abs 17.5 (*)    Lymphs Abs 0.4 (*)    Monocytes Absolute 1.4 (*)    All other components within  normal limits  BASIC METABOLIC PANEL - Abnormal; Notable for the following components:   CO2 19 (*)    Glucose, Bld 143 (*)    BUN 34 (*)    Creatinine, Ser 1.96 (*)    Calcium 8.6 (*)    GFR calc non Af Amer 21 (*)    GFR calc Af Amer 24 (*)    Anion gap 17 (*)    All other components within normal limits  TROPONIN I - Abnormal; Notable for the following components:   Troponin I 0.54 (*)    All other components within normal limits  URINALYSIS, ROUTINE W  REFLEX MICROSCOPIC - Abnormal; Notable for the following components:   APPearance CLOUDY (*)    Protein, ur 30 (*)    Bacteria, UA RARE (*)    All other components within normal limits  URINE CULTURE  CULTURE, BLOOD (ROUTINE X 2)  CULTURE, BLOOD (ROUTINE X 2)  MAGNESIUM  BRAIN NATRIURETIC PEPTIDE  LACTIC ACID, PLASMA  LACTIC ACID, PLASMA    EKG EKG Interpretation  Date/Time:  Saturday June 30 2018 13:59:11 EDT Ventricular Rate:  129 PR Interval:    QRS Duration: 77 QT Interval:  292 QTC Calculation: 428 R Axis:   1 Text Interpretation:  Sinus tachycardia Low voltage, extremity and precordial leads Abnormal R-wave progression, early transition ST depression, probably rate related Confirmed by Virgel Manifold [phone]) on 06/30/2018 2:05:23 PM   Radiology Dg Chest Portable 1 View  Result Date: 06/30/2018 CLINICAL DATA:  Short of breath. EXAM: PORTABLE CHEST 1 VIEW COMPARISON:  03/05/2018 FINDINGS: External pad overlies mediastinum. Stable cardiac silhouette. There are low lung volumes. Bilateral small effusions. There is central venous pulmonary congestion and mild interstitial edema pattern. No change from prior. No pneumothorax. Atherosclerotic calcification of the aorta. IMPRESSION: Effusions and interstitial edema pattern.  No significant change. Electronically Signed   By: Suzy Bouchard M.D.   On: 06/30/2018 14:24    Procedures .Sedation Date/Time: 06/30/2018 4:26 PM Performed by: Virgel Manifold,  MD Authorized by: Virgel Manifold, MD   Consent:    Consent obtained:  Verbal and emergent situation   Consent given by:  Patient   Risks discussed:  Allergic reaction, dysrhythmia, inadequate sedation, nausea, prolonged hypoxia resulting in organ damage, prolonged sedation necessitating reversal, respiratory compromise necessitating ventilatory assistance and intubation and vomiting   Alternatives discussed:  Analgesia without sedation, anxiolysis and regional anesthesia Universal protocol:    Procedure explained and questions answered to patient or proxy's satisfaction: yes     Relevant documents present and verified: yes     Test results available and properly labeled: yes     Imaging studies available: yes     Required blood products, implants, devices, and special equipment available: yes     Site/side marked: yes     Immediately prior to procedure a time out was called: yes     Patient identity confirmation method:  Verbally with patient, arm band and provided demographic data Indications:    Procedure performed:  Cardioversion   Procedure necessitating sedation performed by:  Physician performing sedation   Intended level of sedation:  Deep Pre-sedation assessment:    Time since last food or drink:  Unknown   ASA classification: class 3 - patient with severe systemic disease     Neck mobility: normal     Mouth opening:  2 finger widths   Thyromental distance:  3 finger widths   Mallampati score:  II - soft palate, uvula, fauces visible   Pre-sedation assessments completed and reviewed: airway patency, cardiovascular function, hydration status, mental status, nausea/vomiting, pain level, respiratory function and temperature     Pre-sedation assessment completed:  06/30/2018 1:30 PM Immediate pre-procedure details:    Reassessment: Patient reassessed immediately prior to procedure     Reviewed: vital signs, relevant labs/tests and NPO status     Verified: bag valve mask available,  emergency equipment available, intubation equipment available, IV patency confirmed, oxygen available and suction available   Procedure details (see MAR for exact dosages):    Preoxygenation:  Nonrebreather mask   Sedation:  Propofol   Analgesia:  Fentanyl  Intra-procedure monitoring:  Blood pressure monitoring, cardiac monitor, continuous pulse oximetry, frequent LOC assessments, frequent vital sign checks and continuous capnometry   Intra-procedure events: none     Total Provider sedation time (minutes):  35 Post-procedure details:    Post-sedation assessment completed:  06/30/2018 2:40 PM   Attendance: Constant attendance by certified staff until patient recovered     Recovery: Patient returned to pre-procedure baseline     Post-sedation assessments completed and reviewed: airway patency, cardiovascular function, hydration status, mental status, nausea/vomiting, pain level, respiratory function and temperature     Patient is stable for discharge or admission: yes     Patient tolerance:  Tolerated well, no immediate complications .Cardioversion Date/Time: 06/30/2018 4:30 PM Performed by: Virgel Manifold, MD Authorized by: Virgel Manifold, MD   Consent:    Consent obtained:  Emergent situation   Consent given by:  Patient   Risks discussed:  Cutaneous burn, induced arrhythmia and pain Pre-procedure details:    Cardioversion basis:  Emergent   Rhythm:  Atrial fibrillation Patient sedated: Yes. Refer to sedation procedure documentation for details of sedation.  Attempt one:    Cardioversion mode:  Synchronous   Shock (Joules):  120   Shock outcome:  Conversion to normal sinus rhythm Post-procedure details:    Patient status:  Awake    CRITICAL CARE Performed by: Virgel Manifold Total critical care time: 40 minutes Critical care time was exclusive of separately billable procedures and treating other patients. Critical care was necessary to treat or prevent imminent or  life-threatening deterioration. Critical care was time spent personally by me on the following activities: development of treatment plan with patient and/or surrogate as well as nursing, discussions with consultants, evaluation of patient's response to treatment, examination of patient, obtaining history from patient or surrogate, ordering and performing treatments and interventions, ordering and review of laboratory studies, ordering and review of radiographic studies, pulse oximetry and re-evaluation of patient's condition.     (including critical care time)   Medications Ordered in ED Medications  fentaNYL (SUBLIMAZE) injection 50 mcg (has no administration in time range)  propofol (DIPRIVAN) 10 mg/mL bolus/IV push 0.5 mg/kg (has no administration in time range)  0.9 %  sodium chloride infusion (has no administration in time range)     Initial Impression / Assessment and Plan / ED Course  I have reviewed the triage vital signs and the nursing notes.  Pertinent labs & imaging results that were available during my care of the patient were reviewed by me and considered in my medical decision making (see chart for details).     89yF with dyspnea. Presumably new onset afib. HR 160-200.  Moaning. Kept saying she didn't feel good. Coughing. o2 sat 88-91% on RA. Seemed very symptomaitc and concerned she was developing acute pulmonary edema. Emergently cardioverted.   She converted to sinus hythm but still elevated in the 120s to 130s.  Felt warm to touch.  Rectal temp 100.5.  Heart rate coming down further after Tylenol to 100-110.  Patient clinically looks much more comfortable and she states that she feels better.  Denies any pain.  She states that she just feels very tired.  Empiric abx ordered, but not sure of the source of her fever at this point.  Chest x-ray without any focal infiltrate. Looks like failure. BNP normal though.  She is coughing a little bit but her daughter is now at  bedside reports that this is chronic.  Abdominal exam is benign.  UA looks  okay.  Troponin is elevated but this may be form demand ischemia. She has listed history of CAD but I'm not sure of specifics beyond this. She looks/feels bet after conversion and denies pain. ASA given. Heparin deferred.    Final Clinical Impressions(s) / ED Diagnoses   Final diagnoses:  Atrial fibrillation with rapid ventricular response [facility])    ED Discharge Orders    None       Virgel Manifold, MD 07/10/18 [phone]

## 2018-07-01 ENCOUNTER — Encounter (HOSPITAL_COMMUNITY): Payer: Self-pay | Admitting: *Deleted

## 2018-07-01 DIAGNOSIS — N183 Chronic kidney disease, stage 3 (moderate): Secondary | ICD-10-CM

## 2018-07-01 DIAGNOSIS — N179 Acute kidney failure, unspecified: Secondary | ICD-10-CM

## 2018-07-01 DIAGNOSIS — I5031 Acute diastolic (congestive) heart failure: Secondary | ICD-10-CM

## 2018-07-01 LAB — BASIC METABOLIC PANEL
Anion gap: 8 (ref 5–15)
BUN: 29 mg/dL — AB (ref 8–23)
CHLORIDE: 107 mmol/L (ref 98–111)
CO2: 23 mmol/L (ref 22–32)
Calcium: 7.9 mg/dL — ABNORMAL LOW (ref 8.9–10.3)
Creatinine, Ser: 1.5 mg/dL — ABNORMAL HIGH (ref 0.44–1.00)
GFR calc Af Amer: 33 mL/min — ABNORMAL LOW (ref >60)
GFR, EST NON AFRICAN AMERICAN: 29 mL/min — AB (ref >60)
GLUCOSE: 92 mg/dL (ref 70–99)
POTASSIUM: 3.9 mmol/L (ref 3.5–5.1)
Sodium: 138 mmol/L (ref 135–145)

## 2018-07-01 LAB — URINE CULTURE: Culture: NO GROWTH

## 2018-07-01 LAB — TROPONIN I
Troponin I: 0.11 ng/mL (ref <0.03)
Troponin I: 0.08 ng/mL (ref <0.03)

## 2018-07-01 LAB — GLUCOSE, CAPILLARY: GLUCOSE-CAPILLARY: 95 mg/dL (ref 70–99)

## 2018-07-01 MED ORDER — ASPIRIN EC 81 MG PO TBEC
81.0000 mg | DELAYED_RELEASE_TABLET | Freq: Every day | ORAL | Status: DC
  Administered 2018-07-01 – 2018-07-02 (×2): 81 mg via ORAL
  Filled 2018-07-01 (×2): qty 1

## 2018-07-01 MED ORDER — APIXABAN 2.5 MG PO TABS
2.5000 mg | ORAL_TABLET | Freq: Two times a day (BID) | ORAL | Status: DC
  Administered 2018-07-01 – 2018-07-08 (×14): 2.5 mg via ORAL
  Filled 2018-07-01 (×14): qty 1

## 2018-07-01 MED ORDER — FUROSEMIDE 10 MG/ML IJ SOLN
60.0000 mg | Freq: Two times a day (BID) | INTRAMUSCULAR | Status: DC
  Administered 2018-07-01 – 2018-07-03 (×5): 60 mg via INTRAVENOUS
  Filled 2018-07-01 (×5): qty 6

## 2018-07-01 MED ORDER — METOPROLOL TARTRATE 25 MG PO TABS
25.0000 mg | ORAL_TABLET | Freq: Two times a day (BID) | ORAL | Status: DC
  Administered 2018-07-01 – 2018-07-08 (×15): 25 mg via ORAL
  Filled 2018-07-01 (×15): qty 1

## 2018-07-01 MED ORDER — TRAMADOL HCL 50 MG PO TABS
50.0000 mg | ORAL_TABLET | Freq: Four times a day (QID) | ORAL | Status: DC | PRN
  Administered 2018-07-03 – 2018-07-08 (×12): 50 mg via ORAL
  Filled 2018-07-01 (×12): qty 1

## 2018-07-01 NOTE — Progress Notes (Signed)
PROGRESS NOTE    [name] [name]  DJS:[id number] DOB: 1923/04/15 DOA: 06/30/2018 PCP: Lajean Manes, MD    Brief Narrative:  84 year old female who presented with fatigue and weakness.  She does have significant past medical history for hypothyroidism, osteoporosis, hypertension, dementia, coronary disease, diverticulosis status post colostomy and history of endometrial cancer.  Reported not feeling well for the last 7 days, with significant decreased oral intake.  On the initial physical examination her heart rate was 180-200 irregular, and underwent cardioversion in the ED. on admission, blood pressure is 108/40, heart rate 108, respiratory 27, temperature 100.5, oxygen saturation 97%.  Dry mucous membranes, lungs with bilateral rales, heart S1-S2 present, regular, abdomen soft nontender, no lower extremity edema.  Sodium 138, potassium 4.1, chloride 102, bicarb 19, glucose 143, BUN 34, creatinine 1.9, white count 19.3, hemoglobin 9.6, hematocrit 37.0, platelets 243, troponin I 0.54, urinalysis negative for infection, chest radiograph with increased lung markings, EKG had atrial fibrillation, heart rate 180 bpm, left axis, depressions.  Patient was admitted to the hospital with the working diagnosis of systemic inflammatory syndrome complicated by fibrillation with rapid ventricular response and acute kidney injury.    Assessment & Plan:   Active Problems:   SIRS (systemic inflammatory response syndrome) ([facility])   Sinus tachycardia   HTN (hypertension)   Atrial fibrillation with rapid ventricular response ([place])   1. Acute on chronic diastolic heart failure decompensation. Patient continue to be volume overloaded, positive raled, edema and JVD. Will increase diuretic therapy to furosemide 60 mg IV q12 hours, to target negative fluid balance. Continue telemetry monitoring. Troponin elevation likely due to heart failure exacerbation. Check echocardiogram.   2. Atrial fibrillation with RVR. Patient sp  cardioversion, now on sinus rhythm will continue telemetry monitoring and will start patient on metoprolol 25 mg po bid for rate control. Will need anticoagulation due to age, heart failure, htn and gender, will start patient on epixaban renal dosing.   3. AKI on CKD stage III (base GFR 30, cr 1,5). Will continue diuresis with furosemide, follow on renal panel in am, urine output. BMP today to follow on electrolytes.   4. SIRS. No signs of systemic infection, will hold on antibiotic therapy. Continue to follow on cultures and temperature curve.   5. Hypothyroid. Continue levothyroxine.   DVT prophylaxis: apixaban   Code Status:  dnr  Family Communication: no family at the bedside  Disposition Plan/ discharge barriers: pending clinical improvement    Consultants:     Procedures:     Antimicrobials:       Subjective: Patient this am with severe dyspnea and congestion, positive cough, no chest pain, no nausea or vomiting. At home on bumetanide bid. Uses a wheelchair due to knee arthritis.   Objective: Vitals:   06/30/18 1730 06/30/18 1814 06/30/18 2015 07/01/18 0640  BP: (!) 116/44 (!) 113/41 (!) 111/45 (!) 164/58  Pulse: 99 95 93 (!) 104  Resp: (!) 23 (!) 23 (!) 27 (!) 21  Temp:  98 F (36.7 C) 97.7 F (36.5 C) 98.8 F (37.1 C)  TempSrc:  Oral Oral Oral  SpO2: 97% 91% 96% 94%  Weight:      Height:        Intake/Output Summary (Last 24 hours) at 07/01/2018 0945 Last data filed at 07/01/2018 0700 Gross per 24 hour  Intake 1451.1 ml  Output -  Net 1451.1 ml   Filed Weights   06/30/18 1346  Weight: 85.7 kg    Examination:   General:  deconditioned and ill looking appearing  Neurology: Awake and alert, non focal  E ENT: mild pallor, no icterus, oral mucosa moist Cardiovascular: moderate JVD. S1-S2 present, rhythmic, no gallops or  rubs, positive systolic murmur at the apex. Distant heart sounds. ++ bilateral lower extremity edema. Pulmonary: decreased breath  sounds bilaterally, poor air movement, no wheezing, positive rhonchi and rales bilaterally, predominantly at the bases. Gastrointestinal. Abdomen protuberant no organomegaly, non tender, no rebound or guarding Skin. No rashes Musculoskeletal: no joint deformities     Data Reviewed: I have personally reviewed following labs and imaging studies  CBC: Recent Labs  Lab 06/30/18 1354 06/30/18 1820  WBC 19.3* 16.3*  NEUTROABS 17.5*  --   HGB 11.6* 9.3*  HCT 37.0 29.6*  MCV 91.1 91.9  PLT 243 161   Basic Metabolic Panel: Recent Labs  Lab 06/30/18 1354 06/30/18 1820  NA 138  --   K 4.1  --   CL 102  --   CO2 19*  --   GLUCOSE 143*  --   BUN 34*  --   CREATININE 1.96* 1.93*  CALCIUM 8.6*  --   MG 2.0  --    GFR: Estimated Creatinine Clearance: 20.1 mL/min (A) (by C-G formula based on SCr of 1.93 mg/dL (H)). Liver Function Tests: Recent Labs  Lab 06/30/18 1357 06/30/18 1820  AST 25 18  ALT 14 13  ALKPHOS 119 87  BILITOT 1.1 0.7  PROT 7.5 5.5*  ALBUMIN 2.9* 2.3*   No results for input(s): LIPASE, AMYLASE in the last 168 hours. No results for input(s): AMMONIA in the last 168 hours. Coagulation Profile: No results for input(s): INR, PROTIME in the last 168 hours. Cardiac Enzymes: Recent Labs  Lab 06/30/18 1354 06/30/18 1820 07/01/18 0009 07/01/18 0643  TROPONINI 0.54* 0.14* 0.11* 0.08*   BNP (last 3 results) No results for input(s): PROBNP in the last 8760 hours. HbA1C: No results for input(s): HGBA1C in the last 72 hours. CBG: Recent Labs  Lab 07/01/18 0642  GLUCAP 95   Lipid Profile: No results for input(s): CHOL, HDL, LDLCALC, TRIG, CHOLHDL, LDLDIRECT in the last 72 hours. Thyroid Function Tests: No results for input(s): TSH, T4TOTAL, FREET4, T3FREE, THYROIDAB in the last 72 hours. Anemia Panel: No results for input(s): VITAMINB12, FOLATE, FERRITIN, TIBC, IRON, RETICCTPCT in the last 72 hours.    Radiology Studies: I have reviewed all of the  imaging during this hospital visit personally     Scheduled Meds: . enoxaparin (LOVENOX) injection  30 mg Subcutaneous Q24H  . fluticasone  2 spray Each Nare Daily  . levothyroxine  75 mcg Oral QAC breakfast  . loratadine  10 mg Oral Daily  . Melatonin  3 mg Oral QHS  . traZODone  50 mg Oral QHS   Continuous Infusions: . sodium chloride 10 mL/hr at 06/30/18 1349     LOS: 1 day        Mauricio Gerome Apley, MD [facility] Pager [phone]

## 2018-07-01 NOTE — Progress Notes (Signed)
ANTICOAGULATION CONSULT NOTE - Initial Consult  Pharmacy Consult for apixaban Indication: atrial fibrillation  Allergies  Allergen Reactions  . Sulfa Antibiotics Hives  . Penicillins Itching and Rash    Tolerates ceftriaxone and cefdinir  Has patient had a PCN reaction causing immediate rash, facial/tongue/throat swelling, SOB or lightheadedness with hypotension: Yes Has patient had a PCN reaction causing severe rash involving mucus membranes or skin necrosis: Unk Has patient had a PCN reaction that required hospitalization: Unk Has patient had a PCN reaction occurring within the last 10 years: Unk If all of the above answers are "NO", then may proceed with Cephalosporin use.     Patient Measurements: Height: 5\' 6"  (167.6 cm) Weight: 189 lb (85.7 kg) IBW/kg (Calculated) : 59.3  Vital Signs: Temp: 97.6 F (36.4 C) (09/29 0945) Temp Source: Oral (09/29 0945) BP: 117/88 (09/29 1400) Pulse Rate: 76 (09/29 1400)  Labs: Recent Labs    06/30/18 1354 06/30/18 1820 07/01/18 0009 07/01/18 0643  HGB 11.6* 9.3*  --   --   HCT 37.0 29.6*  --   --   PLT 243 204  --   --   CREATININE 1.96* 1.93*  --   --   TROPONINI 0.54* 0.14* 0.11* 0.08*    Estimated Creatinine Clearance: 20.1 mL/min (A) (by C-G formula based on SCr of 1.93 mg/dL (H)).   Medical History: Past Medical History:  Diagnosis Date  . Anemia   . Arthritis   . CAD (coronary artery disease)   . Cancer [facility])    Endometrial  . Congenital disease    Left forearm  . Dementia   . Diverticulosis   . History of degenerative disc disease    Both knees  . Hypertension   . Osteoporosis    osteopenia  . Thyroid disease    Hypothyroidism    Medications:  Medications Prior to Admission  Medication Sig Dispense Refill Last Dose  . acetaminophen (TYLENOL) 325 MG tablet Take 650 mg by mouth See admin instructions. Take 650 mg by mouth once a day at 2 PM   06/29/2018 at 1400  . acetaminophen (TYLENOL) 500 MG tablet  Take 500 mg by mouth every 6 (six) hours as needed for mild pain, moderate pain, fever or headache.   Unk at [employer]  . albuterol (PROVENTIL HFA;VENTOLIN HFA) 108 (90 Base) MCG/ACT inhaler Inhale 2 puffs into the lungs every 6 (six) hours as needed for wheezing or shortness of breath. 1 Inhaler 2 Unk at Unk  . albuterol (PROVENTIL) (2.5 MG/3ML) 0.083% nebulizer solution Take 2.5 mg by nebulization every 8 (eight) hours as needed for wheezing or shortness of breath.   06/30/2018 at am  . benzonatate (TESSALON) 100 MG capsule Take 100 mg by mouth 3 (three) times daily as needed for cough.   Unk at Unk  . bumetanide (BUMEX) 1 MG tablet Take 2 mg by mouth daily.    06/29/2018 at 0600  . fluticasone (FLONASE) 50 MCG/ACT nasal spray Place 2 sprays into both nostrils daily.   06/29/2018 at 0600  . guaiFENesin-dextromethorphan (ROBITUSSIN DM) 100-10 MG/5ML syrup Take 5 mLs by mouth every 4 (four) hours as needed for cough. 118 mL 0 06/30/2018 at am  . levothyroxine (SYNTHROID, LEVOTHROID) 75 MCG tablet Take 75 mcg by mouth daily before breakfast.   06/29/2018 at 0600  . loratadine (CLARITIN) 10 MG tablet Take 10 mg by mouth daily.   06/29/2018 at 0600  . Melatonin 5 MG TABS Take 5 mg by mouth at  bedtime.    06/29/2018 at 2000  . metoprolol tartrate (LOPRESSOR) 25 MG tablet Take 0.5 tablets (12.5 mg total) by mouth 2 (two) times daily.   06/29/2018 at 2000  . Polyethyl Glyc-Propyl Glyc PF (SYSTANE ULTRA PF) 0.4-0.3 % SOLN Place 1 drop into both eyes See admin instructions. Instill 1 drop into both eyes four times a day and 1 drop into both eyes four times a day as needed for dryness/irritation   06/30/2018 at 1200  . potassium chloride (KLOR-CON) 20 MEQ packet Take 20 mEq by mouth 2 (two) times daily.    06/29/2018 at 2000  . Skin Protectants, Misc. (EUCERIN) cream Apply 1 application topically daily as needed (for dryness or irritation, to LEFT SIDE OF NECK).    Unk at [facility]  . traMADol (ULTRAM) 50 MG tablet Take 1 tablet (50  mg total) by mouth every 8 (eight) hours as needed (pain). Up to 3 times daily as needed  and at bedtime (Patient taking differently: Take 50 mg by mouth every 6 (six) hours as needed (for pain). ) 9 tablet 0 06/29/2018 at am  . traZODone (DESYREL) 50 MG tablet Take 50 mg by mouth at bedtime.  9 06/29/2018 at 2000  . ipratropium-albuterol (DUONEB) 0.5-2.5 (3) MG/3ML SOLN Take 3 mLs by nebulization 3 (three) times daily. (Patient not taking: Reported on 06/30/2018) 360 mL 0 Not Taking at Unknown time    Assessment: 31 YOF with new Afib s/p cardioversion. Pharmacy consulted to start apixaban. SCr 1.93. H/H low. Plt wnl.   Goal of Therapy:  Stroke prevention Monitor platelets by anticoagulation protocol: Yes   Plan:  -Given age and elevated SCr, will start dose adjusted apixaban 2.5 mg twice daily  -Monitor CBC, renal fx and s/s of bleeding  Albertina Parr, PharmD., BCPS Clinical Pharmacist Clinical phone for 07/01/18 until 8:30pm: [phone]

## 2018-07-02 ENCOUNTER — Inpatient Hospital Stay (HOSPITAL_COMMUNITY): Payer: Medicare Other

## 2018-07-02 DIAGNOSIS — R0600 Dyspnea, unspecified: Secondary | ICD-10-CM

## 2018-07-02 LAB — BASIC METABOLIC PANEL
Anion gap: 11 (ref 5–15)
BUN: 26 mg/dL — AB (ref 8–23)
CHLORIDE: 101 mmol/L (ref 98–111)
CO2: 26 mmol/L (ref 22–32)
Calcium: 8.3 mg/dL — ABNORMAL LOW (ref 8.9–10.3)
Creatinine, Ser: 1.43 mg/dL — ABNORMAL HIGH (ref 0.44–1.00)
GFR calc Af Amer: 35 mL/min — ABNORMAL LOW (ref >60)
GFR calc non Af Amer: 31 mL/min — ABNORMAL LOW (ref >60)
GLUCOSE: 122 mg/dL — AB (ref 70–99)
POTASSIUM: 3.5 mmol/L (ref 3.5–5.1)
Sodium: 138 mmol/L (ref 135–145)

## 2018-07-02 LAB — ECHOCARDIOGRAM COMPLETE
Height: 66 in
Weight: 2934.76 oz

## 2018-07-02 LAB — CBC WITH DIFFERENTIAL/PLATELET
ABS IMMATURE GRANULOCYTES: 0.4 10*3/uL — AB (ref 0.0–0.1)
Basophils Absolute: 0.1 10*3/uL (ref 0.0–0.1)
Basophils Relative: 1 %
EOS PCT: 2 %
Eosinophils Absolute: 0.3 10*3/uL (ref 0.0–0.7)
HEMATOCRIT: 33.1 % — AB (ref 36.0–46.0)
HEMOGLOBIN: 10.4 g/dL — AB (ref 12.0–15.0)
Immature Granulocytes: 2 %
LYMPHS ABS: 0.6 10*3/uL — AB (ref 0.7–4.0)
LYMPHS PCT: 3 %
MCH: 28.6 pg (ref 26.0–34.0)
MCHC: 31.4 g/dL (ref 30.0–36.0)
MCV: 90.9 fL (ref 78.0–100.0)
MONO ABS: 1.1 10*3/uL — AB (ref 0.1–1.0)
MONOS PCT: 6 %
NEUTROS ABS: 14.8 10*3/uL — AB (ref 1.7–7.7)
NEUTROS PCT: 86 %
Platelets: 236 10*3/uL (ref 150–400)
RBC: 3.64 MIL/uL — ABNORMAL LOW (ref 3.87–5.11)
RDW: 15.2 % (ref 11.5–15.5)
WBC: 17.2 10*3/uL — ABNORMAL HIGH (ref 4.0–10.5)

## 2018-07-02 LAB — GLUCOSE, CAPILLARY: GLUCOSE-CAPILLARY: 110 mg/dL — AB (ref 70–99)

## 2018-07-02 LAB — RESPIRATORY PANEL BY PCR
ADENOVIRUS-RVPPCR: NOT DETECTED
Bordetella pertussis: NOT DETECTED
CORONAVIRUS HKU1-RVPPCR: NOT DETECTED
CORONAVIRUS NL63-RVPPCR: NOT DETECTED
CORONAVIRUS OC43-RVPPCR: NOT DETECTED
Chlamydophila pneumoniae: NOT DETECTED
Coronavirus 229E: NOT DETECTED
INFLUENZA A-RVPPCR: NOT DETECTED
Influenza B: NOT DETECTED
METAPNEUMOVIRUS-RVPPCR: NOT DETECTED
Mycoplasma pneumoniae: NOT DETECTED
PARAINFLUENZA VIRUS 2-RVPPCR: NOT DETECTED
PARAINFLUENZA VIRUS 4-RVPPCR: NOT DETECTED
Parainfluenza Virus 1: NOT DETECTED
Parainfluenza Virus 3: NOT DETECTED
RHINOVIRUS / ENTEROVIRUS - RVPPCR: NOT DETECTED
Respiratory Syncytial Virus: NOT DETECTED

## 2018-07-02 MED ORDER — GUAIFENESIN-DM 100-10 MG/5ML PO SYRP
5.0000 mL | ORAL_SOLUTION | Freq: Four times a day (QID) | ORAL | Status: DC
  Administered 2018-07-02 – 2018-07-03 (×5): 5 mL via ORAL
  Filled 2018-07-02 (×5): qty 5

## 2018-07-02 NOTE — Progress Notes (Signed)
PT Cancellation Note  Patient Details Name: [name] [name] MRN: [id number] DOB: 19-Mar-1923   Cancelled Treatment:    Reason Eval/Treat Not Completed: Fatigue/lethargy limiting ability to participate(pt maintaining eyes open, head down and stating she is fatigued, wanting to sleep and will not participate at this time)   Kostas Marrow B Fenix Ruppe 07/02/2018, 1:51 PM Elwyn Reach, PT Acute Rehabilitation Services Pager: [phone] Office: [phone]

## 2018-07-02 NOTE — Progress Notes (Signed)
PROGRESS NOTE    Raeana Blinn  HCW:[id number] DOB: 06-03-1923 DOA: 06/30/2018 PCP: Lajean Manes, MD    Brief Narrative:  84 year old female who presented with fatigue and weakness.  She does have significant past medical history for hypothyroidism, osteoporosis, hypertension, dementia, coronary disease, diverticulosis status post colostomy and history of endometrial cancer.  Reported not feeling well for the last 7 days, with significant decreased oral intake.  On the initial physical examination her heart rate was 180-200 irregular, and underwent cardioversion in the ED. on admission, blood pressure is 108/40, heart rate 108, respiratory 27, temperature 100.5, oxygen saturation 97%.  Dry mucous membranes, lungs with bilateral rales, heart S1-S2 present, regular, abdomen soft nontender, no lower extremity edema.  Sodium 138, potassium 4.1, chloride 102, bicarb 19, glucose 143, BUN 34, creatinine 1.9, white count 19.3, hemoglobin 9.6, hematocrit 37.0, platelets 243, troponin I 0.54, urinalysis negative for infection, chest radiograph with increased lung markings, EKG had atrial fibrillation, heart rate 180 bpm, left axis, depressions.  Patient was admitted to the hospital with the working diagnosis of systemic inflammatory syndrome complicated by fibrillation with rapid ventricular response and acute kidney injury.    Assessment & Plan:   Active Problems:   SIRS (systemic inflammatory response syndrome) (HCC)   Sinus tachycardia   HTN (hypertension)   Atrial fibrillation with rapid ventricular response ([place])   1. Acute on chronic diastolic heart failure decompensation. Improved volume status but not back yet to baseline, urine output over last 24 hours 2,700 ml. Continue current dose of furosemide of 60 mg IV q12 hours. Continue metoprolol for beta blocker. Holding ace inh due to aki. Last echocardiography from June 2008 with normal LV systolic function 60 to 17%. With elevated PA pressure at 39  mmHg.    2. Atrial fibrillation with RVR. sp cardioversion. She has remained on on sinus rhythm, personally reviewed telemetry. Tolerating well metoprolol 25 mg po bid, will continue anticoagulation with apixaban, renally dosage.   3. AKI on CKD stage III (base GFR 30, cr 1,5). On furosemide IV for diuresis, renal function with serum cr at 1,43 with K at 3,5 and serum bicarbonate 26. Continue to follow up renal function and electrolytes.   4. SIRS. Patient has remained afebrile, but positive leukocytosis, will repeat chest film post diuresis, to rule out pulmonary infection. Patient with persistent cough. Cultures have been no growth. Will continue to hold on antibiotic therapy for now. Follow up chest film with right base atelectasis, continue to hold on antibiotic therapy. (sepsis is ruled out for now).   5. Hypothyroid. On levothyroxine.   DVT prophylaxis: apixaban   Code Status:  dnr  Family Communication: no family at the bedside  Disposition Plan/ discharge barriers: pending clinical improvement    Consultants:     Procedures:     Antimicrobials:       Subjective: Patient reports improved dyspnea, but not back to baseline, positive cough, no chest pain or palpitation. No nausea or vomiting. Very weak and deconditioned   Objective: Vitals:   07/01/18 1600 07/01/18 1700 07/01/18 1800 07/02/18 0351  BP: 138/62 (!) 157/68 (!) 158/56   Pulse: 76 83 88   Resp: 20 (!) 25 18   Temp: 97.7 F (36.5 C)     TempSrc: Oral     SpO2: 96% 96% 95%   Weight:    83.2 kg  Height:        Intake/Output Summary (Last 24 hours) at 07/02/2018 1102 Last data filed at 07/02/2018  1000 Gross per 24 hour  Intake 600 ml  Output 2950 ml  Net -2350 ml   Filed Weights   06/30/18 1346 07/02/18 0351  Weight: 85.7 kg 83.2 kg    Examination:   General: deconditioned and ill looking appearing  Neurology: Awake and alert, non focal  E ENT: continue pallor, no icterus, oral  mucosa moist Cardiovascular: No JVD. S1-S2 present, rhythmic, no gallops, rubs, or murmurs. ++ pitting lower extremity edema. Pulmonary: positive breath sounds bilaterally, decreased air movement, no wheezing, or rhonchi but scattered rales. Gastrointestinal. Abdomen protuberant with no organomegaly, non tender, no rebound or guarding Skin. No rashes Musculoskeletal: no joint deformities     Data Reviewed: I have personally reviewed following labs and imaging studies  CBC: Recent Labs  Lab 06/30/18 1354 06/30/18 1820 07/02/18 0242  WBC 19.3* 16.3* 17.2*  NEUTROABS 17.5*  --  14.8*  HGB 11.6* 9.3* 10.4*  HCT 37.0 29.6* 33.1*  MCV 91.1 91.9 90.9  PLT 243 204 831   Basic Metabolic Panel: Recent Labs  Lab 06/30/18 1354 06/30/18 1820 07/01/18 1601 07/02/18 0242  NA 138  --  138 138  K 4.1  --  3.9 3.5  CL 102  --  107 101  CO2 19*  --  23 26  GLUCOSE 143*  --  92 122*  BUN 34*  --  29* 26*  CREATININE 1.96* 1.93* 1.50* 1.43*  CALCIUM 8.6*  --  7.9* 8.3*  MG 2.0  --   --   --    GFR: Estimated Creatinine Clearance: 26.7 mL/min (A) (by C-G formula based on SCr of 1.43 mg/dL (H)). Liver Function Tests: Recent Labs  Lab 06/30/18 1357 06/30/18 1820  AST 25 18  ALT 14 13  ALKPHOS 119 87  BILITOT 1.1 0.7  PROT 7.5 5.5*  ALBUMIN 2.9* 2.3*   No results for input(s): LIPASE, AMYLASE in the last 168 hours. No results for input(s): AMMONIA in the last 168 hours. Coagulation Profile: No results for input(s): INR, PROTIME in the last 168 hours. Cardiac Enzymes: Recent Labs  Lab 06/30/18 1354 06/30/18 1820 07/01/18 0009 07/01/18 0643  TROPONINI 0.54* 0.14* 0.11* 0.08*   BNP (last 3 results) No results for input(s): PROBNP in the last 8760 hours. HbA1C: No results for input(s): HGBA1C in the last 72 hours. CBG: Recent Labs  Lab 07/01/18 0642 07/02/18 0704  GLUCAP 95 110*   Lipid Profile: No results for input(s): CHOL, HDL, LDLCALC, TRIG, CHOLHDL, LDLDIRECT  in the last 72 hours. Thyroid Function Tests: No results for input(s): TSH, T4TOTAL, FREET4, T3FREE, THYROIDAB in the last 72 hours. Anemia Panel: No results for input(s): VITAMINB12, FOLATE, FERRITIN, TIBC, IRON, RETICCTPCT in the last 72 hours.    Radiology Studies: I have reviewed all of the imaging during this hospital visit personally     Scheduled Meds: . apixaban  2.5 mg Oral BID  . aspirin EC  81 mg Oral Daily  . fluticasone  2 spray Each Nare Daily  . furosemide  60 mg Intravenous Q12H  . levothyroxine  75 mcg Oral QAC breakfast  . loratadine  10 mg Oral Daily  . Melatonin  3 mg Oral QHS  . metoprolol tartrate  25 mg Oral BID  . traZODone  50 mg Oral QHS   Continuous Infusions: . sodium chloride 10 mL/hr at 06/30/18 1349     LOS: 2 days        Pang Robers Gerome Apley, MD [facility] Pager [phone]

## 2018-07-02 NOTE — Progress Notes (Signed)
  Echocardiogram 2D Echocardiogram has been performed.  Mckensi Redinger L Androw 07/02/2018, 3:43 PM

## 2018-07-03 ENCOUNTER — Encounter (HOSPITAL_COMMUNITY): Payer: Self-pay | Admitting: *Deleted

## 2018-07-03 LAB — CBC WITH DIFFERENTIAL/PLATELET
ABS IMMATURE GRANULOCYTES: 0.7 10*3/uL — AB (ref 0.0–0.1)
Basophils Absolute: 0.1 10*3/uL (ref 0.0–0.1)
Basophils Relative: 1 %
EOS ABS: 0.5 10*3/uL (ref 0.0–0.7)
Eosinophils Relative: 3 %
HEMATOCRIT: 33.5 % — AB (ref 36.0–46.0)
Hemoglobin: 10.8 g/dL — ABNORMAL LOW (ref 12.0–15.0)
IMMATURE GRANULOCYTES: 4 %
LYMPHS ABS: 0.9 10*3/uL (ref 0.7–4.0)
Lymphocytes Relative: 5 %
MCH: 29.1 pg (ref 26.0–34.0)
MCHC: 32.2 g/dL (ref 30.0–36.0)
MCV: 90.3 fL (ref 78.0–100.0)
MONO ABS: 1.3 10*3/uL — AB (ref 0.1–1.0)
MONOS PCT: 8 %
NEUTROS ABS: 13.3 10*3/uL — AB (ref 1.7–7.7)
NEUTROS PCT: 79 %
Platelets: 271 10*3/uL (ref 150–400)
RBC: 3.71 MIL/uL — ABNORMAL LOW (ref 3.87–5.11)
RDW: 14.8 % (ref 11.5–15.5)
WBC: 16.7 10*3/uL — ABNORMAL HIGH (ref 4.0–10.5)

## 2018-07-03 LAB — BASIC METABOLIC PANEL
ANION GAP: 17 — AB (ref 5–15)
BUN: 29 mg/dL — AB (ref 8–23)
CO2: 29 mmol/L (ref 22–32)
Calcium: 8.8 mg/dL — ABNORMAL LOW (ref 8.9–10.3)
Chloride: 96 mmol/L — ABNORMAL LOW (ref 98–111)
Creatinine, Ser: 1.39 mg/dL — ABNORMAL HIGH (ref 0.44–1.00)
GFR calc Af Amer: 37 mL/min — ABNORMAL LOW (ref >60)
GFR calc non Af Amer: 32 mL/min — ABNORMAL LOW (ref >60)
Glucose, Bld: 100 mg/dL — ABNORMAL HIGH (ref 70–99)
POTASSIUM: 2.9 mmol/L — AB (ref 3.5–5.1)
Sodium: 142 mmol/L (ref 135–145)

## 2018-07-03 LAB — GLUCOSE, CAPILLARY: Glucose-Capillary: 122 mg/dL — ABNORMAL HIGH (ref 70–99)

## 2018-07-03 MED ORDER — POTASSIUM CHLORIDE CRYS ER 20 MEQ PO TBCR
40.0000 meq | EXTENDED_RELEASE_TABLET | Freq: Once | ORAL | Status: AC
  Administered 2018-07-03: 40 meq via ORAL
  Filled 2018-07-03: qty 2

## 2018-07-03 MED ORDER — FUROSEMIDE 10 MG/ML IJ SOLN
60.0000 mg | Freq: Every day | INTRAMUSCULAR | Status: DC
  Administered 2018-07-04: 60 mg via INTRAVENOUS
  Filled 2018-07-03: qty 6

## 2018-07-03 MED ORDER — GUAIFENESIN-DM 100-10 MG/5ML PO SYRP
10.0000 mL | ORAL_SOLUTION | Freq: Four times a day (QID) | ORAL | Status: DC
  Administered 2018-07-03 – 2018-07-08 (×20): 10 mL via ORAL
  Filled 2018-07-03 (×20): qty 10

## 2018-07-03 NOTE — Evaluation (Signed)
Physical Therapy Evaluation Patient Details Name: [name] [name] MRN: [id number] DOB: Sep 05, 1923 Today's Date: 07/03/2018   History of Present Illness  Pt presented with fatigue and weakness and found to have SIRS and acute on chronic heart failure. PMH - O2 dependent copd, dementia, colostomy, heart failure, endometrial CA  Clinical Impression  Pt presents to PT requiring 2 person assist to transfer from bed to chair. Unsure if ALF can provide this level of assist. If ALF can provide this level of assist could return there with HHPT. If they cannot then pt will need SNF.     Follow Up Recommendations SNF;Supervision/Assistance - 24 hour(unless ALF can provide increased assist)    Equipment Recommendations  None recommended by PT    Recommendations for Other Services       Precautions / Restrictions Precautions Precautions: Fall      Mobility  Bed Mobility Overal bed mobility: Needs Assistance Bed Mobility: Supine to Sit     Supine to sit: Mod assist     General bed mobility comments: assist to bring legs off of bed, elevate trunk into sitting, and bring hips to EOB  Transfers Overall transfer level: Needs assistance Equipment used: 2 person hand held assist Transfers: Sit to/from Stand;Stand Pivot Transfers Sit to Stand: +2 physical assistance;Mod assist Stand pivot transfers: +2 physical assistance;Mod assist       General transfer comment: Assist to bring hips up and for balance. Small pivotal steps bed to chair.   Ambulation/Gait                Stairs            Wheelchair Mobility    Modified Rankin (Stroke Patients Only)       Balance Overall balance assessment: Needs assistance Sitting-balance support: Bilateral upper extremity supported;Feet supported Sitting balance-Leahy Scale: Poor Sitting balance - Comments: UE assist and min guard assist   Standing balance support: Bilateral upper extremity supported Standing balance-Leahy Scale:  Zero Standing balance comment: +2 mod assist for static standing                             Pertinent Vitals/Pain Pain Assessment: Faces Faces Pain Scale: Hurts even more Pain Location: back Pain Descriptors / Indicators: Grimacing Pain Intervention(s): Limited activity within patient's tolerance;Repositioned;Heat applied    Home Living Family/patient expects to be discharged to:: Assisted living               Home Equipment: Wheelchair - manual      Prior Function Level of Independence: Needs assistance   Gait / Transfers Assistance Needed: Uses w/c at ALF and propels with her feet. Unsure of level of assist for transfers.            Hand Dominance   Dominant Hand: Right    Extremity/Trunk Assessment   Upper Extremity Assessment Upper Extremity Assessment: Generalized weakness    Lower Extremity Assessment Lower Extremity Assessment: Generalized weakness       Communication   Communication: No difficulties  Cognition Arousal/Alertness: Awake/alert Behavior During Therapy: WFL for tasks assessed/performed Overall Cognitive Status: No family/caregiver present to determine baseline cognitive functioning                                 General Comments: Some dementia at baseline per chart      General Comments  Exercises     Assessment/Plan    PT Assessment Patient needs continued PT services  PT Problem List Decreased strength;Decreased activity tolerance;Decreased balance;Decreased mobility;Pain;Obesity       PT Treatment Interventions DME instruction;Gait training;Functional mobility training;Therapeutic activities;Therapeutic exercise;Balance training;Patient/family education    PT Goals (Current goals can be found in the Care Plan section)  Acute Rehab PT Goals Patient Stated Goal: not stated PT Goal Formulation: With patient Time For Goal Achievement: 07/17/18 Potential to Achieve Goals: Fair     Frequency Min 3X/week   Barriers to discharge        Co-evaluation               AM-PAC PT "6 Clicks" Daily Activity  Outcome Measure Difficulty turning over in bed (including adjusting bedclothes, sheets and blankets)?: Unable Difficulty moving from lying on back to sitting on the side of the bed? : Unable Difficulty sitting down on and standing up from a chair with arms (e.g., wheelchair, bedside commode, etc,.)?: Unable Help needed moving to and from a bed to chair (including a wheelchair)?: A Lot Help needed walking in hospital room?: Total Help needed climbing 3-5 steps with a railing? : Total 6 Click Score: 7    End of Session Equipment Utilized During Treatment: Gait belt;Oxygen Activity Tolerance: Patient limited by fatigue;Patient limited by pain Patient left: in chair;with call bell/phone within reach;with chair alarm set Nurse Communication: Mobility status(nurse tech assist with transfer) PT Visit Diagnosis: Other abnormalities of gait and mobility (R26.89);Muscle weakness (generalized) (M62.81);Pain Pain - Right/Left: (mid) Pain - part of body: (back)    Time: 9678-9381 PT Time Calculation (min) (ACUTE ONLY): 27 min   Charges:   PT Evaluation $PT Eval Moderate Complexity: 1 Mod PT Treatments $Therapeutic Activity: 8-22 mins        [place] Pager [phone] Office [place] 07/03/2018, 5:47 PM

## 2018-07-03 NOTE — Progress Notes (Signed)
PROGRESS NOTE    [name] [name]  HGD:[id number] DOB: Dec 22, 1922 DOA: 06/30/2018 PCP: Lajean Manes, MD    Brief Narrative:  84 year old female who presented with fatigue and weakness. She does have significant past medical history for hypothyroidism, osteoporosis, hypertension, dementia, coronary disease, diverticulosis status post colostomy andhistory of endometrial cancer. Reported not feeling well for the last 7 days, with significant decreased oral intake.On the initial physical examination her heart rate was 180-200 irregular,and underwent cardioversionin the ED.on admission, blood pressure is 108/40, heart rate 108, respiratory 27, temperature 100.5, oxygen saturation 97%.Dry mucous membranes, lungs with bilateral rales, heart S1-S2 present, regular, abdomen soft nontender, no lower extremity edema. Sodium 138, potassium 4.1, chloride 102, bicarb 19, glucose 143, BUN 34, creatinine 1.9, white count 19.3, hemoglobin 9.6, hematocrit 37.0,platelets 243, troponinI0.54,urinalysis negative for infection,chest radiograph with increased lung markings, EKG had atrial fibrillation, heart rate 180bpm, left axis, depressions.  Patientwas admitted to the hospitalwiththeworking diagnosis of systemic inflammatory syndromecomplicated by fibrillationwith rapid ventricular response and acute kidney injury.   Assessment & Plan:   Active Problems:   SIRS (systemic inflammatory response syndrome) (HCC)   Sinus tachycardia   HTN (hypertension)   Atrial fibrillation with rapid ventricular response ([place])   1. Acute on chronic diastolic heart failure decompensation/  LV systolic function 60 to 96%.Will continue diuresis with furosemide, will decrease dose to 60 mg daily and continue to monitor volume status. Her urine output over last 24 hours 1,650 ml. Continue beta blockade with metoprolol and will hold on ace inh due to elevated cr.   2. Atrial fibrillation with RVR. sp (ED)  cardioversion. Telemetry personally reviewed, patient has remained on sins rhythm. Will continue anticoagulation with apixaban and rate control with metoprolol.   3. AKI on CKD stage III (base GFR 30, cr 1,5) with new hypokalemia. Renal function with serum  Cr at 1.39 with K down to 2,9.  Will order 40 meq kcl and will follow on renal panel in am. Avoid hypotension or nephrotoxic medications, continue furosemide 60 mg IV daily.    4. Leukocytosis. Seems to be reactive, patient has remained afebrile, chest film with no pneumonic infiltrate, will follow cell count in am. Continue to hold on antibiotic therapy. Will continue guaifenesin for cough suppression and will add chest PT.        5. Hypothyroid. On levothyroxine.  DVT prophylaxis:apixaban Code Status:dnr Family Communication:no family at the bedside Disposition Plan/ discharge barriers:pending clinical improvement, patient may need SNF.    Consultants:    Procedures:    Antimicrobials:     Subjective: Patient continue to have persistent dry cough, dyspnea has improved but not back to baseline. She is feeling very weak and deconditioned. Currently on assisted living but may need SNF that she agrees.   Objective: Vitals:   07/03/18 0347 07/03/18 0352 07/03/18 0355 07/03/18 0800  BP: (!) 128/107 (!) 120/59  123/60  Pulse: 79 79  71  Resp: (!) 22 (!) 25  18  Temp: (!) 96.7 F (35.9 C)     TempSrc: Axillary     SpO2: 92% 95%  93%  Weight:   79 kg   Height:        Intake/Output Summary (Last 24 hours) at 07/03/2018 1137 Last data filed at 07/03/2018 0600 Gross per 24 hour  Intake 1080 ml  Output 1100 ml  Net -20 ml   Filed Weights   06/30/18 1346 07/02/18 0351 07/03/18 0355  Weight: 85.7 kg 83.2 kg 79 kg  Examination:   General: deconditioned and ill looking appearing  Neurology: Awake and alert, non focal  E ENT: mild pallor, no icterus, oral mucosa moist Cardiovascular: Mild JVD.  S1-S2 present, rhythmic, no gallops, rubs, or murmurs. Trace lower extremity edema. Pulmonary: decreased breath sounds bilaterally, poor air movement, no wheezing or  Rhonchi, scattered rales.  Gastrointestinal. Abdomen protuberant with no organomegaly, non tender, no rebound or guarding Skin. No rashes Musculoskeletal: no joint deformities     Data Reviewed: I have personally reviewed following labs and imaging studies  CBC: Recent Labs  Lab 06/30/18 1354 06/30/18 1820 07/02/18 0242 07/03/18 0331  WBC 19.3* 16.3* 17.2* 16.7*  NEUTROABS 17.5*  --  14.8* 13.3*  HGB 11.6* 9.3* 10.4* 10.8*  HCT 37.0 29.6* 33.1* 33.5*  MCV 91.1 91.9 90.9 90.3  PLT 243 204 236 299   Basic Metabolic Panel: Recent Labs  Lab 06/30/18 1354 06/30/18 1820 07/01/18 1601 07/02/18 0242 07/03/18 0331  NA 138  --  138 138 142  K 4.1  --  3.9 3.5 2.9*  CL 102  --  107 101 96*  CO2 19*  --  23 26 29   GLUCOSE 143*  --  92 122* 100*  BUN 34*  --  29* 26* 29*  CREATININE 1.96* 1.93* 1.50* 1.43* 1.39*  CALCIUM 8.6*  --  7.9* 8.3* 8.8*  MG 2.0  --   --   --   --    GFR: Estimated Creatinine Clearance: 26.8 mL/min (A) (by C-G formula based on SCr of 1.39 mg/dL (H)). Liver Function Tests: Recent Labs  Lab 06/30/18 1357 06/30/18 1820  AST 25 18  ALT 14 13  ALKPHOS 119 87  BILITOT 1.1 0.7  PROT 7.5 5.5*  ALBUMIN 2.9* 2.3*   No results for input(s): LIPASE, AMYLASE in the last 168 hours. No results for input(s): AMMONIA in the last 168 hours. Coagulation Profile: No results for input(s): INR, PROTIME in the last 168 hours. Cardiac Enzymes: Recent Labs  Lab 06/30/18 1354 06/30/18 1820 07/01/18 0009 07/01/18 0643  TROPONINI 0.54* 0.14* 0.11* 0.08*   BNP (last 3 results) No results for input(s): PROBNP in the last 8760 hours. HbA1C: No results for input(s): HGBA1C in the last 72 hours. CBG: Recent Labs  Lab 07/01/18 0642 07/02/18 0704 07/03/18 0615  GLUCAP 95 110* 122*   Lipid  Profile: No results for input(s): CHOL, HDL, LDLCALC, TRIG, CHOLHDL, LDLDIRECT in the last 72 hours. Thyroid Function Tests: No results for input(s): TSH, T4TOTAL, FREET4, T3FREE, THYROIDAB in the last 72 hours. Anemia Panel: No results for input(s): VITAMINB12, FOLATE, FERRITIN, TIBC, IRON, RETICCTPCT in the last 72 hours.    Radiology Studies: I have reviewed all of the imaging during this hospital visit personally     Scheduled Meds: . apixaban  2.5 mg Oral BID  . fluticasone  2 spray Each Nare Daily  . furosemide  60 mg Intravenous Q12H  . guaiFENesin-dextromethorphan  5 mL Oral Q6H  . levothyroxine  75 mcg Oral QAC breakfast  . loratadine  10 mg Oral Daily  . Melatonin  3 mg Oral QHS  . metoprolol tartrate  25 mg Oral BID  . potassium chloride  40 mEq Oral Once  . traZODone  50 mg Oral QHS   Continuous Infusions: . sodium chloride 10 mL/hr at 06/30/18 1349     LOS: 3 days        Mauricio Gerome Apley, MD [facility] Pager [phone]

## 2018-07-03 NOTE — Care Management (Signed)
#   3.   S/W VINN  @ [facility] # [phone]   ELIQUIS  5 MG BID COVER- YES CO-PAY- $ 45.00  Q/L TWO PILL PER DAY TIER- 3 DRUG PRIOR APPROVAL- NO  NO DEDUCTIBLE PREFERRED PHARMACY : YES   [facility]

## 2018-07-04 DIAGNOSIS — E875 Hyperkalemia: Secondary | ICD-10-CM

## 2018-07-04 DIAGNOSIS — I5033 Acute on chronic diastolic (congestive) heart failure: Secondary | ICD-10-CM

## 2018-07-04 DIAGNOSIS — I248 Other forms of acute ischemic heart disease: Secondary | ICD-10-CM

## 2018-07-04 DIAGNOSIS — J9601 Acute respiratory failure with hypoxia: Secondary | ICD-10-CM

## 2018-07-04 LAB — CBC WITH DIFFERENTIAL/PLATELET
BASOS PCT: 1 %
Basophils Absolute: 0.1 10*3/uL (ref 0.0–0.1)
EOS PCT: 4 %
Eosinophils Absolute: 0.6 10*3/uL (ref 0.0–0.7)
HEMATOCRIT: 32.8 % — AB (ref 36.0–46.0)
HEMOGLOBIN: 10.5 g/dL — AB (ref 12.0–15.0)
Lymphocytes Relative: 11 %
Lymphs Abs: 1.6 10*3/uL (ref 0.7–4.0)
MCH: 29.1 pg (ref 26.0–34.0)
MCHC: 32 g/dL (ref 30.0–36.0)
MCV: 90.9 fL (ref 78.0–100.0)
MONO ABS: 1 10*3/uL (ref 0.1–1.0)
MONOS PCT: 7 %
NEUTROS PCT: 77 %
Neutro Abs: 11.3 10*3/uL — ABNORMAL HIGH (ref 1.7–7.7)
PLATELETS: 295 10*3/uL (ref 150–400)
RBC: 3.61 MIL/uL — AB (ref 3.87–5.11)
RDW: 14.8 % (ref 11.5–15.5)
WBC: 14.6 10*3/uL — AB (ref 4.0–10.5)

## 2018-07-04 LAB — BASIC METABOLIC PANEL
ANION GAP: 9 (ref 5–15)
BUN: 34 mg/dL — ABNORMAL HIGH (ref 8–23)
CO2: 29 mmol/L (ref 22–32)
Calcium: 8.2 mg/dL — ABNORMAL LOW (ref 8.9–10.3)
Chloride: 97 mmol/L — ABNORMAL LOW (ref 98–111)
Creatinine, Ser: 1.43 mg/dL — ABNORMAL HIGH (ref 0.44–1.00)
GFR calc Af Amer: 35 mL/min — ABNORMAL LOW (ref >60)
GFR calc non Af Amer: 31 mL/min — ABNORMAL LOW (ref >60)
Glucose, Bld: 104 mg/dL — ABNORMAL HIGH (ref 70–99)
POTASSIUM: 5.5 mmol/L — AB (ref 3.5–5.1)
Sodium: 135 mmol/L (ref 135–145)

## 2018-07-04 LAB — GLUCOSE, CAPILLARY: Glucose-Capillary: 125 mg/dL — ABNORMAL HIGH (ref 70–99)

## 2018-07-04 MED ORDER — ADULT MULTIVITAMIN W/MINERALS CH
1.0000 | ORAL_TABLET | Freq: Every day | ORAL | Status: DC
  Administered 2018-07-04 – 2018-07-08 (×5): 1 via ORAL
  Filled 2018-07-04 (×5): qty 1

## 2018-07-04 MED ORDER — FUROSEMIDE 10 MG/ML IJ SOLN
40.0000 mg | Freq: Two times a day (BID) | INTRAMUSCULAR | Status: DC
  Administered 2018-07-04 – 2018-07-06 (×5): 40 mg via INTRAVENOUS
  Filled 2018-07-04 (×5): qty 4

## 2018-07-04 MED ORDER — ENSURE ENLIVE PO LIQD
237.0000 mL | Freq: Three times a day (TID) | ORAL | Status: DC
  Administered 2018-07-04 – 2018-07-06 (×2): 237 mL via ORAL

## 2018-07-04 NOTE — Progress Notes (Signed)
   07/04/18 1416  Clinical Encounter Type  Visited With Family  Visit Type Initial  Referral From Other (Comment)  Stress Factors  Family Stress Factors Health changes;Exhausted;Lack of caregivers   While doing rounds I met family outside room. Family was waiting on Education officer, museum. Family wanted information on what to do next for PT. I offered spiritual care with ministry of presence and words of encouragement. Social worker arrived. Chaplain available as needed.   Chaplain Fidel Levy

## 2018-07-04 NOTE — NC FL2 (Signed)
[place] MEDICAID FL2 LEVEL OF CARE SCREENING TOOL     IDENTIFICATION  Patient Name: [name] [name] Birthdate: 1923/09/27 Sex: female Admission Date (Current Location): 06/30/2018  [facility] [place] Number:  Herbalist and Address:  [facility]. [facility], [place] [address], [place], [place] [address]      Provider Number: [id number]  Attending Physician Name and Address:  Samuella Cota, MD  Relative Name and Phone Number:       Current Level of Care: Hospital Recommended Level of Care: [place] Prior Approval Number:    Date Approved/Denied:   PASRR Number: [id number]  Discharge Plan: SNF    Current Diagnoses: Patient Active Problem List   Diagnosis Date Noted  . Acute hypoxemic respiratory failure ([place]) 07/04/2018  . Acute on chronic diastolic CHF (congestive heart failure) ([place]) 07/04/2018  . Hyperkalemia 07/04/2018  . Demand ischemia ([place]) 07/04/2018  . Atrial fibrillation with rapid ventricular response ([place]) 06/30/2018  . Heart failure, diastolic, acute on chronic ([place]) 03/05/2018  . Acute congestive heart failure ([place])   . HTN (hypertension) 10/12/2016  . COPD exacerbation ([place])   . CAP (community acquired pneumonia) 10/11/2016  . Colostomy in place [facility]) 12/10/2013    Orientation RESPIRATION BLADDER Height & Weight     Self, Time, Situation, Place  O2(Nasal Canula 2 L) Incontinent, External catheter Weight: 174 lb 2.6 oz (79 kg) Height:  5\' 6"  (167.6 cm)  BEHAVIORAL SYMPTOMS/MOOD NEUROLOGICAL BOWEL NUTRITION STATUS  (None) (None) Continent Diet(Heart healthy)  AMBULATORY STATUS COMMUNICATION OF NEEDS Skin   Extensive Assist Verbally Normal                       Personal Care Assistance Level of Assistance              Functional Limitations Info  Sight, Hearing, Speech Sight Info: Adequate Hearing Info: Adequate Speech Info: Adequate    SPECIAL CARE FACTORS FREQUENCY  PT (By licensed PT), Blood  pressure     PT Frequency: 5 x week              Contractures Contractures Info: Not present    Additional Factors Info  Code Status, Allergies Code Status Info: DNR Allergies Info: Sulfa Antibiotics, Penicillins           Current Medications (07/04/2018):  This is the current hospital active medication list Current Facility-Administered Medications  Medication Dose Route Frequency Provider Last Rate Last Dose  . 0.9 %  sodium chloride infusion   Intravenous Continuous Sid Falcon, MD 10 mL/hr at 06/30/18 1349    . acetaminophen (TYLENOL) tablet 650 mg  650 mg Oral Q6H PRN Sid Falcon, MD   650 mg at 07/03/18 0827   Or  . acetaminophen (TYLENOL) suppository 650 mg  650 mg Rectal Q6H PRN Gilles Chiquito B, MD      . albuterol (PROVENTIL) (2.5 MG/3ML) 0.083% nebulizer solution 2.5 mg  2.5 mg Nebulization Q8H PRN Sid Falcon, MD      . apixaban Arne Cleveland) tablet 2.5 mg  2.5 mg Oral BID Lavenia Atlas, RPH   2.5 mg at 07/04/18 1050  . benzonatate (TESSALON) capsule 100 mg  100 mg Oral TID PRN Sid Falcon, MD   100 mg at 07/02/18 2022  . feeding supplement (ENSURE ENLIVE) (ENSURE ENLIVE) liquid 237 mL  237 mL Oral TID BM Samuella Cota, MD   237 mL at 07/04/18 1050  .  fluticasone (FLONASE) 50 MCG/ACT nasal spray 2 spray  2 spray Each Nare Daily Gilles Chiquito B, MD   2 spray at 07/04/18 1051  . furosemide (LASIX) injection 40 mg  40 mg Intravenous Q12H Samuella Cota, MD      . guaiFENesin-dextromethorphan (ROBITUSSIN DM) 100-10 MG/5ML syrup 10 mL  10 mL Oral Q6H Arrien, Jimmy Picket, MD   10 mL at 07/04/18 1330  . hydrocerin (EUCERIN) cream 1 application  1 application Topical Daily PRN Sid Falcon, MD      . levothyroxine (SYNTHROID, LEVOTHROID) tablet 75 mcg  75 mcg Oral QAC breakfast Sid Falcon, MD   75 mcg at 07/04/18 0606  . loratadine (CLARITIN) tablet 10 mg  10 mg Oral Daily Gilles Chiquito B, MD   10 mg at 07/04/18 1050  . Melatonin TABS 3  mg  3 mg Oral QHS Gilles Chiquito B, MD   3 mg at 07/03/18 2101  . metoprolol tartrate (LOPRESSOR) tablet 25 mg  25 mg Oral BID Tawni Millers, MD   25 mg at 07/04/18 1050  . multivitamin with minerals tablet 1 tablet  1 tablet Oral Daily Samuella Cota, MD   1 tablet at 07/04/18 1050  . ondansetron (ZOFRAN) tablet 4 mg  4 mg Oral Q6H PRN Sid Falcon, MD       Or  . ondansetron [facility]) injection 4 mg  4 mg Intravenous Q6H PRN Gilles Chiquito B, MD      . polyethylene glycol (MIRALAX / GLYCOLAX) packet 17 g  17 g Oral Daily PRN Gilles Chiquito B, MD      . polyvinyl alcohol (LIQUIFILM TEARS) 1.4 % ophthalmic solution 1 drop  1 drop Both Eyes QID PRN Sid Falcon, MD      . traMADol Veatrice Bourbon) tablet 50 mg  50 mg Oral Q6H PRN Arrien, Jimmy Picket, MD   50 mg at 07/04/18 0533  . traZODone (DESYREL) tablet 50 mg  50 mg Oral QHS Sid Falcon, MD   50 mg at 07/03/18 2101     Discharge Medications: Please see discharge summary for a list of discharge medications.  Relevant Imaging Results:  Relevant Lab Results:   Additional Information SS#: [id number]  Candie Chroman, LCSW

## 2018-07-04 NOTE — Progress Notes (Signed)
Initial Nutrition Assessment  DOCUMENTATION CODES:   Not applicable  INTERVENTION:   -Ensure Enlive po BID, each supplement provides 350 kcal and 20 grams of protein -MVI with minerals daily -Magic Cup BID with meals, each supplement provides 290 kcals and 9 grams protein -Feeding assistance at meals  NUTRITION DIAGNOSIS:   Inadequate oral intake related to decreased appetite as evidenced by meal completion < 50%, per patient/family report.  GOAL:   Patient will meet greater than or equal to 90% of their needs  MONITOR:   PO intake, Supplement acceptance, Labs, Weight trends, Skin, I & O's  REASON FOR ASSESSMENT:   Consult Assessment of nutrition requirement/status  ASSESSMENT:   [name] [name] is a 84 y.o. female with medical history significant of Hypothyroidism, osteoporosis, HTN, Dementia, h/o endometrial cancer, CAD, arthritis,anemia, diverticulosis with colostomy in place who presents with not feeling well for 1 week.  She notes that she began to feel weak several days ago.  She also developed coughing.  Due to this she has had decreased interest in food and has not been eating well.  The only thing she wants is cold liquid.  She has had no sputum production. She has associated eye redness and drainage bilaterally.  She denies feer at home, chest pain palpitations, nausea, vomiting, abdominal pain, change in bowel or bladder habits, skin changes or rash.  She does report "feeling achy like I have mono."  Pt admitted with SIRS.   PTA pt resided a [facility]. Reviewed records from PTA; pt was not on any supplements PTA.   Spoke with pt, who was drowsy at time of visit. Observed pt breakfast tray- blueberry muffin and sausage patty unattempted. Pt was sipping on cup of orange juice prior to visit (of note, pt with lt hand amputation- noted prothesis in room). Pt shares that she generally has a good appetite, but will often skip breakfast. She estimates that she consumes "at  least half" of her meals at baseline. Noted meal completion 25-50%. Case discussed with physical therapy, who confirms that pt is usually more alert during the afternoon.   Pt reports that over the past few days PTA she had a poor appetite and food did not appeal to her. Pt denies any chewing or swallowing difficulties and has also received food items that she likes during hospitalization.  Reviewed wt hx; noted pt has experienced a 10.2% wt loss over the past 4 months, which is significant for time frame, however, unsure of wt accuracy. Verified bedscale wt of 83 kg. Pt unsure if she has lost weight- reports "maybe a couple of pounds".   Discussed importance of good meal and supplement intake to promote healing.   Labs reviewed: CBGS: [id number] (no inpatient orders for glycmeic control), K: 5.5.  NUTRITION - FOCUSED PHYSICAL EXAM:    Most Recent Value  Orbital Region  Mild depletion  Upper Arm Region  No depletion  Thoracic and Lumbar Region  No depletion  Buccal Region  No depletion  Temple Region  Mild depletion  Clavicle Bone Region  No depletion  Clavicle and Acromion Bone Region  No depletion  Scapular Bone Region  No depletion  Dorsal Hand  Mild depletion  Patellar Region  No depletion  Anterior Thigh Region  No depletion  Posterior Calf Region  No depletion  Edema (RD Assessment)  None  Hair  Reviewed  Eyes  Reviewed  Mouth  Reviewed  Skin  Reviewed  Nails  Reviewed  Diet Order:   Diet Order            Diet Heart Room service appropriate? Yes; Fluid consistency: Thin  Diet effective now              EDUCATION NEEDS:   Education needs have been addressed  Skin:  Skin Assessment: Reviewed RN Assessment  Last BM:  07/03/18  Height:   Ht Readings from Last 1 Encounters:  06/30/18 5\' 6"  (1.676 m)    Weight:   Wt Readings from Last 1 Encounters:  07/03/18 79 kg    Ideal Body Weight:  61.4 kg  BMI:  Body mass index is 28.11 kg/m.  Estimated  Nutritional Needs:   Kcal:  1550-1750  Protein:  70-85 grams  Fluid:  > 1.5 L    Alaira Level A. Jimmye Norman, RD, LDN, CDE Pager: [phone] After hours Pager: [phone]

## 2018-07-04 NOTE — Clinical Social Work Note (Signed)
Clinical Social Work Assessment  Patient Details  Name: [name] [name] MRN: [id number] Date of Birth: 1923/06/14  Date of referral:  07/04/18               Reason for consult:  Discharge Planning, Facility Placement                Permission sought to share information with:  Family Supports Permission granted to share information::  Yes, Verbal Permission Granted  Name::     [place]::  snf  Relationship::  niece Henrietta Dine)  Contact Information:  [phone]  Housing/Transportation Living arrangements for the past 2 months:  [place] of Information:    Patient Interpreter Needed:  None Criminal Activity/Legal Involvement Pertinent to Current Situation/Hospitalization:    Significant Relationships:  Other Family Members Lives with:  Facility Resident Do you feel safe going back to the place where you live?  Yes Need for family participation in patient care:  Yes (Comment)  Care giving concerns:  Patient is from an ALF ([facility]). Patients POA Beth and Beth's husband Yvone Neu) was outside patients room wanting to meet with CSW   Social Worker assessment / plan:  CSW met patients POA outside patients room. Beth stated that patient does not have children and she is the only family patient has. Beth stated that patient is from [facility] and patients baseline at facility is that she is able to transfer to and from her wheelchair. CSW stated at this time patient is needing mod assist with 2 physical assist. Eustaquio Maize stated that if patient is needing SNF before going back to ALF she is agreeable to send patient to rehab. Beth stated she would prefer [facility] since patient has been at facility in the past. CSW will follow up with patient and family once bed offers available    Employment status:  Retired Forensic scientist:  Medicare PT Recommendations:  [place] / Referral to community resources:  [place]  Patient/Family's Response to care:  Family appreciates CSW role in care  Patient/Family's Understanding of and Emotional Response to Diagnosis, Current Treatment, and Prognosis:  Family agreeable with patient going to rehab once discharge   Emotional Assessment Appearance:  Appears stated age Attitude/Demeanor/Rapport:  Unable to Assess Affect (typically observed):  Unable to Assess Orientation:  Oriented to Self, Oriented to Situation, Oriented to Place, Oriented to  Time Alcohol / Substance use:  Not Applicable Psych involvement (Current and /or in the community):  No (Comment)  Discharge Needs  Concerns to be addressed:  Care Coordination Readmission within the last 30 days:  No Current discharge risk:  Dependent with Mobility Barriers to Discharge:  Continued Medical Work up   [employer], LCSW 07/04/2018, 4:04 PM

## 2018-07-04 NOTE — Discharge Instructions (Signed)

## 2018-07-04 NOTE — Progress Notes (Addendum)
PROGRESS NOTE  [name] [name] EVO:[id number] DOB: [birth year] DOA: 06/30/2018 PCP: Lajean Manes, MD  Brief Narrative: 84 year old woman PMH hypothyroidism, dementia, diverticulosis, status post colostomy placement, presented with malaise, cough.  Found to have atrial fibrillation with rapid ventricular response and acute hypoxic respiratory failure.  Underwent cardioversion in the emergency department.   Assessment/Plan Acute on chronic diastolic CHF, secondary to rapid rate prior to admission.   -- Modest diuresis, not back to baseline from respiratory standpoint still hypoxic. --Will increase Lasix to twice daily  Acute hypoxic respiratory failure --Secondary to above.  Wean oxygen as tolerated.  Hyperkalemia, likely secondary to correction with potassium chloride.  Renal function stable. --No further potassium.  Repeat BMP in a.m.  Atrial fibrillation with rapid ventricular response, new dx, converted to sinus rhythm in the emergency department by electric cardioversion. --Remains in sinus rhythm.  Continue metoprolol. --Started on apixaban given CHA2DS2-VASc 5.  Demand ischemia secondary to atrial fibrillation with rapid ventricular response --Troponin trending down, EKG nonacute.  No further evaluation suggested.  SIRS admission with fever, tachycardia, leukocytosis.  Sepsis ruled out.  Chest x-ray no acute disease.  Urinalysis unremarkable.  Viral causes were considered given conjunctivitis and myalgias on admission. --No evidence of infection.  Antibiotics withheld.  Anion gap metabolic acidosis resolved, etiology unclear.  AKI on CKD stage III --AKI resolved.  Demand ischemia. --troponin trended down, EKG non-acute, no further evaluation suggested   Coronary artery disease, dementia, hypothyroidism   Not back to baseline.  Plan as above.  DVT prophylaxis: apixaban Code Status: DNR Family Communication: none Disposition Plan: ALF vs SNF    Murray Hodgkins, MD  [facility] Direct contact: [phone] --Via [place]  --[email or web address]; password [email or web address]  7PM-7AM contact night coverage as above 07/04/2018, 12:51 PM  LOS: 4 days   Consultants:    Procedures:  Echo Study Conclusions  - Left ventricle: The cavity size was normal. Wall thickness was   normal. Systolic function was normal. The estimated ejection   fraction was in the range of 60% to 65%. Doppler parameters are   consistent with abnormal left ventricular relaxation (grade 1   diastolic dysfunction). - Mitral valve: Calcified annulus. Mildly thickened leaflets .   There was mild regurgitation. - Left atrium: The atrium was mildly dilated. - Pericardium, extracardiac: A trivial pericardial effusion was   identified.  Antimicrobials:    Interval history/Subjective: Feels ok, coughing, breathing not back to baseline. No pain. Poor appetite.  Objective: Vitals:  Vitals:   07/04/18 1155 07/04/18 1231  BP:  (!) 153/71  Pulse: 78 72  Resp: 18 20  Temp:    SpO2: 96% 91%    Exam:  Constitutional:  . Appears calm, weak, uncomfortable, nontoxic ENMT:  . grossly normal hearing  Respiratory:  . Bilateral posterior inspiratory Rales, no wheezes or rhonchi. Marland Kitchen Respiratory effort mildly increased.  Significant coughing with deep breath. Cardiovascular:  . RRR, no m/r/g . Telemetry sinus rhythm . No LE extremity edema   Psychiatric:  . Mental status o Mood, affect appropriate  I have personally reviewed the following:   Data: . Renal function appears to be at baseline, creatinine 1.43.  Potassium 5.5.  BUN 34, increasing. . WBC trending down, 14.6.  Hemoglobin stable 10.5  Scheduled Meds: . apixaban  2.5 mg Oral BID  . feeding supplement (ENSURE ENLIVE)  237 mL Oral TID BM  . fluticasone  2 spray Each Nare Daily  . furosemide  40 mg Intravenous Q12H  .  guaiFENesin-dextromethorphan  10 mL Oral Q6H  . levothyroxine  75 mcg Oral QAC breakfast  . loratadine  10  mg Oral Daily  . Melatonin  3 mg Oral QHS  . metoprolol tartrate  25 mg Oral BID  . multivitamin with minerals  1 tablet Oral Daily  . traZODone  50 mg Oral QHS   Continuous Infusions: . sodium chloride 10 mL/hr at 06/30/18 1349    Principal Problem:   Acute on chronic diastolic CHF (congestive heart failure) (HCC) Active Problems:   HTN (hypertension)   Atrial fibrillation with rapid ventricular response (HCC)   Acute hypoxemic respiratory failure (HCC)   Hyperkalemia   Demand ischemia ([place])   LOS: 4 days

## 2018-07-05 LAB — GLUCOSE, CAPILLARY: GLUCOSE-CAPILLARY: 111 mg/dL — AB (ref 70–99)

## 2018-07-05 LAB — BASIC METABOLIC PANEL
Anion gap: 10 (ref 5–15)
BUN: 36 mg/dL — ABNORMAL HIGH (ref 8–23)
CHLORIDE: 94 mmol/L — AB (ref 98–111)
CO2: 31 mmol/L (ref 22–32)
Calcium: 8.5 mg/dL — ABNORMAL LOW (ref 8.9–10.3)
Creatinine, Ser: 1.4 mg/dL — ABNORMAL HIGH (ref 0.44–1.00)
GFR calc Af Amer: 36 mL/min — ABNORMAL LOW (ref >60)
GFR calc non Af Amer: 31 mL/min — ABNORMAL LOW (ref >60)
Glucose, Bld: 166 mg/dL — ABNORMAL HIGH (ref 70–99)
POTASSIUM: 3.4 mmol/L — AB (ref 3.5–5.1)
Sodium: 135 mmol/L (ref 135–145)

## 2018-07-05 LAB — CULTURE, BLOOD (ROUTINE X 2)
Culture: NO GROWTH
Culture: NO GROWTH
Special Requests: ADEQUATE
Special Requests: ADEQUATE

## 2018-07-05 LAB — PATHOLOGIST SMEAR REVIEW

## 2018-07-05 NOTE — Progress Notes (Addendum)
PROGRESS NOTE  [name] [name] ZOX:[id number] DOB: 04-21-1923 DOA: 06/30/2018 PCP: Lajean Manes, MD  Brief Narrative: 84 year old woman PMH hypothyroidism, dementia, diverticulosis, status post colostomy placement, presented with malaise, cough.  Found to have atrial fibrillation with rapid ventricular response and acute hypoxic respiratory failure.  Underwent cardioversion in the emergency department.   Assessment/Plan Acute on chronic diastolic CHF, secondary to rapid rate prior to admission. --No significant improvement symptomatically although urine output has improved.  Remains short of breath with rales on examination.   --Renal function stable.  We will continue diuresis and check BMP in a.m.    Acute hypoxic respiratory failure --Still requiring 3 L nasal cannula.  Wean as tolerated.  Treat CHF as above.  Hyperkalemia, likely secondary to correction with potassium chloride.  Renal function stable. --Resolved.  Atrial fibrillation with rapid ventricular response, new dx, converted to sinus rhythm in the emergency department by electric cardioversion. --Remains in sinus rhythm, continue metoprolol. --Continue apixaban given CHA2DS2-VASc 5.  Demand ischemia secondary to atrial fibrillation with rapid ventricular response --Troponin trended down, EKG nonacute.  No further evaluation suggested.  SIRS admission with fever, tachycardia, leukocytosis.  Sepsis ruled out.  Chest x-ray no acute disease.  Urinalysis unremarkable.  Viral causes were considered given conjunctivitis and myalgias on admission. --No evidence of infection.  Antibiotics withheld.  Anion gap metabolic acidosis resolved, etiology unclear.  AKI on CKD stage III --AKI resolved.  Coronary artery disease, dementia, hypothyroidism   Failing to improve.  Prognosis guarded.  Discussed with niece, power of attorney by telephone, she is interested in palliative care or even hospice if the patient fails to improve.  Will  involve palliative medicine here to assist with goals of care.  DVT prophylaxis: apixaban Code Status: DNR Family Communication: none Disposition Plan: ALF vs SNF    Murray Hodgkins, MD  [facility] Direct contact: [phone] --Via [place]  --[email or web address]; password [email or web address]  7PM-7AM contact night coverage as above 07/05/2018, 3:02 PM  LOS: 5 days   Consultants:    Procedures:  Echo Study Conclusions  - Left ventricle: The cavity size was normal. Wall thickness was   normal. Systolic function was normal. The estimated ejection   fraction was in the range of 60% to 65%. Doppler parameters are   consistent with abnormal left ventricular relaxation (grade 1   diastolic dysfunction). - Mitral valve: Calcified annulus. Mildly thickened leaflets .   There was mild regurgitation. - Left atrium: The atrium was mildly dilated. - Pericardium, extracardiac: A trivial pericardial effusion was   identified.  Antimicrobials:    Interval history/Subjective: No improvement, still short of breath, coughing.  Objective: Vitals:  Vitals:   07/05/18 0442 07/05/18 1325  BP:  135/62  Pulse: 91 82  Resp: (!) 39 20  Temp: 98.1 F (36.7 C) (!) 97.3 F (36.3 C)  SpO2: (!) 85% 95%    Exam: Constitutional:   . Appears calm, short of breath, uncomfortable ENMT:  . grossly normal hearing  Respiratory:  . Right-sided inspiratory crackles.  No rhonchi or wheezes. Marland Kitchen Respiratory effort mildly increased. Cardiovascular:  . RRR, no m/r/g . Telemetry sinus rhythm . No LE extremity edema   Psychiatric:  . Mental status o Mood, affect appropriate  I have personally reviewed the following:   Data: . Urine output 1300.  -1 L since admission.   Marland Kitchen BMP noted.  Potassium 3.4.  BUN and creatinine without significant change, 36 and 1.4 respectively.  Scheduled Meds: . apixaban  2.5  mg Oral BID  . feeding supplement (ENSURE ENLIVE)  237 mL Oral TID BM  . fluticasone  2  spray Each Nare Daily  . furosemide  40 mg Intravenous Q12H  . guaiFENesin-dextromethorphan  10 mL Oral Q6H  . levothyroxine  75 mcg Oral QAC breakfast  . loratadine  10 mg Oral Daily  . Melatonin  3 mg Oral QHS  . metoprolol tartrate  25 mg Oral BID  . multivitamin with minerals  1 tablet Oral Daily  . traZODone  50 mg Oral QHS   Continuous Infusions: . sodium chloride 10 mL/hr at 06/30/18 1349    Principal Problem:   Acute on chronic diastolic CHF (congestive heart failure) (HCC) Active Problems:   HTN (hypertension)   Atrial fibrillation with rapid ventricular response (HCC)   Acute hypoxemic respiratory failure (HCC)   Hyperkalemia   Demand ischemia ([place])   LOS: 5 days     Time spent 35 minutes >50% counseling, coordination of care

## 2018-07-05 NOTE — Progress Notes (Signed)
Physical Therapy Treatment Patient Details Name: [name] [name] MRN: [id number] DOB: November 10, 1922 Today's Date: 07/05/2018    History of Present Illness Pt presented with fatigue and weakness and found to have SIRS and acute on chronic heart failure. PMH - O2 dependent copd, dementia, colostomy, heart failure, endometrial CA, left prosthetic UE.      PT Comments    Pt admitted with above diagnosis. Pt currently with functional limitations due to balance and endurance deficits. Pt was able to pivot to chair with mod assist of 2 with pt unsteady on feet. Needed max cues and assist for mobility overall.   Will need SNF.  Desat to 88% on 3L with activity but recovers once settled in chair to 92% on 3L.  Other VSS. Pt will benefit from skilled PT to increase their independence and safety with mobility to allow discharge to the venue listed below.     Follow Up Recommendations  SNF;Supervision/Assistance - 24 hour(unless ALF can provide increased assist)     Equipment Recommendations  None recommended by PT    Recommendations for Other Services       Precautions / Restrictions Precautions Precautions: Fall Precaution Comments: left prosthetic UE  Restrictions Weight Bearing Restrictions: No    Mobility  Bed Mobility Overal bed mobility: Needs Assistance Bed Mobility: Supine to Sit     Supine to sit: Mod assist     General bed mobility comments: assist to bring legs off of bed, elevate trunk into sitting, and bring hips to EOB  Transfers Overall transfer level: Needs assistance Equipment used: 2 person hand held assist Transfers: Sit to/from Stand;Stand Pivot Transfers Sit to Stand: +2 physical assistance;Mod assist Stand pivot transfers: +2 physical assistance;Mod assist       General transfer comment: Assist to bring hips up and for balance. Small pivotal steps bed to 3N1.  Pt urinated.  Total assist to clean pt and then mod assist pivot to recliner.  Forward flexed posture.  Can't use RW due to prosthetic UE.    Ambulation/Gait                 Stairs             Wheelchair Mobility    Modified Rankin (Stroke Patients Only)       Balance Overall balance assessment: Needs assistance Sitting-balance support: Bilateral upper extremity supported;Feet supported Sitting balance-Leahy Scale: Poor Sitting balance - Comments: UE assist and min guard assist   Standing balance support: Bilateral upper extremity supported Standing balance-Leahy Scale: Zero Standing balance comment: +2 mod assist for static standing                            Cognition Arousal/Alertness: Awake/alert Behavior During Therapy: WFL for tasks assessed/performed Overall Cognitive Status: No family/caregiver present to determine baseline cognitive functioning                                 General Comments: Some dementia at baseline per chart      Exercises      General Comments        Pertinent Vitals/Pain Pain Assessment: Faces Faces Pain Scale: Hurts even more Pain Location: back Pain Descriptors / Indicators: Grimacing Pain Intervention(s): Limited activity within patient's tolerance;Monitored during session;Repositioned    Home Living  Prior Function            PT Goals (current goals can now be found in the care plan section) Acute Rehab PT Goals Patient Stated Goal: not stated Progress towards PT goals: Progressing toward goals    Frequency    Min 3X/week      PT Plan Current plan remains appropriate    Co-evaluation              AM-PAC PT "6 Clicks" Daily Activity  Outcome Measure  Difficulty turning over in bed (including adjusting bedclothes, sheets and blankets)?: Unable Difficulty moving from lying on back to sitting on the side of the bed? : Unable Difficulty sitting down on and standing up from a chair with arms (e.g., wheelchair, bedside commode, etc,.)?:  Unable Help needed moving to and from a bed to chair (including a wheelchair)?: A Lot Help needed walking in hospital room?: Total Help needed climbing 3-5 steps with a railing? : Total 6 Click Score: 7    End of Session Equipment Utilized During Treatment: Gait belt;Oxygen Activity Tolerance: Patient limited by fatigue;Patient limited by pain Patient left: in chair;with call bell/phone within reach;with chair alarm set Nurse Communication: Mobility status PT Visit Diagnosis: Other abnormalities of gait and mobility (R26.89);Muscle weakness (generalized) (M62.81);Pain Pain - Right/Left: (mid) Pain - part of body: (back)     Time: 7902-4097 PT Time Calculation (min) (ACUTE ONLY): 23 min  Charges:  $Therapeutic Activity: 23-37 mins                     [place] Pager:  [phone]  Office:  [place] 07/05/2018, 2:13 PM

## 2018-07-05 NOTE — Progress Notes (Signed)
Clinical Social Worker following patient for support and discharge needs. CSW received phone call from [facility] admission coordinator [place]. Per [facility] she will be able to make a bed offer on patient. CSW called and left voicemail on patients POA (niece) to make her aware patient has bed at [facility] when medically ready.   Rhea Pink, MSW,  [place]

## 2018-07-05 NOTE — Care Management Important Message (Signed)
Important Message  Patient Details  Name: [name] [name] MRN: [id number] Date of Birth: July 09, 1923   Medicare Important Message Given:  Yes    Ladawna Walgren P Zylie Mumaw 07/05/2018, 1:23 PM

## 2018-07-05 NOTE — Progress Notes (Signed)
Palliative care:  Consult received and chart reviewed. Left voicemail with patient's niece. Will attempt to setup meeting tomorrow.  Thank you for this consult.  Juel Burrow, DNP, AGNP-C Palliative Medicine Team Team Phone # [phone]  Pager # [phone]  NO CHARGE

## 2018-07-05 NOTE — Progress Notes (Addendum)
Late entry-  Came to room earlier to perform CPT/flutter valve.  Pt gave 1 attempt on flutter valve and began to cry, saying "I don't want to do it anymore" pt states she "hurts".  Treatment stopped.  RN aware

## 2018-07-06 DIAGNOSIS — Z7189 Other specified counseling: Secondary | ICD-10-CM

## 2018-07-06 DIAGNOSIS — Z515 Encounter for palliative care: Secondary | ICD-10-CM

## 2018-07-06 DIAGNOSIS — I5033 Acute on chronic diastolic (congestive) heart failure: Secondary | ICD-10-CM

## 2018-07-06 LAB — BASIC METABOLIC PANEL
Anion gap: 12 (ref 5–15)
BUN: 35 mg/dL — AB (ref 8–23)
CHLORIDE: 92 mmol/L — AB (ref 98–111)
CO2: 33 mmol/L — ABNORMAL HIGH (ref 22–32)
CREATININE: 1.43 mg/dL — AB (ref 0.44–1.00)
Calcium: 8.4 mg/dL — ABNORMAL LOW (ref 8.9–10.3)
GFR calc Af Amer: 35 mL/min — ABNORMAL LOW (ref >60)
GFR, EST NON AFRICAN AMERICAN: 31 mL/min — AB (ref >60)
GLUCOSE: 114 mg/dL — AB (ref 70–99)
POTASSIUM: 3.5 mmol/L (ref 3.5–5.1)
Sodium: 137 mmol/L (ref 135–145)

## 2018-07-06 LAB — GLUCOSE, CAPILLARY: Glucose-Capillary: 111 mg/dL — ABNORMAL HIGH (ref 70–99)

## 2018-07-06 NOTE — Consult Note (Signed)
   [facility] CM Inpatient Consult   07/06/2018  [name] [name] Nov 28, 1922 [id number]    Patient screened for potential [facility] Care Management services due to unplanned readmission risk score of 25% (high).  Spoke with inpatient RNCM. Palliative consult pending. Inpatient LCSW following for possible SNF placement.   No identifiable [facility] Care Management needs noted at this time.   Marthenia Rolling, MSN-Ed, RN,BSN [facility] Liaison [phone]

## 2018-07-06 NOTE — Progress Notes (Signed)
CSW spoke to patient's niece on the phone and confirmed plan for SNF with palliative services following. Confirmed patient's bed at [facility]. [facility] can accept patient over the weekend if medically ready. CSW to follow.  Estanislado Emms, [place]

## 2018-07-06 NOTE — Consult Note (Addendum)
Consultation Note Date: 07/06/2018   Patient Name: [name] [name]  DOB: 1923/08/22  MRN: [id number]  Age / Sex: 84 y.o., female  PCP: Lajean Manes, MD Referring Physician: Samuella Cota, MD  Reason for Consultation: Establishing goals of care  HPI/Patient Profile: 84 y.o. female  with past medical history of hypothyroidism, CKD 3, osteoporosis, HTN, endometrial cancer, CAD, arthritis, anemia, dementia, diverticulosis, and colostomy admitted on 06/30/2018 with malaise and cough. Found to have a fib RVR and acute respiratory failure. She had a cardioversion in the ED and has remained in NSR since. Also found to have acute on chronic diastolic heart failure with no significant improvement in symptoms since admission.  PMT consulted for [place].  Clinical Assessment and Goals of Care: I have reviewed medical records including EPIC notes, labs and imaging, assessed the patient and then met with patient's niece/HCPOA, Beth, nephew, and nephew's wife, to discuss diagnosis prognosis, GOC, EOL wishes, disposition and options.  I introduced Palliative Medicine as specialized medical care for people living with serious illness. It focuses on providing relief from the symptoms and stress of a serious illness. The goal is to improve quality of life for both the patient and the family.  We discussed a brief life review of the patient. She is a retired Education officer, museum. Never married. No children. Very close to her niece and nephew. They describe her as independent and funny.   As far as functional and nutritional status, they tell me prior to admission she was wheelchair bound but independent in most ADLs except bathing. She could transfer herself to her wheelchair and wheel herself to dining area. They report a dwindling appetite, but "enough".    We discussed her current illness and what it means in the larger context of her on-going co-morbidities.  Natural disease  trajectory and expectations at EOL were discussed. Specifically, we discussed her heart failure and respiratory failure. We discussed lack of improvement despite optimized treatment.   Family shares how patient has declined since hospitalizations - spending more time asleep than usual, not as interactive, and eating less.   I attempted to elicit values and goals of care important to the patient.  They tell me that it is important to her to not be in the hospital and focus on quality of life over quantity.   The difference between aggressive medical intervention and comfort care was considered in light of the patient's goals of care.   We thoroughly discussed different options for Ms. Golz moving forward in her care. Specifically discussed hospice facility, hospice at home, hospice at ALF, hospice at SNF, and SNF with rehab. After all questions and concerns addressed, family decides they would like to attempt SNF rehab with palliative to follow. They are aware patient may not do well with this and if she does not would like to transition to hospice care.   Advance directives, concepts specific to code status, artifical feeding and hydration, and rehospitalization were considered and discussed. They set limits for Ms. Tauzin's care by filling out a MOST form - selected DNR, NO further hospitalizations, focus on comfort, antibiotics if indicated, no IV fluids, and no feeding tube.   Hospice and Palliative Care services outpatient were explained and offered. They ask for palliative care to follow at facility and are aware of when hospice care would be appropriate.   Questions and concerns were addressed. The family was encouraged to call with questions or concerns.   Primary Decision Maker HCPOA - niece Beth Corzine  SUMMARY OF RECOMMENDATIONS   - ** Discharging MD please write for palliative consult at SNF in discharge summary - MOST completed - DNR, NO further hospitalizations - discussed when  hospice care would be appropriate - patient eligible for hospice now and family would agree but fear she would not have enough support to return to ALF with hospice so they will try SNF rehab first  Code Status/Advance Care Planning:  DNR  Symptom Management:   Patient with no complaints during my assessment, primary has ordered tramadol  Palliative Prophylaxis:   Aspiration, Bowel Regimen, Frequent Pain Assessment, Oral Care and Turn Reposition  Additional Recommendations (Limitations, Scope, Preferences):  Avoid Hospitalization and No Artificial Feeding  Psycho-social/Spiritual:   Desire for further Chaplaincy support:no  Additional Recommendations: Education on Hospice  Prognosis:   < 6 months  Discharge Planning: [place] for rehab with Palliative care service follow-up      Primary Diagnoses: Present on Admission: . Atrial fibrillation with rapid ventricular response ([place]) . (Resolved) Sinus tachycardia . HTN (hypertension) . (Resolved) SIRS (systemic inflammatory response syndrome) (HCC)   I have reviewed the medical record, interviewed the patient and family, and examined the patient. The following aspects are pertinent.  Past Medical History:  Diagnosis Date  . Anemia   . Arthritis   . CAD (coronary artery disease)   . Cancer [facility])    Endometrial  . Congenital disease    Left forearm  . Dementia ([place])   . Diverticulosis   . History of degenerative disc disease    Both knees  . Hypertension   . Osteoporosis    osteopenia  . Thyroid disease    Hypothyroidism   Social History   Socioeconomic History  . Marital status: Single    Spouse name: Not on file  . Number of children: Not on file  . Years of education: Not on file  . Highest education level: Not on file  Occupational History  . Not on file  Social Needs  . Financial resource strain: Not on file  . Food insecurity:    Worry: Not on file    Inability: Not on file  .  Transportation needs:    Medical: Not on file    Non-medical: Not on file  Tobacco Use  . Smoking status: Former Research scientist (life sciences)  . Smokeless tobacco: Never Used  Substance and Sexual Activity  . Alcohol use: No  . Drug use: No  . Sexual activity: Never  Lifestyle  . Physical activity:    Days per week: Not on file    Minutes per session: Not on file  . Stress: Not on file  Relationships  . Social connections:    Talks on phone: Not on file    Gets together: Not on file    Attends religious service: Not on file    Active member of club or organization: Not on file    Attends meetings of clubs or organizations: Not on file    Relationship status: Not on file  Other Topics Concern  . Not on file  Social History Narrative  . Not on file   Family History  Problem Relation Age of Onset  . Breast cancer Sister   . Heart disease Mother   . Heart attack Father    Scheduled Meds: . apixaban  2.5 mg Oral BID  . feeding supplement (ENSURE ENLIVE)  237 mL Oral TID BM  . fluticasone  2 spray Each Nare Daily  . furosemide  40 mg Intravenous Q12H  . guaiFENesin-dextromethorphan  10 mL Oral Q6H  . levothyroxine  75 mcg Oral QAC breakfast  . loratadine  10 mg Oral Daily  . Melatonin  3 mg Oral QHS  . metoprolol tartrate  25 mg Oral BID  . multivitamin with minerals  1 tablet Oral Daily  . traZODone  50 mg Oral QHS   Continuous Infusions: . sodium chloride 10 mL/hr at 06/30/18 1349   PRN Meds:.acetaminophen **OR** acetaminophen, albuterol, benzonatate, hydrocerin, ondansetron **OR** ondansetron (ZOFRAN) IV, polyethylene glycol, polyvinyl alcohol, traMADol Allergies  Allergen Reactions  . Sulfa Antibiotics Hives  . Penicillins Itching and Rash    Tolerates ceftriaxone and cefdinir  Has patient had a PCN reaction causing immediate rash, facial/tongue/throat swelling, SOB or lightheadedness with hypotension: Yes Has patient had a PCN reaction causing severe rash involving mucus membranes  or skin necrosis: Unk Has patient had a PCN reaction that required hospitalization: Unk Has patient had a PCN reaction occurring within the last 10 years: Unk If all of the above answers are "NO", then may proceed with Cephalosporin use.    Review of Systems  Unable to perform ROS: Dementia    Physical Exam  Constitutional: She appears lethargic. She appears ill. No distress.  HENT:  Head: Normocephalic and atraumatic.  Cardiovascular: Normal rate, regular rhythm and intact distal pulses.  Pulmonary/Chest: Effort normal. No accessory muscle usage. No tachypnea. No respiratory distress.  Abdominal: Soft. Bowel sounds are normal.  Musculoskeletal:       Right lower leg: She exhibits no edema.       Left lower leg: She exhibits no edema.  Neurological: She appears lethargic.  Oriented but lacks decision making ability  Skin: Skin is warm and dry.  Psychiatric: Cognition and memory are impaired.    Vital Signs: BP (!) 151/58 (BP Location: Right Arm)   Pulse 79   Temp 98.5 F (36.9 C) (Oral)   Resp (!) 25   Ht _0  (1.676 m)   Wt 79 kg   SpO2 90%   BMI 28.11 kg/m  Pain Scale: 0-10   Pain Score: Asleep   SpO2: SpO2: 90 % O2 Device:SpO2: 90 % O2 Flow Rate: .O2 Flow Rate (L/min): 1 L/min  IO: Intake/output summary:   Intake/Output Summary (Last 24 hours) at 07/06/2018 1220 Last data filed at 07/06/2018 0900 Gross per 24 hour  Intake 480 ml  Output 400 ml  Net 80 ml    LBM: Last BM Date: 07/04/18 Baseline Weight: Weight: 85.7 kg Most recent weight: Weight: 79 kg     Palliative Assessment/Data: PPS 40%   Time in: 13:00 Time out: 14:50 Time Total: 110 minutes Greater than 50%  of this time was spent counseling and coordinating care related to the above assessment and plan.  Juel Burrow, DNP, AGNP-C Palliative Medicine Team [phone] Pager: [phone]

## 2018-07-06 NOTE — Progress Notes (Addendum)
PROGRESS NOTE  [name] [name] JKK:[id number] DOB: 1923-03-06 DOA: 06/30/2018 PCP: Lajean Manes, MD  Brief Narrative: 84 year old woman PMH hypothyroidism, dementia, diverticulosis, status post colostomy placement, presented with malaise, cough.  Found to have atrial fibrillation with rapid ventricular response and acute hypoxic respiratory failure.  Underwent cardioversion in the emergency department.   Assessment/Plan Acute on chronic diastolic CHF, secondary to rapid rate prior to admission.  --Renal function remains stable.  Question whether I/O are accurate. --will continue IV diuresis, strict I/O  Acute hypoxic respiratory failure --Currently on 2 L nasal cannula, will continue to wean oxygen as tolerated.  Hyperkalemia, likely secondary to correction with potassium chloride.  Renal function stable. --Resolved.  Atrial fibrillation with rapid ventricular response, new dx, converted to sinus rhythm in the emergency department by electric cardioversion. --Remains in sinus rhythm, continue metoprolol. --Continue apixaban given CHA2DS2-VASc 5.  Demand ischemia secondary to atrial fibrillation with rapid ventricular response --Troponin trended down, EKG nonacute.  No further evaluation suggested.  SIRS admission with fever, tachycardia, leukocytosis.  Sepsis ruled out.  Chest x-ray no acute disease.  Urinalysis unremarkable.  Viral causes were considered given conjunctivitis and myalgias on admission. --No evidence of infection.  Antibiotics withheld.  Anion gap metabolic acidosis resolved, etiology unclear.  AKI on CKD stage III --AKI resolved.  Coronary artery disease, dementia, hypothyroidism   Some improvement; will continue current management and discuss with family. Appreciate PMT.  DVT prophylaxis: apixaban Code Status: DNR Family Communication: discussed with niece/HCPOA by telephone Disposition Plan: SNF with palliative consult at [facility]   Murray Hodgkins, MD  [facility] Direct contact: [phone] --Via [place]  --[email or web address]; password [email or web address]  7PM-7AM contact night coverage as above 07/06/2018, 8:38 AM  LOS: 6 days   Consultants:    Procedures:  Echo Study Conclusions  - Left ventricle: The cavity size was normal. Wall thickness was   normal. Systolic function was normal. The estimated ejection   fraction was in the range of 60% to 65%. Doppler parameters are   consistent with abnormal left ventricular relaxation (grade 1   diastolic dysfunction). - Mitral valve: Calcified annulus. Mildly thickened leaflets .   There was mild regurgitation. - Left atrium: The atrium was mildly dilated. - Pericardium, extracardiac: A trivial pericardial effusion was   identified.  Antimicrobials:    Interval history/Subjective: Feels better today.  Breathing better.  Objective: Vitals:  Vitals:   07/06/18 0442 07/06/18 0822  BP: 129/60 (!) 151/58  Pulse: 72 79  Resp: (!) 21 (!) 25  Temp: 98.5 F (36.9 C)   SpO2: 96% 90%    Exam: Constitutional:   . Appears calm, better today, comfortable ENMT:  . grossly normal hearing  Respiratory:  . Right-sided inspiratory crackles.  Fair air movement.  No wheezes or rhonchi. Marland Kitchen Respiratory effort normal.  Cardiovascular:  . RRR, no m/r/g . No LE extremity edema   Psychiatric:  . Mental status o Mood, affect appropriate  I have personally reviewed the following:   Data: . Urine output 400.  -934 since admission? . CBG stable . Potassium 3.5, BUN and creatinine stable at 35, 1.43.  Scheduled Meds: . apixaban  2.5 mg Oral BID  . feeding supplement (ENSURE ENLIVE)  237 mL Oral TID BM  . fluticasone  2 spray Each Nare Daily  . furosemide  40 mg Intravenous Q12H  . guaiFENesin-dextromethorphan  10 mL Oral Q6H  . levothyroxine  75 mcg Oral QAC breakfast  . loratadine  10 mg Oral  Daily  . Melatonin  3 mg Oral QHS  . metoprolol tartrate  25 mg Oral BID  . multivitamin with  minerals  1 tablet Oral Daily  . traZODone  50 mg Oral QHS   Continuous Infusions: . sodium chloride 10 mL/hr at 06/30/18 1349    Principal Problem:   Acute on chronic diastolic CHF (congestive heart failure) (HCC) Active Problems:   HTN (hypertension)   Atrial fibrillation with rapid ventricular response (HCC)   Acute hypoxemic respiratory failure (HCC)   Hyperkalemia   Demand ischemia ([place])   LOS: 6 days

## 2018-07-07 DIAGNOSIS — J849 Interstitial pulmonary disease, unspecified: Secondary | ICD-10-CM

## 2018-07-07 LAB — GLUCOSE, CAPILLARY: Glucose-Capillary: 144 mg/dL — ABNORMAL HIGH (ref 70–99)

## 2018-07-07 MED ORDER — FUROSEMIDE 10 MG/ML IJ SOLN
60.0000 mg | Freq: Once | INTRAMUSCULAR | Status: AC
  Administered 2018-07-07: 60 mg via INTRAVENOUS
  Filled 2018-07-07: qty 6

## 2018-07-07 MED ORDER — FUROSEMIDE 10 MG/ML IJ SOLN
40.0000 mg | Freq: Once | INTRAMUSCULAR | Status: AC
  Administered 2018-07-07: 40 mg via INTRAVENOUS
  Filled 2018-07-07: qty 4

## 2018-07-07 NOTE — Progress Notes (Signed)
PROGRESS NOTE  [name] [name] XNA:[id number] DOB: 04/30/1923 DOA: 06/30/2018 PCP: Lajean Manes, MD  Brief Narrative: 84 year old woman PMH hypothyroidism, dementia, diverticulosis, status post colostomy placement, presented with malaise, cough.  Found to have atrial fibrillation with rapid ventricular response and acute hypoxic respiratory failure.  Underwent cardioversion in the emergency department.   Assessment/Plan Acute on chronic diastolic CHF, secondary to rapid rate prior to admission.  --Still with some rales and SOB --will increase furosemide dose today  Acute hypoxic respiratory failure, complicated by suspected ILD --Remains stable on 2 L nasal cannula.  Given suspected underlying interstitial lung disease, doubt will be able to wean off.  Hyperkalemia, likely secondary to correction with potassium chloride.  Renal function stable. --Resolved.  Atrial fibrillation with rapid ventricular response, new dx, converted to sinus rhythm in the emergency department by electric cardioversion. CHA2DS2-VASc 5. --Remains in sinus rhythm.  Continue metoprolol.  Continue apixaban.    Demand ischemia secondary to atrial fibrillation with rapid ventricular response --Troponin trended down, EKG nonacute.  No further evaluation suggested.  SIRS admission with fever, tachycardia, leukocytosis.  Sepsis ruled out.  Chest x-ray no acute disease.  Urinalysis unremarkable.  Viral causes were considered given conjunctivitis and myalgias on admission. --No evidence of infection.  Antibiotics withheld.  Anion gap metabolic acidosis resolved, etiology unclear.  AKI on CKD stage III --AKI resolved.  Coronary artery disease, dementia, hypothyroidism   Appears to be plateauing, not sure any further improvement can be expected, will increase Lasix today and reevaluate tomorrow.  If no worsening of condition, would anticipate transfer to skilled nursing facility.  DVT prophylaxis: apixaban Code Status:  DNR Family Communication:  Disposition Plan: SNF with palliative consult at [facility]   Murray Hodgkins, MD  [facility] Direct contact: [phone] --Via [place]  --[email or web address]; password [email or web address]  7PM-7AM contact night coverage as above 07/07/2018, 8:57 AM  LOS: 7 days   Consultants:    Procedures:  Echo Study Conclusions  - Left ventricle: The cavity size was normal. Wall thickness was   normal. Systolic function was normal. The estimated ejection   fraction was in the range of 60% to 65%. Doppler parameters are   consistent with abnormal left ventricular relaxation (grade 1   diastolic dysfunction). - Mitral valve: Calcified annulus. Mildly thickened leaflets .   There was mild regurgitation. - Left atrium: The atrium was mildly dilated. - Pericardium, extracardiac: A trivial pericardial effusion was   identified.  Antimicrobials:    Interval history/Subjective: Feels ok, some back pain, feels SOB  Objective: Vitals:  Vitals:   07/07/18 0453 07/07/18 0830  BP: (!) 147/53   Pulse: 82   Resp: (!) 23 (!) 28  Temp: 98.6 F (37 C)   SpO2: 93%     Exam: Constitutional:   . Appears calm, uncomfortable, mildly SOB Respiratory:  . Bilateral rales, no w/rhonchi. . Mild increased resp effort Cardiovascular:  . RRR, no m/r/g . Telemetry SR . Trace BLE extremity edema   Psychiatric:  . Mental status o Mood, affect appropriate  I have personally reviewed the following:   Data: . Urine output 800.  -1364 since admission  . CBG remains stable  Scheduled Meds: . apixaban  2.5 mg Oral BID  . feeding supplement (ENSURE ENLIVE)  237 mL Oral TID BM  . fluticasone  2 spray Each Nare Daily  . furosemide  40 mg Intravenous Q12H  . guaiFENesin-dextromethorphan  10 mL Oral Q6H  . levothyroxine  75 mcg Oral QAC breakfast  .  loratadine  10 mg Oral Daily  . Melatonin  3 mg Oral QHS  . metoprolol tartrate  25 mg Oral BID  . multivitamin with minerals  1  tablet Oral Daily  . traZODone  50 mg Oral QHS   Continuous Infusions: . sodium chloride 10 mL/hr at 06/30/18 1349    Principal Problem:   Acute on chronic diastolic CHF (congestive heart failure) (HCC) Active Problems:   HTN (hypertension)   Atrial fibrillation with rapid ventricular response (HCC)   Acute hypoxemic respiratory failure (HCC)   Hyperkalemia   Demand ischemia (HCC)   Goals of care, counseling/discussion   Palliative care by specialist   LOS: 7 days

## 2018-07-07 NOTE — Progress Notes (Signed)
Flutter makes pt cough excessively.  Encouraged deep breathing and coughing.  Cough was dry.

## 2018-07-08 DIAGNOSIS — I5033 Acute on chronic diastolic (congestive) heart failure: Secondary | ICD-10-CM | POA: Diagnosis not present

## 2018-07-08 DIAGNOSIS — M6281 Muscle weakness (generalized): Secondary | ICD-10-CM | POA: Diagnosis not present

## 2018-07-08 DIAGNOSIS — I4891 Unspecified atrial fibrillation: Secondary | ICD-10-CM | POA: Diagnosis not present

## 2018-07-08 DIAGNOSIS — N189 Chronic kidney disease, unspecified: Secondary | ICD-10-CM | POA: Diagnosis not present

## 2018-07-08 DIAGNOSIS — J9601 Acute respiratory failure with hypoxia: Secondary | ICD-10-CM | POA: Diagnosis not present

## 2018-07-08 DIAGNOSIS — G4700 Insomnia, unspecified: Secondary | ICD-10-CM | POA: Diagnosis not present

## 2018-07-08 DIAGNOSIS — R070 Pain in throat: Secondary | ICD-10-CM | POA: Diagnosis not present

## 2018-07-08 DIAGNOSIS — N183 Chronic kidney disease, stage 3 (moderate): Secondary | ICD-10-CM | POA: Diagnosis not present

## 2018-07-08 DIAGNOSIS — R05 Cough: Secondary | ICD-10-CM | POA: Diagnosis not present

## 2018-07-08 DIAGNOSIS — J9600 Acute respiratory failure, unspecified whether with hypoxia or hypercapnia: Secondary | ICD-10-CM | POA: Diagnosis not present

## 2018-07-08 DIAGNOSIS — E569 Vitamin deficiency, unspecified: Secondary | ICD-10-CM | POA: Diagnosis not present

## 2018-07-08 DIAGNOSIS — E871 Hypo-osmolality and hyponatremia: Secondary | ICD-10-CM | POA: Diagnosis not present

## 2018-07-08 DIAGNOSIS — I10 Essential (primary) hypertension: Secondary | ICD-10-CM | POA: Diagnosis not present

## 2018-07-08 DIAGNOSIS — R509 Fever, unspecified: Secondary | ICD-10-CM | POA: Diagnosis not present

## 2018-07-08 DIAGNOSIS — Z7401 Bed confinement status: Secondary | ICD-10-CM | POA: Diagnosis not present

## 2018-07-08 DIAGNOSIS — R41841 Cognitive communication deficit: Secondary | ICD-10-CM | POA: Diagnosis not present

## 2018-07-08 DIAGNOSIS — J449 Chronic obstructive pulmonary disease, unspecified: Secondary | ICD-10-CM | POA: Diagnosis not present

## 2018-07-08 DIAGNOSIS — M40293 Other kyphosis, cervicothoracic region: Secondary | ICD-10-CM | POA: Diagnosis not present

## 2018-07-08 DIAGNOSIS — R278 Other lack of coordination: Secondary | ICD-10-CM | POA: Diagnosis not present

## 2018-07-08 DIAGNOSIS — R52 Pain, unspecified: Secondary | ICD-10-CM | POA: Diagnosis not present

## 2018-07-08 DIAGNOSIS — J309 Allergic rhinitis, unspecified: Secondary | ICD-10-CM | POA: Diagnosis not present

## 2018-07-08 DIAGNOSIS — R6510 Systemic inflammatory response syndrome (SIRS) of non-infectious origin without acute organ dysfunction: Secondary | ICD-10-CM | POA: Diagnosis not present

## 2018-07-08 DIAGNOSIS — Z933 Colostomy status: Secondary | ICD-10-CM | POA: Diagnosis not present

## 2018-07-08 DIAGNOSIS — E039 Hypothyroidism, unspecified: Secondary | ICD-10-CM | POA: Diagnosis not present

## 2018-07-08 DIAGNOSIS — F0150 Vascular dementia without behavioral disturbance: Secondary | ICD-10-CM | POA: Diagnosis not present

## 2018-07-08 DIAGNOSIS — Z119 Encounter for screening for infectious and parasitic diseases, unspecified: Secondary | ICD-10-CM | POA: Diagnosis not present

## 2018-07-08 DIAGNOSIS — E440 Moderate protein-calorie malnutrition: Secondary | ICD-10-CM | POA: Diagnosis not present

## 2018-07-08 DIAGNOSIS — L309 Dermatitis, unspecified: Secondary | ICD-10-CM | POA: Diagnosis not present

## 2018-07-08 DIAGNOSIS — A78 Q fever: Secondary | ICD-10-CM | POA: Diagnosis not present

## 2018-07-08 DIAGNOSIS — N390 Urinary tract infection, site not specified: Secondary | ICD-10-CM | POA: Diagnosis not present

## 2018-07-08 DIAGNOSIS — H04129 Dry eye syndrome of unspecified lacrimal gland: Secondary | ICD-10-CM | POA: Diagnosis not present

## 2018-07-08 DIAGNOSIS — E876 Hypokalemia: Secondary | ICD-10-CM | POA: Diagnosis not present

## 2018-07-08 DIAGNOSIS — F0390 Unspecified dementia without behavioral disturbance: Secondary | ICD-10-CM | POA: Diagnosis not present

## 2018-07-08 DIAGNOSIS — M2550 Pain in unspecified joint: Secondary | ICD-10-CM | POA: Diagnosis not present

## 2018-07-08 LAB — GLUCOSE, CAPILLARY: Glucose-Capillary: 128 mg/dL — ABNORMAL HIGH (ref 70–99)

## 2018-07-08 MED ORDER — ADULT MULTIVITAMIN W/MINERALS CH: 1.0000 | ORAL_TABLET | Freq: Every day | ORAL | Status: AC

## 2018-07-08 MED ORDER — APIXABAN 2.5 MG PO TABS: 2.5000 mg | ORAL_TABLET | Freq: Two times a day (BID) | ORAL | Status: AC

## 2018-07-08 MED ORDER — METOPROLOL TARTRATE 25 MG PO TABS: 25.0000 mg | ORAL_TABLET | Freq: Two times a day (BID) | ORAL | Status: DC

## 2018-07-08 MED ORDER — TRAMADOL HCL 50 MG PO TABS: 50.0000 mg | ORAL_TABLET | Freq: Two times a day (BID) | ORAL | 0 refills | Status: DC | PRN

## 2018-07-08 MED ORDER — ENSURE ENLIVE PO LIQD: 237.0000 mL | Freq: Three times a day (TID) | ORAL | 12 refills | Status: AC

## 2018-07-08 NOTE — Progress Notes (Signed)
Clinical Social Worker facilitated patient discharge including contacting patient family and facility to confirm patient discharge plans. Clinical information faxed to facility and family agreeable with plan.  CSW arranged ambulance transport via [facility] to [facility].  RN to call for report prior to discharge [phone] Rm: 610A.  Clinical Social Worker will sign off for now as social work intervention is no longer needed. Please consult [place] again if new need arises.  [name] Ohatchee LCSW [phone]  [phone]

## 2018-07-08 NOTE — Progress Notes (Signed)
Pt discharged with transport by [facility] to [facility]  Left hospital en route at 1245.

## 2018-07-08 NOTE — Care Management Note (Signed)
Case Management Note  Patient Details  Name: [name] [name] MRN: [id number] Date of Birth: Feb 05, 1923  Subjective/Objective:                    Action/Plan:  DC to SNF. Please contact CSW for any assistance facilitating DC.   Expected Discharge Date:  07/08/18               Expected Discharge Plan:  Assisted Living / Rest Home  In-House Referral:  Clinical Social Work, Hospice / Palliative Care  Discharge planning Services  CM Consult  Post Acute Care Choice:    Choice offered to:     DME Arranged:    DME Agency:     HH Arranged:    [place] Agency:     Status of Service:  Completed, signed off  If discussed at [employer], dates discussed:    Additional Comments:  Carles Collet, RN 07/08/2018, 11:44 AM

## 2018-07-08 NOTE — Discharge Summary (Signed)
Physician Discharge Summary  [name] [name] EQA:[id number] DOB: May 19, 1923 DOA: 06/30/2018  PCP: Lajean Manes, MD  Admit date: 06/30/2018 Discharge date: 07/08/2018  Recommendations for Outpatient Follow-up:   Acute on chronic diastolic CHF, secondary to rapid rate prior to admission.  --Appears to be euvolemic at this point.  Suspect rales are reflective of interstitial lung disease.  Acute hypoxic respiratory failure, complicated by suspected ILD --Remains stable on 2 L nasal cannula.  Given underlying interstitial lung disease, doubt will be able to wean off.     Atrial fibrillation with rapid ventricular response, new dx. CHA2DS2-VASc 5. --In sinus rhythm.  Continue metoprolol and apixaban.    Contact information for follow-up providers    Stoneking, Hal, MD Follow up in 2 week(s).   Specialty:  Internal Medicine Contact information: [address]  [place] [address] [phone]            Contact information for after-discharge care    Destination    [facility] Preferred SNF .   Service:  Skilled Nursing Contact information: [address] [place] [phone]                   Discharge Diagnoses:  1. Acute on chronic diastolic CHF, secondary to rapid rate prior to admission.  2. Acute hypoxic respiratory failure, complicated by suspected ILD 3. Hyperkalemia 4. Atrial fibrillation with rapid ventricular response, new dx, Demand ischemia secondary to atrial fibrillation with rapid ventricular response 5. SIRS admission with fever, tachycardia, leukocytosis.  6. Anion gap metabolic acidosis  7. AKI on CKD stage III  Discharge Condition: improved Disposition: SNF  Diet recommendation: heart healthy  Filed Weights   07/02/18 0351 07/03/18 0355 07/08/18 0404  Weight: 83.2 kg 79 kg 77.1 kg    History of present illness:  84 year old woman PMH hypothyroidism, dementia, diverticulosis, status post  colostomy placement, presented with malaise, cough.  Found to have atrial fibrillation with rapid ventricular response and acute hypoxic respiratory failure. Underwent cardioversion in the emergency department.   Hospital Course:  Patient was treated with IV diuretics with only gradual clinical improvement, discharge remains hypoxic but stable.  Suspect underlying component of interstitial lung disease.  Heart remained in sinus rhythm status post cardioversion and CKD remains stable.  Patient is very slow to respond to diuretics and appears to have achieved maximum hospital benefit.  She was seen by palliative care, extensive discussions were discussed with the patient's healthcare power of attorney and niece.  Patient has been declining and her prognosis remains guarded.  Plan is for transfer to skilled nursing facility with palliative care to follow as an outpatient.  Acute on chronic diastolic CHF, secondary to rapid rate prior to admission.  --Appears to be euvolemic at this point.  Suspect Rales are reflective of interstitial lung disease.  Acute hypoxic respiratory failure, complicated by suspected ILD --Remains stable on 2 L nasal cannula.  Given underlying interstitial lung disease, doubt will be able to wean off.    Hyperkalemia, likely secondary to correction with potassium chloride.  Renal function stable. --Resolved.  Atrial fibrillation with rapid ventricular response, new dx, converted to sinus rhythm in the emergency department by electric cardioversion. CHA2DS2-VASc 5.  TSH within normal limits June 2019. --In sinus rhythm.  Continue metoprolol and apixaban.  Demand ischemia secondary to atrial fibrillation with rapid ventricular response --Troponin trended down, EKG nonacute.  No further evaluation suggested.  SIRS admission with fever, tachycardia, leukocytosis.  Sepsis ruled out.  Chest x-ray no acute disease.  Urinalysis unremarkable.  Viral causes were considered given  conjunctivitis and myalgias on admission. --No evidence of infection.  Antibiotics withheld.  Anion gap metabolic acidosis resolved, etiology unclear.  AKI on CKD stage III --AKI resolved.  Consultants:  PMT  Procedures:  Echo Study Conclusions  - Left ventricle: The cavity size was normal. Wall thickness was normal. Systolic function was normal. The estimated ejection fraction was in the range of 60% to 65%. Doppler parameters are consistent with abnormal left ventricular relaxation (grade 1 diastolic dysfunction). - Mitral valve: Calcified annulus. Mildly thickened leaflets . There was mild regurgitation. - Left atrium: The atrium was mildly dilated. - Pericardium, extracardiac: A trivial pericardial effusion was identified.  Today's assessment: S: feels ok, some cough but breathing fine. O: Vitals:  Vitals:   07/08/18 0800 07/08/18 0830  BP: (!) 151/59   Pulse: 78 85  Resp: (!) 23 (!) 27  Temp: 98.2 F (36.8 C)   SpO2: 95% 96%    Constitutional:  . Appears calm and comfortable Respiratory:  . Inspiratory crackles on right; not on left; no wheezes/rhonchi . Respiratory effort normal.   Cardiovascular:  . RRR, no m/r/g . Telemetry SR . No LE extremity edema   Psychiatric:  . Mental status o Mood, affect appropriate  Discharge Instructions   Allergies as of 07/08/2018      Reactions   Sulfa Antibiotics Hives   Penicillins Itching, Rash   Tolerates ceftriaxone and cefdinir  Has patient had a PCN reaction causing immediate rash, facial/tongue/throat swelling, SOB or lightheadedness with hypotension: Yes Has patient had a PCN reaction causing severe rash involving mucus membranes or skin necrosis: Unk Has patient had a PCN reaction that required hospitalization: Unk Has patient had a PCN reaction occurring within the last 10 years: Unk If all of the above answers are "NO", then may proceed with Cephalosporin use.      Medication List      TAKE these medications   acetaminophen 325 MG tablet Commonly known as:  TYLENOL Take 650 mg by mouth See admin instructions. Take 650 mg by mouth once a day at 2 PM What changed:  Another medication with the same name was removed. Continue taking this medication, and follow the directions you see here.   albuterol (2.5 MG/3ML) 0.083% nebulizer solution Commonly known as:  PROVENTIL Take 2.5 mg by nebulization every 8 (eight) hours as needed for wheezing or shortness of breath.   albuterol 108 (90 Base) MCG/ACT inhaler Commonly known as:  PROVENTIL HFA;VENTOLIN HFA Inhale 2 puffs into the lungs every 6 (six) hours as needed for wheezing or shortness of breath.   apixaban 2.5 MG Tabs tablet Commonly known as:  ELIQUIS Take 1 tablet (2.5 mg total) by mouth 2 (two) times daily.   benzonatate 100 MG capsule Commonly known as:  TESSALON Take 100 mg by mouth 3 (three) times daily as needed for cough.   bumetanide 1 MG tablet Commonly known as:  BUMEX Take 2 mg by mouth daily.   eucerin cream Apply 1 application topically daily as needed (for dryness or irritation, to LEFT SIDE OF NECK).   feeding supplement (ENSURE ENLIVE) Liqd Take 237 mLs by mouth 3 (three) times daily between meals.   fluticasone 50 MCG/ACT nasal spray Commonly known as:  FLONASE Place 2 sprays into both nostrils daily.   guaiFENesin-dextromethorphan 100-10 MG/5ML syrup Commonly known as:  ROBITUSSIN DM Take 5 mLs by mouth every 4 (four) hours  as needed for cough.   ipratropium-albuterol 0.5-2.5 (3) MG/3ML Soln Commonly known as:  DUONEB Take 3 mLs by nebulization 3 (three) times daily.   KLOR-CON 20 MEQ packet Generic drug:  potassium chloride Take 20 mEq by mouth 2 (two) times daily.   levothyroxine 75 MCG tablet Commonly known as:  SYNTHROID, LEVOTHROID Take 75 mcg by mouth daily before breakfast.   loratadine 10 MG tablet Commonly known as:  CLARITIN Take 10 mg by mouth daily.   Melatonin  5 MG Tabs Take 5 mg by mouth at bedtime.   metoprolol tartrate 25 MG tablet Commonly known as:  LOPRESSOR Take 1 tablet (25 mg total) by mouth 2 (two) times daily. What changed:  how much to take   multivitamin with minerals Tabs tablet Take 1 tablet by mouth daily. Start taking on:  07/09/2018   SYSTANE ULTRA PF 0.4-0.3 % Soln Generic drug:  Polyethyl Glyc-Propyl Glyc PF Place 1 drop into both eyes See admin instructions. Instill 1 drop into both eyes four times a day and 1 drop into both eyes four times a day as needed for dryness/irritation   traMADol 50 MG tablet Commonly known as:  ULTRAM Take 1 tablet (50 mg total) by mouth every 12 (twelve) hours as needed for moderate pain. What changed:    when to take this  reasons to take this  additional instructions   traZODone 50 MG tablet Commonly known as:  DESYREL Take 50 mg by mouth at bedtime.      Allergies  Allergen Reactions  . Sulfa Antibiotics Hives  . Penicillins Itching and Rash    Tolerates ceftriaxone and cefdinir  Has patient had a PCN reaction causing immediate rash, facial/tongue/throat swelling, SOB or lightheadedness with hypotension: Yes Has patient had a PCN reaction causing severe rash involving mucus membranes or skin necrosis: Unk Has patient had a PCN reaction that required hospitalization: Unk Has patient had a PCN reaction occurring within the last 10 years: Unk If all of the above answers are "NO", then may proceed with Cephalosporin use.     The results of significant diagnostics from this hospitalization (including imaging, microbiology, ancillary and laboratory) are listed below for reference.    Significant Diagnostic Studies: Dg Chest 1 View  Result Date: 07/02/2018 CLINICAL DATA:  Dyspnea. EXAM: CHEST  1 VIEW COMPARISON:  06/30/2018 and 03/05/2018. FINDINGS: Lungs are hyperexpanded. There are thickened bronchovascular markings bilaterally, with prominent interstitial markings. There is  additional upper lobe scarring. Suspect small effusions.  No pneumothorax. Cardiac silhouette is mildly enlarged. No mediastinal or hilar masses. IMPRESSION: 1. Findings are similar to the most recent prior exam allowing for differences in lung volume and patient positioning. 2. Cardiomegaly with prominent interstitial markings and probable small effusions. Suspect a component of congestive heart failure with interstitial pulmonary edema superimposed on chronic interstitial lung disease. Electronically Signed   By: Lajean Manes M.D.   On: 07/02/2018 13:06   Dg Chest Portable 1 View  Result Date: 06/30/2018 CLINICAL DATA:  Short of breath. EXAM: PORTABLE CHEST 1 VIEW COMPARISON:  03/05/2018 FINDINGS: External pad overlies mediastinum. Stable cardiac silhouette. There are low lung volumes. Bilateral small effusions. There is central venous pulmonary congestion and mild interstitial edema pattern. No change from prior. No pneumothorax. Atherosclerotic calcification of the aorta. IMPRESSION: Effusions and interstitial edema pattern.  No significant change. Electronically Signed   By: Suzy Bouchard M.D.   On: 06/30/2018 14:24    Microbiology: Recent Results (from the past 240  hour(s))  Blood culture (routine x 2)     Status: None   Collection Time: 06/30/18  2:57 PM  Result Value Ref Range Status   Specimen Description BLOOD RIGHT ANTECUBITAL  Final   Special Requests   Final    BOTTLES DRAWN AEROBIC AND ANAEROBIC Blood Culture adequate volume   Culture   Final    NO GROWTH 5 DAYS Performed at [facility], [address], [place], [place] [address]    Report Status 07/05/2018 FINAL  Final  Urine culture     Status: None   Collection Time: 06/30/18  3:38 PM  Result Value Ref Range Status   Specimen Description URINE, CATHETERIZED  Final   Special Requests NONE  Final   Culture   Final    NO GROWTH Performed at [facility], [place] [address] [place], [place] [address]    Report  Status 07/01/2018 FINAL  Final  Blood culture (routine x 2)     Status: None   Collection Time: 06/30/18  4:07 PM  Result Value Ref Range Status   Specimen Description BLOOD BLOOD RIGHT FOREARM  Final   Special Requests   Final    BOTTLES DRAWN AEROBIC ONLY Blood Culture adequate volume   Culture   Final    NO GROWTH 5 DAYS Performed at [facility], [place] [address] [place], [place] [address]    Report Status 07/05/2018 FINAL  Final  Respiratory Panel by PCR     Status: None   Collection Time: 06/30/18  7:55 PM  Result Value Ref Range Status   Adenovirus NOT DETECTED NOT DETECTED Final   Coronavirus 229E NOT DETECTED NOT DETECTED Final   Coronavirus HKU1 NOT DETECTED NOT DETECTED Final   Coronavirus NL63 NOT DETECTED NOT DETECTED Final   Coronavirus OC43 NOT DETECTED NOT DETECTED Final   Metapneumovirus NOT DETECTED NOT DETECTED Final   Rhinovirus / Enterovirus NOT DETECTED NOT DETECTED Final   Influenza A NOT DETECTED NOT DETECTED Final   Influenza B NOT DETECTED NOT DETECTED Final   Parainfluenza Virus 1 NOT DETECTED NOT DETECTED Final   Parainfluenza Virus 2 NOT DETECTED NOT DETECTED Final   Parainfluenza Virus 3 NOT DETECTED NOT DETECTED Final   Parainfluenza Virus 4 NOT DETECTED NOT DETECTED Final   Respiratory Syncytial Virus NOT DETECTED NOT DETECTED Final   Bordetella pertussis NOT DETECTED NOT DETECTED Final   Chlamydophila pneumoniae NOT DETECTED NOT DETECTED Final   Mycoplasma pneumoniae NOT DETECTED NOT DETECTED Final    Comment: Performed at [facility], [place] [address] [place], [place] [address]     Labs: Basic Metabolic Panel: Recent Labs  Lab 07/02/18 0242 07/03/18 0331 07/04/18 0333 07/05/18 0901 07/06/18 0355  NA 138 142 135 135 137  K 3.5 2.9* 5.5* 3.4* 3.5  CL 101 96* 97* 94* 92*  CO2 26 29 29 31  33*  GLUCOSE 122* 100* 104* 166* 114*  BUN 26* 29* 34* 36* 35*  CREATININE 1.43* 1.39* 1.43* 1.40* 1.43*  CALCIUM 8.3* 8.8* 8.2* 8.5* 8.4*     CBC: Recent Labs  Lab 07/02/18 0242 07/03/18 0331 07/04/18 0333  WBC 17.2* 16.7* 14.6*  NEUTROABS 14.8* 13.3* 11.3*  HGB 10.4* 10.8* 10.5*  HCT 33.1* 33.5* 32.8*  MCV 90.9 90.3 90.9  PLT 236 271 295    Recent Labs    03/05/18 1658 06/30/18 1354  BNP 796.7* 80.4    CBG: Recent Labs  Lab 07/04/18 0606 07/05/18  1115 07/06/18 0607 07/07/18 0609 07/08/18 0625  GLUCAP 125* 111* 111* 144* 128*    Principal Problem:   Acute on chronic diastolic CHF (congestive heart failure) (HCC) Active Problems:   HTN (hypertension)   Atrial fibrillation with rapid ventricular response (HCC)   Acute hypoxemic respiratory failure (HCC)   Hyperkalemia   Demand ischemia (HCC)   Goals of care, counseling/discussion   Palliative care by specialist   Time coordinating discharge: 35 minutes  Signed:  Murray Hodgkins, MD [facility] 07/08/2018, 10:54 AM

## 2018-07-08 NOTE — Progress Notes (Signed)
Pt being discharged has a legal gaurdian.  Contacted  Beth Corzine (niece) at [phone] and nephew Ileene Patrick at [phone] to notify of discharge to [facility] room 610-A.  Called report to [facility] [phone] and provided report to nursing supervisor, Lattie Haw.  Obtained vital signs on pt which were stable and removed peripheral IV.  Patient acknowledged that she understood that she was being discharged to [facility].  AVS summary and DNR form provided to [facility] for transport.

## 2018-07-09 DIAGNOSIS — E039 Hypothyroidism, unspecified: Secondary | ICD-10-CM | POA: Diagnosis not present

## 2018-07-09 DIAGNOSIS — J9600 Acute respiratory failure, unspecified whether with hypoxia or hypercapnia: Secondary | ICD-10-CM | POA: Diagnosis not present

## 2018-07-09 DIAGNOSIS — Z933 Colostomy status: Secondary | ICD-10-CM | POA: Diagnosis not present

## 2018-07-09 DIAGNOSIS — I10 Essential (primary) hypertension: Secondary | ICD-10-CM | POA: Diagnosis not present

## 2018-07-09 DIAGNOSIS — I4891 Unspecified atrial fibrillation: Secondary | ICD-10-CM | POA: Diagnosis not present

## 2018-07-09 DIAGNOSIS — H04129 Dry eye syndrome of unspecified lacrimal gland: Secondary | ICD-10-CM | POA: Diagnosis not present

## 2018-07-09 DIAGNOSIS — G4700 Insomnia, unspecified: Secondary | ICD-10-CM | POA: Diagnosis not present

## 2018-07-09 DIAGNOSIS — R52 Pain, unspecified: Secondary | ICD-10-CM | POA: Diagnosis not present

## 2018-07-09 DIAGNOSIS — J449 Chronic obstructive pulmonary disease, unspecified: Secondary | ICD-10-CM | POA: Diagnosis not present

## 2018-07-09 DIAGNOSIS — I5033 Acute on chronic diastolic (congestive) heart failure: Secondary | ICD-10-CM | POA: Diagnosis not present

## 2018-07-09 DIAGNOSIS — F0150 Vascular dementia without behavioral disturbance: Secondary | ICD-10-CM | POA: Diagnosis not present

## 2018-07-09 DIAGNOSIS — N183 Chronic kidney disease, stage 3 (moderate): Secondary | ICD-10-CM | POA: Diagnosis not present

## 2018-07-10 DIAGNOSIS — I10 Essential (primary) hypertension: Secondary | ICD-10-CM | POA: Diagnosis not present

## 2018-07-10 DIAGNOSIS — Z933 Colostomy status: Secondary | ICD-10-CM | POA: Diagnosis not present

## 2018-07-10 DIAGNOSIS — H04129 Dry eye syndrome of unspecified lacrimal gland: Secondary | ICD-10-CM | POA: Diagnosis not present

## 2018-07-10 DIAGNOSIS — M6281 Muscle weakness (generalized): Secondary | ICD-10-CM | POA: Diagnosis not present

## 2018-07-10 DIAGNOSIS — R52 Pain, unspecified: Secondary | ICD-10-CM | POA: Diagnosis not present

## 2018-07-10 DIAGNOSIS — R6510 Systemic inflammatory response syndrome (SIRS) of non-infectious origin without acute organ dysfunction: Secondary | ICD-10-CM | POA: Diagnosis not present

## 2018-07-10 DIAGNOSIS — I4891 Unspecified atrial fibrillation: Secondary | ICD-10-CM | POA: Diagnosis not present

## 2018-07-10 DIAGNOSIS — F0150 Vascular dementia without behavioral disturbance: Secondary | ICD-10-CM | POA: Diagnosis not present

## 2018-07-10 DIAGNOSIS — J9601 Acute respiratory failure with hypoxia: Secondary | ICD-10-CM | POA: Diagnosis not present

## 2018-07-10 DIAGNOSIS — E039 Hypothyroidism, unspecified: Secondary | ICD-10-CM | POA: Diagnosis not present

## 2018-07-10 DIAGNOSIS — J449 Chronic obstructive pulmonary disease, unspecified: Secondary | ICD-10-CM | POA: Diagnosis not present

## 2018-07-10 DIAGNOSIS — I5033 Acute on chronic diastolic (congestive) heart failure: Secondary | ICD-10-CM | POA: Diagnosis not present

## 2018-07-11 DIAGNOSIS — J449 Chronic obstructive pulmonary disease, unspecified: Secondary | ICD-10-CM | POA: Diagnosis not present

## 2018-07-11 DIAGNOSIS — J9600 Acute respiratory failure, unspecified whether with hypoxia or hypercapnia: Secondary | ICD-10-CM | POA: Diagnosis not present

## 2018-07-11 DIAGNOSIS — M40293 Other kyphosis, cervicothoracic region: Secondary | ICD-10-CM | POA: Diagnosis not present

## 2018-07-17 ENCOUNTER — Non-Acute Institutional Stay: Payer: Medicare Other | Admitting: Licensed Clinical Social Worker

## 2018-07-17 ENCOUNTER — Non-Acute Institutional Stay: Payer: Medicare Other | Admitting: *Deleted

## 2018-07-17 DIAGNOSIS — Z515 Encounter for palliative care: Secondary | ICD-10-CM

## 2018-07-18 NOTE — Progress Notes (Signed)
COMMUNITY PALLIATIVE CARE SW NOTE  PATIENT NAME: [name] [name] DOB: [birth year] MRN: [id number]  PRIMARY CARE PROVIDER: Lajean Manes, MD  RESPONSIBLE PARTY:  Acct ID - Guarantor Home Phone Work Phone Relationship Acct Type  [id number] - [name] [name] [phone]  Self P/F     [place], [place], [place] [address]     PLAN OF CARE and INTERVENTIONS:             1. GOALS OF CARE/ ADVANCE CARE PLANNING:  Patient's goal is to return to [place], where she lived prior to [facility] SNF.  She has a DNR and MOST form. 2. SOCIAL/EMOTIONAL/SPIRITUAL ASSESSMENT/ INTERVENTIONS:  SW and Palliative Care RN, [name] [name], met with patient and LPN, [name] [name].  Patient was born and raised in [place], [place].  She graduated from [employer] with a degree in education.  She received her graduate degree in education from [employer].  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  She remained single and did not have any children.  Patient said she lived at [facility] for three years prior to being admitted to [facility].  She did not recall why she had to move to the SNF.  She was receiving physical therapy when team arrived.  She was in her w/c and stated she felt uncomfortable for sitting so long.  She appears to have adjusted to the move. 3. PATIENT/CAREGIVER EDUCATION/ COPING:  Patient expresses her feelings openly. 4. PERSONAL EMERGENCY PLAN:  Per facility protocol. 5. COMMUNITY RESOURCES COORDINATION/ HEALTH CARE NAVIGATION:  None. 6. FINANCIAL/LEGAL CONCERNS/INTERVENTIONS:  None per patient.     SOCIAL HX:  Social History   Tobacco Use  . Smoking status: Former Research scientist (life sciences)  . Smokeless tobacco: Never Used  Substance Use Topics  . Alcohol use: No    CODE STATUS:   DNR  ADVANCED DIRECTIVES: HCPOA.  Patient's niece, [name] [name], is her legal guardian. MOST FORM COMPLETE:  MOST form indicates DNR, comfort care, ABT if infection, no IV's and no feeding tube. HOSPICE EDUCATION  PROVIDED: No PPS:  Patient's appetite is normal.  Patient uses a w/c. Duration of visit and documentation:  60 minutes.      [name] [name] [name] Lindenberger, LCSW

## 2018-07-18 NOTE — Progress Notes (Signed)
COMMUNITY PALLIATIVE CARE RN NOTE  PATIENT NAME: [name] [name] DOB: 1922/10/10 MRN: [id number]  PRIMARY CARE PROVIDER: Lajean Manes, MD  RESPONSIBLE PARTY:  Acct ID - Guarantor Home Phone Work Phone Relationship Acct Type  [id number] - Hobin,Danajah [phone]  Self P/F     [address], [place], [place] [address]    PLAN OF CARE and INTERVENTION:  1. ADVANCE CARE PLANNING/GOALS OF CARE: To return back to [facility] when able 2. PATIENT/CAREGIVER EDUCATION: Explained Palliative Care Services 3. DISEASE STATUS: Joint visit made with Palliative Care SW, [name]. Reviewed chart. Met with patient outside of the Therapy room. She was just finishing her PT session. She feels that the session went well. She is working on being able to transfer from her wheelchair. She currently requires 1 person assistance with all ADLs, but is able to feed herself independently. She has a colostomy. Intermittently incontinent of bladder. Wears Depends. Staff reports that her Oxygen level easily drops into the upper 80s. The highest level they are able to achieve is 90% on 3L/min. Oxygen is continuous. No dyspnea noted during visit. Intake fair. Denies any nausea. Reports feeling somewhat uncomfortable in her wheelchair, as she has been sitting up for several hours. She reports sleeping in her recliner during the night. Took patient back to therapy room at end of visit for an Activity with other residents. Will continue to monitor.    HISTORY OF PRESENT ILLNESS: This is a 84 yo female who currently resides at [facility] on SNF unit for Rehab. She was recently hospitalized from 06/30/18 to 07/08/18 due to Afib with RVR and hypoxemia. Palliative Care Team asked to follow patient. Will visit monthly and PRN.  CODE STATUS: DNR ADVANCED DIRECTIVES: Y MOST FORM: yes PPS: 30%   PHYSICAL EXAM:   LUNGS: clear to auscultation  CARDIAC: Cor RRR EXTREMITIES: No edema SKIN: Exposed skin is dry and intact  NEURO: Alert  and oriented x 3, pleasant mood, forgetful, generalized weakness, wheelchair bound   (Duration of visit and documentation 60 minutes)    Daryl Eastern, RN, BSN

## 2018-07-27 DIAGNOSIS — E871 Hypo-osmolality and hyponatremia: Secondary | ICD-10-CM | POA: Diagnosis not present

## 2018-07-27 DIAGNOSIS — N189 Chronic kidney disease, unspecified: Secondary | ICD-10-CM | POA: Diagnosis not present

## 2018-07-27 DIAGNOSIS — J449 Chronic obstructive pulmonary disease, unspecified: Secondary | ICD-10-CM | POA: Diagnosis not present

## 2018-07-27 DIAGNOSIS — N390 Urinary tract infection, site not specified: Secondary | ICD-10-CM | POA: Diagnosis not present

## 2018-07-29 DIAGNOSIS — Z7951 Long term (current) use of inhaled steroids: Secondary | ICD-10-CM | POA: Diagnosis not present

## 2018-07-29 DIAGNOSIS — F0280 Dementia in other diseases classified elsewhere without behavioral disturbance: Secondary | ICD-10-CM | POA: Diagnosis not present

## 2018-07-29 DIAGNOSIS — Z433 Encounter for attention to colostomy: Secondary | ICD-10-CM | POA: Diagnosis not present

## 2018-07-29 DIAGNOSIS — Z9181 History of falling: Secondary | ICD-10-CM | POA: Diagnosis not present

## 2018-07-29 DIAGNOSIS — N183 Chronic kidney disease, stage 3 (moderate): Secondary | ICD-10-CM | POA: Diagnosis not present

## 2018-07-29 DIAGNOSIS — E039 Hypothyroidism, unspecified: Secondary | ICD-10-CM | POA: Diagnosis not present

## 2018-07-29 DIAGNOSIS — R41841 Cognitive communication deficit: Secondary | ICD-10-CM | POA: Diagnosis not present

## 2018-07-29 DIAGNOSIS — I2510 Atherosclerotic heart disease of native coronary artery without angina pectoris: Secondary | ICD-10-CM | POA: Diagnosis not present

## 2018-07-29 DIAGNOSIS — J449 Chronic obstructive pulmonary disease, unspecified: Secondary | ICD-10-CM | POA: Diagnosis not present

## 2018-07-29 DIAGNOSIS — Z9981 Dependence on supplemental oxygen: Secondary | ICD-10-CM | POA: Diagnosis not present

## 2018-07-29 DIAGNOSIS — I130 Hypertensive heart and chronic kidney disease with heart failure and stage 1 through stage 4 chronic kidney disease, or unspecified chronic kidney disease: Secondary | ICD-10-CM | POA: Diagnosis not present

## 2018-07-29 DIAGNOSIS — Z8542 Personal history of malignant neoplasm of other parts of uterus: Secondary | ICD-10-CM | POA: Diagnosis not present

## 2018-07-29 DIAGNOSIS — F0150 Vascular dementia without behavioral disturbance: Secondary | ICD-10-CM | POA: Diagnosis not present

## 2018-07-29 DIAGNOSIS — M170 Bilateral primary osteoarthritis of knee: Secondary | ICD-10-CM | POA: Diagnosis not present

## 2018-07-29 DIAGNOSIS — M810 Age-related osteoporosis without current pathological fracture: Secondary | ICD-10-CM | POA: Diagnosis not present

## 2018-07-29 DIAGNOSIS — I482 Chronic atrial fibrillation, unspecified: Secondary | ICD-10-CM | POA: Diagnosis not present

## 2018-07-29 DIAGNOSIS — Z87891 Personal history of nicotine dependence: Secondary | ICD-10-CM | POA: Diagnosis not present

## 2018-07-29 DIAGNOSIS — K5790 Diverticulosis of intestine, part unspecified, without perforation or abscess without bleeding: Secondary | ICD-10-CM | POA: Diagnosis not present

## 2018-07-29 DIAGNOSIS — D649 Anemia, unspecified: Secondary | ICD-10-CM | POA: Diagnosis not present

## 2018-07-29 DIAGNOSIS — L89322 Pressure ulcer of left buttock, stage 2: Secondary | ICD-10-CM | POA: Diagnosis not present

## 2018-07-29 DIAGNOSIS — H04129 Dry eye syndrome of unspecified lacrimal gland: Secondary | ICD-10-CM | POA: Diagnosis not present

## 2018-07-29 DIAGNOSIS — I5033 Acute on chronic diastolic (congestive) heart failure: Secondary | ICD-10-CM | POA: Diagnosis not present

## 2018-07-29 DIAGNOSIS — G4700 Insomnia, unspecified: Secondary | ICD-10-CM | POA: Diagnosis not present

## 2018-07-29 DIAGNOSIS — J441 Chronic obstructive pulmonary disease with (acute) exacerbation: Secondary | ICD-10-CM | POA: Diagnosis not present

## 2018-07-31 DIAGNOSIS — I2510 Atherosclerotic heart disease of native coronary artery without angina pectoris: Secondary | ICD-10-CM | POA: Diagnosis not present

## 2018-07-31 DIAGNOSIS — I130 Hypertensive heart and chronic kidney disease with heart failure and stage 1 through stage 4 chronic kidney disease, or unspecified chronic kidney disease: Secondary | ICD-10-CM | POA: Diagnosis not present

## 2018-07-31 DIAGNOSIS — I5033 Acute on chronic diastolic (congestive) heart failure: Secondary | ICD-10-CM | POA: Diagnosis not present

## 2018-07-31 DIAGNOSIS — N183 Chronic kidney disease, stage 3 (moderate): Secondary | ICD-10-CM | POA: Diagnosis not present

## 2018-07-31 DIAGNOSIS — I482 Chronic atrial fibrillation, unspecified: Secondary | ICD-10-CM | POA: Diagnosis not present

## 2018-07-31 DIAGNOSIS — Z79899 Other long term (current) drug therapy: Secondary | ICD-10-CM | POA: Diagnosis not present

## 2018-07-31 DIAGNOSIS — J449 Chronic obstructive pulmonary disease, unspecified: Secondary | ICD-10-CM | POA: Diagnosis not present

## 2018-07-31 DIAGNOSIS — N390 Urinary tract infection, site not specified: Secondary | ICD-10-CM | POA: Diagnosis not present

## 2018-07-31 DIAGNOSIS — R319 Hematuria, unspecified: Secondary | ICD-10-CM | POA: Diagnosis not present

## 2018-08-01 DIAGNOSIS — I482 Chronic atrial fibrillation, unspecified: Secondary | ICD-10-CM | POA: Diagnosis not present

## 2018-08-01 DIAGNOSIS — I2510 Atherosclerotic heart disease of native coronary artery without angina pectoris: Secondary | ICD-10-CM | POA: Diagnosis not present

## 2018-08-01 DIAGNOSIS — J449 Chronic obstructive pulmonary disease, unspecified: Secondary | ICD-10-CM | POA: Diagnosis not present

## 2018-08-01 DIAGNOSIS — I130 Hypertensive heart and chronic kidney disease with heart failure and stage 1 through stage 4 chronic kidney disease, or unspecified chronic kidney disease: Secondary | ICD-10-CM | POA: Diagnosis not present

## 2018-08-01 DIAGNOSIS — N183 Chronic kidney disease, stage 3 (moderate): Secondary | ICD-10-CM | POA: Diagnosis not present

## 2018-08-01 DIAGNOSIS — I5033 Acute on chronic diastolic (congestive) heart failure: Secondary | ICD-10-CM | POA: Diagnosis not present

## 2018-08-02 ENCOUNTER — Ambulatory Visit
Admission: RE | Admit: 2018-08-02 | Discharge: 2018-08-02 | Disposition: A | Discharge status: Home / Self Care | Payer: Medicare Other | Source: Ambulatory Visit | Attending: Geriatric Medicine | Admitting: Geriatric Medicine

## 2018-08-02 ENCOUNTER — Other Ambulatory Visit: Payer: Self-pay | Admitting: Geriatric Medicine

## 2018-08-02 DIAGNOSIS — I480 Paroxysmal atrial fibrillation: Secondary | ICD-10-CM | POA: Diagnosis not present

## 2018-08-02 DIAGNOSIS — M545 Low back pain, unspecified: Secondary | ICD-10-CM

## 2018-08-02 DIAGNOSIS — I129 Hypertensive chronic kidney disease with stage 1 through stage 4 chronic kidney disease, or unspecified chronic kidney disease: Secondary | ICD-10-CM | POA: Diagnosis not present

## 2018-08-02 DIAGNOSIS — Z79899 Other long term (current) drug therapy: Secondary | ICD-10-CM | POA: Diagnosis not present

## 2018-08-02 DIAGNOSIS — N183 Chronic kidney disease, stage 3 (moderate): Secondary | ICD-10-CM | POA: Diagnosis not present

## 2018-08-02 DIAGNOSIS — D649 Anemia, unspecified: Secondary | ICD-10-CM | POA: Diagnosis not present

## 2018-08-02 DIAGNOSIS — Z933 Colostomy status: Secondary | ICD-10-CM | POA: Diagnosis not present

## 2018-08-02 DIAGNOSIS — Z23 Encounter for immunization: Secondary | ICD-10-CM | POA: Diagnosis not present

## 2018-08-03 DIAGNOSIS — I482 Chronic atrial fibrillation, unspecified: Secondary | ICD-10-CM | POA: Diagnosis not present

## 2018-08-03 DIAGNOSIS — J449 Chronic obstructive pulmonary disease, unspecified: Secondary | ICD-10-CM | POA: Diagnosis not present

## 2018-08-03 DIAGNOSIS — I130 Hypertensive heart and chronic kidney disease with heart failure and stage 1 through stage 4 chronic kidney disease, or unspecified chronic kidney disease: Secondary | ICD-10-CM | POA: Diagnosis not present

## 2018-08-03 DIAGNOSIS — I2510 Atherosclerotic heart disease of native coronary artery without angina pectoris: Secondary | ICD-10-CM | POA: Diagnosis not present

## 2018-08-03 DIAGNOSIS — N183 Chronic kidney disease, stage 3 (moderate): Secondary | ICD-10-CM | POA: Diagnosis not present

## 2018-08-03 DIAGNOSIS — I5033 Acute on chronic diastolic (congestive) heart failure: Secondary | ICD-10-CM | POA: Diagnosis not present

## 2018-08-06 DIAGNOSIS — I5033 Acute on chronic diastolic (congestive) heart failure: Secondary | ICD-10-CM | POA: Diagnosis not present

## 2018-08-06 DIAGNOSIS — I130 Hypertensive heart and chronic kidney disease with heart failure and stage 1 through stage 4 chronic kidney disease, or unspecified chronic kidney disease: Secondary | ICD-10-CM | POA: Diagnosis not present

## 2018-08-06 DIAGNOSIS — I482 Chronic atrial fibrillation, unspecified: Secondary | ICD-10-CM | POA: Diagnosis not present

## 2018-08-06 DIAGNOSIS — J449 Chronic obstructive pulmonary disease, unspecified: Secondary | ICD-10-CM | POA: Diagnosis not present

## 2018-08-06 DIAGNOSIS — I2510 Atherosclerotic heart disease of native coronary artery without angina pectoris: Secondary | ICD-10-CM | POA: Diagnosis not present

## 2018-08-06 DIAGNOSIS — N183 Chronic kidney disease, stage 3 (moderate): Secondary | ICD-10-CM | POA: Diagnosis not present

## 2018-08-07 DIAGNOSIS — N183 Chronic kidney disease, stage 3 (moderate): Secondary | ICD-10-CM | POA: Diagnosis not present

## 2018-08-07 DIAGNOSIS — J449 Chronic obstructive pulmonary disease, unspecified: Secondary | ICD-10-CM | POA: Diagnosis not present

## 2018-08-07 DIAGNOSIS — I482 Chronic atrial fibrillation, unspecified: Secondary | ICD-10-CM | POA: Diagnosis not present

## 2018-08-07 DIAGNOSIS — I5033 Acute on chronic diastolic (congestive) heart failure: Secondary | ICD-10-CM | POA: Diagnosis not present

## 2018-08-07 DIAGNOSIS — I2510 Atherosclerotic heart disease of native coronary artery without angina pectoris: Secondary | ICD-10-CM | POA: Diagnosis not present

## 2018-08-07 DIAGNOSIS — I130 Hypertensive heart and chronic kidney disease with heart failure and stage 1 through stage 4 chronic kidney disease, or unspecified chronic kidney disease: Secondary | ICD-10-CM | POA: Diagnosis not present

## 2018-08-09 ENCOUNTER — Non-Acute Institutional Stay: Payer: Medicare Other | Admitting: Licensed Clinical Social Worker

## 2018-08-09 ENCOUNTER — Non-Acute Institutional Stay: Payer: Medicare Other | Admitting: *Deleted

## 2018-08-09 DIAGNOSIS — I2510 Atherosclerotic heart disease of native coronary artery without angina pectoris: Secondary | ICD-10-CM | POA: Diagnosis not present

## 2018-08-09 DIAGNOSIS — Z515 Encounter for palliative care: Secondary | ICD-10-CM

## 2018-08-09 DIAGNOSIS — I5033 Acute on chronic diastolic (congestive) heart failure: Secondary | ICD-10-CM | POA: Diagnosis not present

## 2018-08-09 DIAGNOSIS — I130 Hypertensive heart and chronic kidney disease with heart failure and stage 1 through stage 4 chronic kidney disease, or unspecified chronic kidney disease: Secondary | ICD-10-CM | POA: Diagnosis not present

## 2018-08-09 DIAGNOSIS — I482 Chronic atrial fibrillation, unspecified: Secondary | ICD-10-CM | POA: Diagnosis not present

## 2018-08-09 DIAGNOSIS — J449 Chronic obstructive pulmonary disease, unspecified: Secondary | ICD-10-CM | POA: Diagnosis not present

## 2018-08-09 DIAGNOSIS — N183 Chronic kidney disease, stage 3 (moderate): Secondary | ICD-10-CM | POA: Diagnosis not present

## 2018-08-10 DIAGNOSIS — I482 Chronic atrial fibrillation, unspecified: Secondary | ICD-10-CM | POA: Diagnosis not present

## 2018-08-10 DIAGNOSIS — I5033 Acute on chronic diastolic (congestive) heart failure: Secondary | ICD-10-CM | POA: Diagnosis not present

## 2018-08-10 DIAGNOSIS — N183 Chronic kidney disease, stage 3 (moderate): Secondary | ICD-10-CM | POA: Diagnosis not present

## 2018-08-10 DIAGNOSIS — I2510 Atherosclerotic heart disease of native coronary artery without angina pectoris: Secondary | ICD-10-CM | POA: Diagnosis not present

## 2018-08-10 DIAGNOSIS — I130 Hypertensive heart and chronic kidney disease with heart failure and stage 1 through stage 4 chronic kidney disease, or unspecified chronic kidney disease: Secondary | ICD-10-CM | POA: Diagnosis not present

## 2018-08-10 DIAGNOSIS — J449 Chronic obstructive pulmonary disease, unspecified: Secondary | ICD-10-CM | POA: Diagnosis not present

## 2018-08-10 NOTE — Progress Notes (Signed)
COMMUNITY PALLIATIVE CARE SW NOTE  PATIENT NAME: [name] [name] DOB: June 13, 1923 MRN: [id number]  PRIMARY CARE PROVIDER: Lajean Manes, MD  RESPONSIBLE PARTY:  Acct ID - Guarantor Home Phone Work Phone Relationship Acct Type  [id number] - Roads,Tekisha [phone]  Self P/F     [address] [place] [place], [place], [place] [address]     PLAN OF CARE and INTERVENTIONS:             1. GOALS OF CARE/ ADVANCE CARE PLANNING:  Goal is for patient to remain at the facility.  She has a DNR and MOST form. 2. SOCIAL/EMOTIONAL/SPIRITUAL ASSESSMENT/ INTERVENTIONS:  SW and Palliative Care RN, Daryl Eastern, met with patient in her room at [facility].  She was in her w/c and a [facility] Nurse was present.  Patient denied pain and answered questions with a few words. 3. PATIENT/CAREGIVER EDUCATION/ COPING:  Patient expresses her feelings openly. 4. PERSONAL EMERGENCY PLAN:  Per facility protocial. 5. COMMUNITY RESOURCES COORDINATION/ HEALTH CARE NAVIGATION:  [facility] RN was present during visit. 6. FINANCIAL/LEGAL CONCERNS/INTERVENTIONS:  None.     SOCIAL HX:  Social History   Tobacco Use  . Smoking status: Former Research scientist (life sciences)  . Smokeless tobacco: Never Used  Substance Use Topics  . Alcohol use: No    CODE STATUS:  DNR ADVANCED DIRECTIVES:  HCPOA.  Patient's niece, [name] [name], is her legal guardian. MOST FORM COMPLETE:  Yes HOSPICE EDUCATION PROVIDED:  No PPS: Patient's appetite is normal.  She is w/c bound. Duration of visit and documentation:  30 minutes.      [name] Corn Guelda Batson, LCSW

## 2018-08-10 NOTE — Progress Notes (Signed)
COMMUNITY PALLIATIVE CARE RN NOTE  PATIENT NAME: [name] [name] DOB: 1923-07-29 MRN: [id number]  PRIMARY CARE PROVIDER: Lajean Manes, MD  RESPONSIBLE PARTY:  Acct ID - Guarantor Home Phone Work Phone Relationship Acct Type  [id number] - Casciano,Shery [phone]  Self P/F     [place], [place], [place] [address]    PLAN OF CARE and INTERVENTION:  1. ADVANCE CARE PLANNING/GOALS OF CARE: She wants to remain at current facility and avoid hospitalizations. She is a DNR. 2. PATIENT/CAREGIVER EDUCATION: Explained Palliative Care Services and Reinforced Safe Mobility/Transfers 3. DISEASE STATUS: Joint visit made with Palliative Care SW, [name]. Met with resident in her room. She was recently at [facility] for rehab s/p hospitalization from 06/30/18 to 07/08/18 due to hypoxia, shortness of breath and Afib. She has now returned back to [facility] facility. She is sitting up in her wheelchair awake and alert. Pleasant mood. [facility] home health RN present visiting with patient as well. No physical indicators of pain noted. No dyspnea noted at rest. Continues on Oxygen at 2L/min via  continuously. She requires 1 person assistance with bathing, dressing, transfers and toileting. She is able to feed herself independently. Intermittent urinary incontinence. Wears Depends. Has a colostomy. No complaints this visit. Will continue to monitor.  HISTORY OF PRESENT ILLNESS: This is a 84 yo female who resides at [facility] facility. Palliative Care Team continues to follow. Will visit monthly and PRN.   CODE STATUS: DNR ADVANCED DIRECTIVES: Y MOST FORM: yes PPS: 30%   PHYSICAL EXAM:   LUNGS: No dyspnea noted at rest or during conversation CARDIAC: Cor RRR EXTREMITIES: Trace edema to bilateral lower extremities SKIN: Exposed skin is dry and intact  NEURO: Alert and oriented x 3, pleasant mood, generalized weakness, wheelchair bound   (Duration of visit and documentation 30  minutes)    Daryl Eastern, RN, BSN

## 2018-08-11 DIAGNOSIS — I2510 Atherosclerotic heart disease of native coronary artery without angina pectoris: Secondary | ICD-10-CM | POA: Diagnosis not present

## 2018-08-11 DIAGNOSIS — J449 Chronic obstructive pulmonary disease, unspecified: Secondary | ICD-10-CM | POA: Diagnosis not present

## 2018-08-11 DIAGNOSIS — I482 Chronic atrial fibrillation, unspecified: Secondary | ICD-10-CM | POA: Diagnosis not present

## 2018-08-11 DIAGNOSIS — I5033 Acute on chronic diastolic (congestive) heart failure: Secondary | ICD-10-CM | POA: Diagnosis not present

## 2018-08-11 DIAGNOSIS — N183 Chronic kidney disease, stage 3 (moderate): Secondary | ICD-10-CM | POA: Diagnosis not present

## 2018-08-11 DIAGNOSIS — I130 Hypertensive heart and chronic kidney disease with heart failure and stage 1 through stage 4 chronic kidney disease, or unspecified chronic kidney disease: Secondary | ICD-10-CM | POA: Diagnosis not present

## 2018-08-13 DIAGNOSIS — I482 Chronic atrial fibrillation, unspecified: Secondary | ICD-10-CM | POA: Diagnosis not present

## 2018-08-13 DIAGNOSIS — I130 Hypertensive heart and chronic kidney disease with heart failure and stage 1 through stage 4 chronic kidney disease, or unspecified chronic kidney disease: Secondary | ICD-10-CM | POA: Diagnosis not present

## 2018-08-13 DIAGNOSIS — I5033 Acute on chronic diastolic (congestive) heart failure: Secondary | ICD-10-CM | POA: Diagnosis not present

## 2018-08-13 DIAGNOSIS — I2510 Atherosclerotic heart disease of native coronary artery without angina pectoris: Secondary | ICD-10-CM | POA: Diagnosis not present

## 2018-08-13 DIAGNOSIS — J449 Chronic obstructive pulmonary disease, unspecified: Secondary | ICD-10-CM | POA: Diagnosis not present

## 2018-08-13 DIAGNOSIS — N183 Chronic kidney disease, stage 3 (moderate): Secondary | ICD-10-CM | POA: Diagnosis not present

## 2018-08-14 DIAGNOSIS — I5033 Acute on chronic diastolic (congestive) heart failure: Secondary | ICD-10-CM | POA: Diagnosis not present

## 2018-08-14 DIAGNOSIS — I482 Chronic atrial fibrillation, unspecified: Secondary | ICD-10-CM | POA: Diagnosis not present

## 2018-08-14 DIAGNOSIS — I2510 Atherosclerotic heart disease of native coronary artery without angina pectoris: Secondary | ICD-10-CM | POA: Diagnosis not present

## 2018-08-14 DIAGNOSIS — N183 Chronic kidney disease, stage 3 (moderate): Secondary | ICD-10-CM | POA: Diagnosis not present

## 2018-08-14 DIAGNOSIS — I130 Hypertensive heart and chronic kidney disease with heart failure and stage 1 through stage 4 chronic kidney disease, or unspecified chronic kidney disease: Secondary | ICD-10-CM | POA: Diagnosis not present

## 2018-08-14 DIAGNOSIS — J449 Chronic obstructive pulmonary disease, unspecified: Secondary | ICD-10-CM | POA: Diagnosis not present

## 2018-08-15 DIAGNOSIS — I2510 Atherosclerotic heart disease of native coronary artery without angina pectoris: Secondary | ICD-10-CM | POA: Diagnosis not present

## 2018-08-15 DIAGNOSIS — N183 Chronic kidney disease, stage 3 (moderate): Secondary | ICD-10-CM | POA: Diagnosis not present

## 2018-08-15 DIAGNOSIS — I482 Chronic atrial fibrillation, unspecified: Secondary | ICD-10-CM | POA: Diagnosis not present

## 2018-08-15 DIAGNOSIS — J449 Chronic obstructive pulmonary disease, unspecified: Secondary | ICD-10-CM | POA: Diagnosis not present

## 2018-08-15 DIAGNOSIS — I130 Hypertensive heart and chronic kidney disease with heart failure and stage 1 through stage 4 chronic kidney disease, or unspecified chronic kidney disease: Secondary | ICD-10-CM | POA: Diagnosis not present

## 2018-08-15 DIAGNOSIS — I5033 Acute on chronic diastolic (congestive) heart failure: Secondary | ICD-10-CM | POA: Diagnosis not present

## 2018-08-16 DIAGNOSIS — N183 Chronic kidney disease, stage 3 (moderate): Secondary | ICD-10-CM | POA: Diagnosis not present

## 2018-08-16 DIAGNOSIS — I2510 Atherosclerotic heart disease of native coronary artery without angina pectoris: Secondary | ICD-10-CM | POA: Diagnosis not present

## 2018-08-16 DIAGNOSIS — I5033 Acute on chronic diastolic (congestive) heart failure: Secondary | ICD-10-CM | POA: Diagnosis not present

## 2018-08-16 DIAGNOSIS — J449 Chronic obstructive pulmonary disease, unspecified: Secondary | ICD-10-CM | POA: Diagnosis not present

## 2018-08-16 DIAGNOSIS — I482 Chronic atrial fibrillation, unspecified: Secondary | ICD-10-CM | POA: Diagnosis not present

## 2018-08-16 DIAGNOSIS — I130 Hypertensive heart and chronic kidney disease with heart failure and stage 1 through stage 4 chronic kidney disease, or unspecified chronic kidney disease: Secondary | ICD-10-CM | POA: Diagnosis not present

## 2018-08-17 DIAGNOSIS — R079 Chest pain, unspecified: Secondary | ICD-10-CM | POA: Diagnosis not present

## 2018-08-17 DIAGNOSIS — R0902 Hypoxemia: Secondary | ICD-10-CM | POA: Diagnosis not present

## 2018-08-17 DIAGNOSIS — R0602 Shortness of breath: Secondary | ICD-10-CM | POA: Diagnosis not present

## 2018-08-17 DIAGNOSIS — R069 Unspecified abnormalities of breathing: Secondary | ICD-10-CM | POA: Diagnosis not present

## 2018-08-17 DIAGNOSIS — R0789 Other chest pain: Secondary | ICD-10-CM | POA: Diagnosis not present

## 2018-08-20 DIAGNOSIS — I2510 Atherosclerotic heart disease of native coronary artery without angina pectoris: Secondary | ICD-10-CM | POA: Diagnosis not present

## 2018-08-20 DIAGNOSIS — I5033 Acute on chronic diastolic (congestive) heart failure: Secondary | ICD-10-CM | POA: Diagnosis not present

## 2018-08-20 DIAGNOSIS — I130 Hypertensive heart and chronic kidney disease with heart failure and stage 1 through stage 4 chronic kidney disease, or unspecified chronic kidney disease: Secondary | ICD-10-CM | POA: Diagnosis not present

## 2018-08-20 DIAGNOSIS — J449 Chronic obstructive pulmonary disease, unspecified: Secondary | ICD-10-CM | POA: Diagnosis not present

## 2018-08-20 DIAGNOSIS — N183 Chronic kidney disease, stage 3 (moderate): Secondary | ICD-10-CM | POA: Diagnosis not present

## 2018-08-20 DIAGNOSIS — I482 Chronic atrial fibrillation, unspecified: Secondary | ICD-10-CM | POA: Diagnosis not present

## 2018-08-21 DIAGNOSIS — N183 Chronic kidney disease, stage 3 (moderate): Secondary | ICD-10-CM | POA: Diagnosis not present

## 2018-08-21 DIAGNOSIS — I482 Chronic atrial fibrillation, unspecified: Secondary | ICD-10-CM | POA: Diagnosis not present

## 2018-08-21 DIAGNOSIS — I5033 Acute on chronic diastolic (congestive) heart failure: Secondary | ICD-10-CM | POA: Diagnosis not present

## 2018-08-21 DIAGNOSIS — I130 Hypertensive heart and chronic kidney disease with heart failure and stage 1 through stage 4 chronic kidney disease, or unspecified chronic kidney disease: Secondary | ICD-10-CM | POA: Diagnosis not present

## 2018-08-21 DIAGNOSIS — I2510 Atherosclerotic heart disease of native coronary artery without angina pectoris: Secondary | ICD-10-CM | POA: Diagnosis not present

## 2018-08-21 DIAGNOSIS — J449 Chronic obstructive pulmonary disease, unspecified: Secondary | ICD-10-CM | POA: Diagnosis not present

## 2018-08-22 DIAGNOSIS — I2510 Atherosclerotic heart disease of native coronary artery without angina pectoris: Secondary | ICD-10-CM | POA: Diagnosis not present

## 2018-08-22 DIAGNOSIS — I5033 Acute on chronic diastolic (congestive) heart failure: Secondary | ICD-10-CM | POA: Diagnosis not present

## 2018-08-22 DIAGNOSIS — I130 Hypertensive heart and chronic kidney disease with heart failure and stage 1 through stage 4 chronic kidney disease, or unspecified chronic kidney disease: Secondary | ICD-10-CM | POA: Diagnosis not present

## 2018-08-22 DIAGNOSIS — N183 Chronic kidney disease, stage 3 (moderate): Secondary | ICD-10-CM | POA: Diagnosis not present

## 2018-08-22 DIAGNOSIS — I482 Chronic atrial fibrillation, unspecified: Secondary | ICD-10-CM | POA: Diagnosis not present

## 2018-08-22 DIAGNOSIS — J449 Chronic obstructive pulmonary disease, unspecified: Secondary | ICD-10-CM | POA: Diagnosis not present

## 2018-08-23 DIAGNOSIS — J449 Chronic obstructive pulmonary disease, unspecified: Secondary | ICD-10-CM | POA: Diagnosis not present

## 2018-08-23 DIAGNOSIS — I5030 Unspecified diastolic (congestive) heart failure: Secondary | ICD-10-CM | POA: Diagnosis not present

## 2018-08-23 DIAGNOSIS — I5033 Acute on chronic diastolic (congestive) heart failure: Secondary | ICD-10-CM | POA: Diagnosis not present

## 2018-08-23 DIAGNOSIS — R001 Bradycardia, unspecified: Secondary | ICD-10-CM | POA: Diagnosis not present

## 2018-08-23 DIAGNOSIS — I130 Hypertensive heart and chronic kidney disease with heart failure and stage 1 through stage 4 chronic kidney disease, or unspecified chronic kidney disease: Secondary | ICD-10-CM | POA: Diagnosis not present

## 2018-08-23 DIAGNOSIS — N183 Chronic kidney disease, stage 3 (moderate): Secondary | ICD-10-CM | POA: Diagnosis not present

## 2018-08-23 DIAGNOSIS — I482 Chronic atrial fibrillation, unspecified: Secondary | ICD-10-CM | POA: Diagnosis not present

## 2018-08-23 DIAGNOSIS — I2510 Atherosclerotic heart disease of native coronary artery without angina pectoris: Secondary | ICD-10-CM | POA: Diagnosis not present

## 2018-08-27 DIAGNOSIS — I5030 Unspecified diastolic (congestive) heart failure: Secondary | ICD-10-CM | POA: Diagnosis not present

## 2018-08-27 DIAGNOSIS — R001 Bradycardia, unspecified: Secondary | ICD-10-CM | POA: Diagnosis not present

## 2018-08-28 DIAGNOSIS — I482 Chronic atrial fibrillation, unspecified: Secondary | ICD-10-CM | POA: Diagnosis not present

## 2018-08-28 DIAGNOSIS — J449 Chronic obstructive pulmonary disease, unspecified: Secondary | ICD-10-CM | POA: Diagnosis not present

## 2018-08-28 DIAGNOSIS — N183 Chronic kidney disease, stage 3 (moderate): Secondary | ICD-10-CM | POA: Diagnosis not present

## 2018-08-28 DIAGNOSIS — I5033 Acute on chronic diastolic (congestive) heart failure: Secondary | ICD-10-CM | POA: Diagnosis not present

## 2018-08-28 DIAGNOSIS — I2510 Atherosclerotic heart disease of native coronary artery without angina pectoris: Secondary | ICD-10-CM | POA: Diagnosis not present

## 2018-08-28 DIAGNOSIS — I130 Hypertensive heart and chronic kidney disease with heart failure and stage 1 through stage 4 chronic kidney disease, or unspecified chronic kidney disease: Secondary | ICD-10-CM | POA: Diagnosis not present

## 2018-08-29 DIAGNOSIS — J449 Chronic obstructive pulmonary disease, unspecified: Secondary | ICD-10-CM | POA: Diagnosis not present

## 2018-08-29 DIAGNOSIS — I130 Hypertensive heart and chronic kidney disease with heart failure and stage 1 through stage 4 chronic kidney disease, or unspecified chronic kidney disease: Secondary | ICD-10-CM | POA: Diagnosis not present

## 2018-08-29 DIAGNOSIS — I5033 Acute on chronic diastolic (congestive) heart failure: Secondary | ICD-10-CM | POA: Diagnosis not present

## 2018-08-29 DIAGNOSIS — I2510 Atherosclerotic heart disease of native coronary artery without angina pectoris: Secondary | ICD-10-CM | POA: Diagnosis not present

## 2018-08-29 DIAGNOSIS — N183 Chronic kidney disease, stage 3 (moderate): Secondary | ICD-10-CM | POA: Diagnosis not present

## 2018-08-29 DIAGNOSIS — I482 Chronic atrial fibrillation, unspecified: Secondary | ICD-10-CM | POA: Diagnosis not present

## 2018-08-30 ENCOUNTER — Observation Stay (HOSPITAL_BASED_OUTPATIENT_CLINIC_OR_DEPARTMENT_OTHER): Payer: Medicare Other

## 2018-08-30 ENCOUNTER — Inpatient Hospital Stay (HOSPITAL_COMMUNITY)
Admission: EM | Admit: 2018-08-30 | Discharge: 2018-09-06 | DRG: 603 | Disposition: A | Discharge status: Skilled Nursing Facility | Payer: Medicare Other | Attending: Family Medicine | Admitting: Family Medicine

## 2018-08-30 ENCOUNTER — Emergency Department (HOSPITAL_COMMUNITY): Payer: Medicare Other

## 2018-08-30 ENCOUNTER — Encounter (HOSPITAL_COMMUNITY): Payer: Self-pay

## 2018-08-30 DIAGNOSIS — Z993 Dependence on wheelchair: Secondary | ICD-10-CM

## 2018-08-30 DIAGNOSIS — I10 Essential (primary) hypertension: Secondary | ICD-10-CM | POA: Diagnosis not present

## 2018-08-30 DIAGNOSIS — Z79899 Other long term (current) drug therapy: Secondary | ICD-10-CM

## 2018-08-30 DIAGNOSIS — M810 Age-related osteoporosis without current pathological fracture: Secondary | ICD-10-CM | POA: Diagnosis present

## 2018-08-30 DIAGNOSIS — I959 Hypotension, unspecified: Secondary | ICD-10-CM | POA: Diagnosis not present

## 2018-08-30 DIAGNOSIS — D509 Iron deficiency anemia, unspecified: Secondary | ICD-10-CM | POA: Diagnosis present

## 2018-08-30 DIAGNOSIS — Z933 Colostomy status: Secondary | ICD-10-CM

## 2018-08-30 DIAGNOSIS — L0390 Cellulitis, unspecified: Secondary | ICD-10-CM | POA: Diagnosis not present

## 2018-08-30 DIAGNOSIS — Z7901 Long term (current) use of anticoagulants: Secondary | ICD-10-CM | POA: Diagnosis not present

## 2018-08-30 DIAGNOSIS — E039 Hypothyroidism, unspecified: Secondary | ICD-10-CM | POA: Diagnosis present

## 2018-08-30 DIAGNOSIS — R52 Pain, unspecified: Secondary | ICD-10-CM | POA: Diagnosis not present

## 2018-08-30 DIAGNOSIS — I130 Hypertensive heart and chronic kidney disease with heart failure and stage 1 through stage 4 chronic kidney disease, or unspecified chronic kidney disease: Secondary | ICD-10-CM | POA: Diagnosis present

## 2018-08-30 DIAGNOSIS — N179 Acute kidney failure, unspecified: Secondary | ICD-10-CM | POA: Diagnosis not present

## 2018-08-30 DIAGNOSIS — F0390 Unspecified dementia without behavioral disturbance: Secondary | ICD-10-CM | POA: Diagnosis present

## 2018-08-30 DIAGNOSIS — Z66 Do not resuscitate: Secondary | ICD-10-CM | POA: Diagnosis present

## 2018-08-30 DIAGNOSIS — Z79890 Hormone replacement therapy (postmenopausal): Secondary | ICD-10-CM

## 2018-08-30 DIAGNOSIS — F329 Major depressive disorder, single episode, unspecified: Secondary | ICD-10-CM | POA: Diagnosis present

## 2018-08-30 DIAGNOSIS — L538 Other specified erythematous conditions: Secondary | ICD-10-CM | POA: Diagnosis not present

## 2018-08-30 DIAGNOSIS — M7989 Other specified soft tissue disorders: Secondary | ICD-10-CM

## 2018-08-30 DIAGNOSIS — Z90710 Acquired absence of both cervix and uterus: Secondary | ICD-10-CM

## 2018-08-30 DIAGNOSIS — L03115 Cellulitis of right lower limb: Secondary | ICD-10-CM

## 2018-08-30 DIAGNOSIS — I5032 Chronic diastolic (congestive) heart failure: Secondary | ICD-10-CM | POA: Diagnosis present

## 2018-08-30 DIAGNOSIS — I480 Paroxysmal atrial fibrillation: Secondary | ICD-10-CM | POA: Diagnosis present

## 2018-08-30 DIAGNOSIS — I2510 Atherosclerotic heart disease of native coronary artery without angina pectoris: Secondary | ICD-10-CM | POA: Diagnosis present

## 2018-08-30 DIAGNOSIS — Z87891 Personal history of nicotine dependence: Secondary | ICD-10-CM

## 2018-08-30 DIAGNOSIS — Z7951 Long term (current) use of inhaled steroids: Secondary | ICD-10-CM

## 2018-08-30 DIAGNOSIS — N183 Chronic kidney disease, stage 3 (moderate): Secondary | ICD-10-CM | POA: Diagnosis present

## 2018-08-30 DIAGNOSIS — K5900 Constipation, unspecified: Secondary | ICD-10-CM | POA: Diagnosis present

## 2018-08-30 DIAGNOSIS — F419 Anxiety disorder, unspecified: Secondary | ICD-10-CM | POA: Diagnosis present

## 2018-08-30 DIAGNOSIS — R0902 Hypoxemia: Secondary | ICD-10-CM | POA: Diagnosis not present

## 2018-08-30 DIAGNOSIS — Z9049 Acquired absence of other specified parts of digestive tract: Secondary | ICD-10-CM

## 2018-08-30 DIAGNOSIS — E860 Dehydration: Secondary | ICD-10-CM | POA: Diagnosis present

## 2018-08-30 LAB — CBC WITH DIFFERENTIAL/PLATELET
Abs Immature Granulocytes: 0.4 10*3/uL — ABNORMAL HIGH (ref 0.00–0.07)
BASOS PCT: 0 %
Basophils Absolute: 0.1 10*3/uL (ref 0.0–0.1)
EOS PCT: 2 %
Eosinophils Absolute: 0.2 10*3/uL (ref 0.0–0.5)
HCT: 32 % — ABNORMAL LOW (ref 36.0–46.0)
Hemoglobin: 9.8 g/dL — ABNORMAL LOW (ref 12.0–15.0)
Immature Granulocytes: 3 %
Lymphocytes Relative: 6 %
Lymphs Abs: 1 10*3/uL (ref 0.7–4.0)
MCH: 28.7 pg (ref 26.0–34.0)
MCHC: 30.6 g/dL (ref 30.0–36.0)
MCV: 93.6 fL (ref 80.0–100.0)
MONO ABS: 0.9 10*3/uL (ref 0.1–1.0)
Monocytes Relative: 6 %
Neutro Abs: 13 10*3/uL — ABNORMAL HIGH (ref 1.7–7.7)
Neutrophils Relative %: 83 %
PLATELETS: 282 10*3/uL (ref 150–400)
RBC: 3.42 MIL/uL — AB (ref 3.87–5.11)
RDW: 18.1 % — ABNORMAL HIGH (ref 11.5–15.5)
WBC: 15.6 10*3/uL — AB (ref 4.0–10.5)
nRBC: 0 % (ref 0.0–0.2)

## 2018-08-30 LAB — BASIC METABOLIC PANEL
Anion gap: 9 (ref 5–15)
BUN: 25 mg/dL — AB (ref 8–23)
CHLORIDE: 106 mmol/L (ref 98–111)
CO2: 24 mmol/L (ref 22–32)
Calcium: 8.5 mg/dL — ABNORMAL LOW (ref 8.9–10.3)
Creatinine, Ser: 1.58 mg/dL — ABNORMAL HIGH (ref 0.44–1.00)
GFR calc Af Amer: 32 mL/min — ABNORMAL LOW (ref >60)
GFR, EST NON AFRICAN AMERICAN: 28 mL/min — AB (ref >60)
Glucose, Bld: 101 mg/dL — ABNORMAL HIGH (ref 70–99)
Potassium: 4.2 mmol/L (ref 3.5–5.1)
SODIUM: 139 mmol/L (ref 135–145)

## 2018-08-30 LAB — MRSA PCR SCREENING: MRSA by PCR: NEGATIVE

## 2018-08-30 MED ORDER — VANCOMYCIN HCL IN DEXTROSE 1-5 GM/200ML-% IV SOLN: 1000.0000 mg | INTRAVENOUS | Status: DC

## 2018-08-30 MED ORDER — TRAZODONE HCL 50 MG PO TABS
25.0000 mg | ORAL_TABLET | Freq: Every day | ORAL | Status: DC
  Administered 2018-08-30 – 2018-09-05 (×7): 25 mg via ORAL
  Filled 2018-08-30 (×7): qty 1

## 2018-08-30 MED ORDER — ALBUTEROL SULFATE (2.5 MG/3ML) 0.083% IN NEBU: 3.0000 mL | INHALATION_SOLUTION | Freq: Four times a day (QID) | RESPIRATORY_TRACT | Status: DC | PRN

## 2018-08-30 MED ORDER — SODIUM CHLORIDE 0.9 % IV BOLUS: 500.0000 mL | Freq: Once | INTRAVENOUS | Status: DC

## 2018-08-30 MED ORDER — MELATONIN 5 MG PO TABS
5.0000 mg | ORAL_TABLET | Freq: Every day | ORAL | Status: DC
  Filled 2018-08-30: qty 1

## 2018-08-30 MED ORDER — MELATONIN 3 MG PO TABS
3.0000 mg | ORAL_TABLET | Freq: Every day | ORAL | Status: DC
  Administered 2018-08-30 – 2018-09-05 (×6): 3 mg via ORAL
  Filled 2018-08-30 (×8): qty 1

## 2018-08-30 MED ORDER — VANCOMYCIN HCL IN DEXTROSE 1-5 GM/200ML-% IV SOLN
1000.0000 mg | Freq: Once | INTRAVENOUS | Status: AC
  Administered 2018-08-30: 1000 mg via INTRAVENOUS
  Filled 2018-08-30: qty 200

## 2018-08-30 MED ORDER — LEVOTHYROXINE SODIUM 75 MCG PO TABS
75.0000 ug | ORAL_TABLET | Freq: Every day | ORAL | Status: DC
  Administered 2018-08-31 – 2018-09-06 (×7): 75 ug via ORAL
  Filled 2018-08-30 (×7): qty 1

## 2018-08-30 MED ORDER — APIXABAN 2.5 MG PO TABS
2.5000 mg | ORAL_TABLET | Freq: Two times a day (BID) | ORAL | Status: DC
  Administered 2018-08-30 – 2018-09-06 (×15): 2.5 mg via ORAL
  Filled 2018-08-30 (×15): qty 1

## 2018-08-30 MED ORDER — POTASSIUM CHLORIDE 20 MEQ PO PACK: 20.0000 meq | PACK | Freq: Two times a day (BID) | ORAL | Status: DC

## 2018-08-30 MED ORDER — SERTRALINE HCL 50 MG PO TABS
25.0000 mg | ORAL_TABLET | Freq: Every day | ORAL | Status: DC
  Administered 2018-08-30 – 2018-09-06 (×8): 25 mg via ORAL
  Filled 2018-08-30 (×8): qty 1

## 2018-08-30 MED ORDER — SENNOSIDES-DOCUSATE SODIUM 8.6-50 MG PO TABS
2.0000 | ORAL_TABLET | Freq: Two times a day (BID) | ORAL | Status: DC
  Administered 2018-08-30 – 2018-09-06 (×15): 2 via ORAL
  Filled 2018-08-30 (×15): qty 2

## 2018-08-30 MED ORDER — METOPROLOL TARTRATE 12.5 MG HALF TABLET
12.5000 mg | ORAL_TABLET | Freq: Two times a day (BID) | ORAL | Status: DC
  Administered 2018-08-30 – 2018-09-06 (×15): 12.5 mg via ORAL
  Filled 2018-08-30 (×15): qty 1

## 2018-08-30 MED ORDER — VANCOMYCIN HCL 500 MG IV SOLR
500.0000 mg | INTRAVENOUS | Status: DC
  Administered 2018-08-31 – 2018-09-01 (×2): 500 mg via INTRAVENOUS
  Filled 2018-08-30 (×2): qty 500

## 2018-08-30 MED ORDER — FENTANYL CITRATE (PF) 100 MCG/2ML IJ SOLN
50.0000 ug | Freq: Once | INTRAMUSCULAR | Status: AC
  Administered 2018-08-30: 50 ug via INTRAVENOUS
  Filled 2018-08-30: qty 2

## 2018-08-30 MED ORDER — FERROUS SULFATE 325 (65 FE) MG PO TABS
325.0000 mg | ORAL_TABLET | ORAL | Status: DC
  Administered 2018-08-31 – 2018-09-05 (×3): 325 mg via ORAL
  Filled 2018-08-30 (×3): qty 1

## 2018-08-30 MED ORDER — OXYCODONE HCL 5 MG PO TABS
5.0000 mg | ORAL_TABLET | ORAL | Status: DC | PRN
  Administered 2018-08-30 – 2018-09-05 (×19): 5 mg via ORAL
  Filled 2018-08-30 (×19): qty 1

## 2018-08-30 MED ORDER — SODIUM CHLORIDE 0.9 % IV SOLN
INTRAVENOUS | Status: DC
  Administered 2018-08-30: 10:00:00 via INTRAVENOUS

## 2018-08-30 NOTE — H&P (Signed)
History and Physical  [name] [name] XLK:[id number] DOB: June 18, 1923 DOA: 08/30/2018  Referring physician: Dr Kathrynn Humble  PCP: Lajean Manes, MD  Outpatient Specialists: None Patient coming from: ALF  Chief Complaint: Severe right ankle and leg pain   HPI: [name] [name] is a 84 y.o. female with medical history significant for, paroxysmal A. fib on Eliquis, chronic diastolic CHF, status post colostomy who presented to [facility] ED from assisted living facility with complaints of worsening right lower extremity wound associated with severe pain.  Endorses she scratched her lateral right leg 3 or 4 days ago and in the last 24 hours noted some purulent discharge, redness, and severe tenderness with gentle touch.  She denies associated fevers or chills, nausea, vomiting, chest pain or dyspnea.  Patient is wheelchair-bound and minimally active.  ED Course: Vital signs essentially unremarkable. Lab studies remarkable for leukocytosis with WBC 15 K, mild drop in her baseline hemoglobin from 10.5 to 9.8, and elevated creatinine which appears to be at her baseline.  Review of Systems: Review of systems as noted in the HPI. All other systems reviewed and are negative.   Past Medical History:  Diagnosis Date  . Anemia   . Arthritis   . CAD (coronary artery disease)   . Cancer [facility])    Endometrial  . Congenital disease    Left forearm  . Dementia ([place])   . Diverticulosis   . History of degenerative disc disease    Both knees  . Hypertension   . Osteoporosis    osteopenia  . Thyroid disease    Hypothyroidism   Past Surgical History:  Procedure Laterality Date  . ABDOMINAL HYSTERECTOMY    . BOWEL RESECTION    . COLOSTOMY  [id number]  . COLOSTOMY    . OTHER SURGICAL HISTORY    . ROTATOR CUFF REPAIR  1990s    Social History:  reports that she has quit smoking. She has never used smokeless tobacco. She reports that she does not drink alcohol or use drugs.   Allergies  Allergen Reactions  . Sulfa  Antibiotics Hives  . Penicillins Itching and Rash    Tolerates ceftriaxone and cefdinir  Has patient had a PCN reaction causing immediate rash, facial/tongue/throat swelling, SOB or lightheadedness with hypotension: Yes Has patient had a PCN reaction causing severe rash involving mucus membranes or skin necrosis: Unk Has patient had a PCN reaction that required hospitalization: Unk Has patient had a PCN reaction occurring within the last 10 years: Unk If all of the above answers are "NO", then may proceed with Cephalosporin use.     Family History  Problem Relation Age of Onset  . Breast cancer Sister   . Heart disease Mother   . Heart attack Father       Prior to Admission medications   Medication Sig Start Date End Date Taking? Authorizing Provider  albuterol (PROVENTIL HFA;VENTOLIN HFA) 108 (90 Base) MCG/ACT inhaler Inhale 2 puffs into the lungs every 6 (six) hours as needed for wheezing or shortness of breath. 10/17/16  Yes Eber Jones, MD  albuterol (PROVENTIL) (2.5 MG/3ML) 0.083% nebulizer solution Take 2.5 mg by nebulization every 8 (eight) hours as needed for wheezing or shortness of breath.   Yes [provider]  apixaban (ELIQUIS) 2.5 MG TABS tablet Take 1 tablet (2.5 mg total) by mouth 2 (two) times daily. 07/08/18  Yes Samuella Cota, MD  benzonatate (TESSALON) 100 MG capsule Take 100 mg by mouth 3 (three) times daily as needed for  cough.   Yes [provider]  bumetanide (BUMEX) 1 MG tablet Take 1 mg by mouth 2 (two) times daily.    Yes [provider]  ferrous sulfate 325 (65 FE) MG tablet Take 325 mg by mouth every Monday, Wednesday, and Friday.   Yes [provider]  fluticasone (FLONASE) 50 MCG/ACT nasal spray Place 2 sprays into both nostrils daily.   Yes [provider]  guaiFENesin-dextromethorphan (ROBITUSSIN DM) 100-10 MG/5ML syrup Take 5 mLs by mouth every 4 (four) hours as needed for cough. 10/17/16  Yes  Eber Jones, MD  HYDROcodone-acetaminophen (NORCO/VICODIN) 5-325 MG tablet Take 1 tablet by mouth 3 (three) times daily.   Yes [provider]  HYDROcodone-acetaminophen (NORCO/VICODIN) 5-325 MG tablet Take 1 tablet by mouth 3 (three) times daily as needed for moderate pain.   Yes [provider]  ipratropium-albuterol (DUONEB) 0.5-2.5 (3) MG/3ML SOLN Take 3 mLs by nebulization 3 (three) times daily. 10/17/16  Yes Eber Jones, MD  levofloxacin (LEVAQUIN) 500 MG tablet Take 500 mg by mouth daily.   Yes [provider]  levothyroxine (SYNTHROID, LEVOTHROID) 75 MCG tablet Take 75 mcg by mouth daily before breakfast.   Yes [provider]  loratadine (CLARITIN) 10 MG tablet Take 10 mg by mouth daily.   Yes [provider]  Melatonin 5 MG TABS Take 5 mg by mouth at bedtime.    Yes [provider]  metoprolol tartrate (LOPRESSOR) 25 MG tablet Take 1 tablet (25 mg total) by mouth 2 (two) times daily. Patient taking differently: Take 12.5 mg by mouth 2 (two) times daily.  07/08/18  Yes Samuella Cota, MD  Multiple Vitamin (MULTIVITAMIN WITH MINERALS) TABS tablet Take 1 tablet by mouth daily. 07/09/18  Yes Samuella Cota, MD  Polyethyl Glyc-Propyl Glyc PF (SYSTANE ULTRA PF) 0.4-0.3 % SOLN Place 1 drop into both eyes 4 (four) times daily as needed (for dry eyes).    Yes [provider]  Polyethyl Glycol-Propyl Glycol (SYSTANE ULTRA) 0.4-0.3 % SOLN Place 1 drop into both eyes 4 (four) times daily.   Yes [provider]  polyethylene glycol (MIRALAX / GLYCOLAX) packet Take 17 g by mouth daily.   Yes [provider]  potassium chloride SA (K-DUR,KLOR-CON) 20 MEQ tablet Take 40 mEq by mouth 2 (two) times daily.   Yes [provider]  sertraline (ZOLOFT) 25 MG tablet Take 25 mg by mouth daily.   Yes [provider]  Skin Protectants, Misc. (EUCERIN) cream Apply 1 application topically daily as  needed for dry skin.    Yes [provider]  Skin Protectants, Misc. (EUCERIN) cream Apply 1 application topically 3 (three) times daily. To left side of neck   Yes [provider]  traZODone (DESYREL) 50 MG tablet Take 25 mg by mouth at bedtime.  02/26/18  Yes [provider]  feeding supplement, ENSURE ENLIVE, (ENSURE ENLIVE) LIQD Take 237 mLs by mouth 3 (three) times daily between meals. Patient not taking: Reported on 08/30/2018 07/08/18   Samuella Cota, MD  potassium chloride (KLOR-CON) 20 MEQ packet Take 20 mEq by mouth 2 (two) times daily.     [provider]  traMADol (ULTRAM) 50 MG tablet Take 1 tablet (50 mg total) by mouth every 12 (twelve) hours as needed for moderate pain. Patient not taking: Reported on 08/30/2018 07/08/18   Samuella Cota, MD    Physical Exam: BP 138/60 (BP Location: Right Arm)   Pulse 92  Temp 97.7 F (36.5 C) (Oral)   Resp 19   Ht 5\' 6"  (1.676 m)   Wt 76.6 kg   SpO2 92%   BMI 27.26 kg/m   . General: 84 y.o. year-old female well developed well nourished in no acute distress.  Alert and oriented x3. . Cardiovascular: Regular rate and rhythm with no rubs or gallops.  No thyromegaly or JVD noted.  No lower extremity edema. 2/4 pulses in all 4 extremities. Marland Kitchen Respiratory: Clear to auscultation with no wheezes or rales. Good inspiratory effort. . Abdomen: Soft nontender nondistended with normal bowel sounds x4 quadrants.  Left colostomy bag in place. . Muskuloskeletal: No cyanosis, clubbing or edema noted bilaterally . Neuro: CN II-XII intact, strength, sensation, reflexes . Skin: Lateral right leg with a wound with purulent discharge.  Right medial side of ankle with what appears to be hematoma.  Redness noted from below knee down to dorsum portion of right foot. Marland Kitchen Psychiatry: Judgement and insight appear normal. Mood is appropriate for condition and setting          Labs on Admission:  Basic Metabolic  Panel: Recent Labs  Lab 08/30/18 0417  NA 139  K 4.2  CL 106  CO2 24  GLUCOSE 101*  BUN 25*  CREATININE 1.58*  CALCIUM 8.5*   Liver Function Tests: No results for input(s): AST, ALT, ALKPHOS, BILITOT, PROT, ALBUMIN in the last 168 hours. No results for input(s): LIPASE, AMYLASE in the last 168 hours. No results for input(s): AMMONIA in the last 168 hours. CBC: Recent Labs  Lab 08/30/18 0417  WBC 15.6*  NEUTROABS 13.0*  HGB 9.8*  HCT 32.0*  MCV 93.6  PLT 282   Cardiac Enzymes: No results for input(s): CKTOTAL, CKMB, CKMBINDEX, TROPONINI in the last 168 hours.  BNP (last 3 results) Recent Labs    03/05/18 1658 06/30/18 1354  BNP 796.7* 80.4    ProBNP (last 3 results) No results for input(s): PROBNP in the last 8760 hours.  CBG: No results for input(s): GLUCAP in the last 168 hours.  Radiological Exams on Admission: Dg Tibia/fibula Right  Result Date: 08/30/2018 CLINICAL DATA:  Infection, swelling.  Rule out soft tissue gas. EXAM: RIGHT TIBIA AND FIBULA - 2 VIEW COMPARISON:  None. FINDINGS: No soft tissue gas.  No acute or suspicious osseous finding. IMPRESSION: Negative. Electronically Signed   By: Franki Cabot M.D.   On: 08/30/2018 06:55    EKG: I independently viewed the EKG done and my findings are as followed: No EKG done at the time of this visit.  Assessment/Plan Present on Admission: . Cellulitis  Active Problems:   Cellulitis  Right lower extremity cellulitis/wound Endorses scratching her lateral right leg 3 or 4 days ago with redness and extreme tenderness with gentle touch in the last 1 day Patient is on Eliquis for history of paroxysmal A. fib She is wheelchair-bound Will obtain ultrasound to rule out DVT although less likely Start IV vancomycin for suspected cellulitis Monitor fever curve and WBC Repeat CBC in the morning  Paroxysmal A. fib Currently rate controlled On Eliquis for CVA prophylaxis Continue to monitor  CKD 3 Appears  to be close to her baseline creatinine of 1.4 with GFR of 31 Creatinine is 1.58 today Avoid nephrotoxic agents/dehydration/hypotension Start gentle IV fluid hydration normal saline at 75 cc/h Repeat BMP in the morning  Hypothyroidism Continue levothyroxine  Iron deficiency anemia Continue iron supplement  Chronic depression/anxiety Continue Zoloft  Ambulatory dysfunction, wheelchair-bound Unclear etiology, denies  history of CVA or knees arthritis PT to assess Fall precautions  Chronic diastolic CHF Last 2D echo revealed preserved LVEF 60-65% No acute issues She is euvolemic Strict I's and O's and daily weights     DVT prophylaxis: Eliquis  Code Status: DNR  Family Communication: None at bedside  Disposition Plan: Admit to [place] called: None  Admission status: Observation status    Kayleen Memos MD [facility] Pager [phone]  If 7PM-7AM, please contact night-coverage [email or web address] Password [facility]  08/30/2018, 7:48 AM

## 2018-08-30 NOTE — Progress Notes (Signed)
VASCULAR LAB PRELIMINARY  PRELIMINARY  PRELIMINARY  PRELIMINARY  Right lower extremity venous duplex completed.    Preliminary report:  There is no obvious evidence of DVT or SVT noted in the visualized veins of the right lower extremity.  Luci Bellucci, RVT 08/30/2018, 2:09 PM

## 2018-08-30 NOTE — ED Notes (Signed)
Patient transported to X-ray 

## 2018-08-30 NOTE — ED Triage Notes (Signed)
Pt. Coming from skilled nursing facility with reports of left lower leg pain. Pt. States she thinks she scratched her leg and caused an infection. Pt. Was taking antibiotics at her facility but pt. States her pain has continued to get worse. Pt. Has hx. Of dementia. Pt. Is not on thinners. Pt. Able to answer questions appropriately.

## 2018-08-30 NOTE — ED Provider Notes (Signed)
[facility] EMERGENCY DEPARTMENT Provider Note   CSN: [id number] Arrival date & time: 08/30/18  0301     History   Chief Complaint No chief complaint on file.   HPI [name] [name] is a 84 y.o. female.  HPI 84 year old female with history of coronary artery disease, A. fib on Eliquis comes in with chief complaint of right leg pain. Patient reports that she injured her right lower extremity whilst scratching few days ago.  Soon after she started having pain and redness to her leg and saw her primary care doctor who prescribed her with Levaquin.  Patient has been taking Levaquin for at least the last 3 days now, and despite taking the medicine her pain has gotten worse and the redness has become more prominent.  Patient denies any nausea, vomiting, fevers, chills.  She has no history of DVT, PE and has been taking Eliquis for A. Fib.  Patient resides at a nursing home.  Daughter is at the bedside.  Past Medical History:  Diagnosis Date  . Anemia   . Arthritis   . CAD (coronary artery disease)   . Cancer [facility])    Endometrial  . Congenital disease    Left forearm  . Dementia ([place])   . Diverticulosis   . History of degenerative disc disease    Both knees  . Hypertension   . Osteoporosis    osteopenia  . Thyroid disease    Hypothyroidism    Patient Active Problem List   Diagnosis Date Noted  . Cellulitis 08/30/2018  . Goals of care, counseling/discussion   . Palliative care by specialist   . Acute hypoxemic respiratory failure ([place]) 07/04/2018  . Acute on chronic diastolic CHF (congestive heart failure) ([place]) 07/04/2018  . Hyperkalemia 07/04/2018  . Demand ischemia ([place]) 07/04/2018  . Atrial fibrillation with rapid ventricular response ([place]) 06/30/2018  . Heart failure, diastolic, acute on chronic ([place]) 03/05/2018  . Acute congestive heart failure ([place])   . HTN (hypertension) 10/12/2016  . COPD exacerbation ([place])   . CAP (community acquired  pneumonia) 10/11/2016  . Colostomy in place [facility]) 12/10/2013    Past Surgical History:  Procedure Laterality Date  . ABDOMINAL HYSTERECTOMY    . BOWEL RESECTION    . COLOSTOMY  [id number]  . COLOSTOMY    . OTHER SURGICAL HISTORY    . ROTATOR CUFF REPAIR  1990s     OB History   None      Home Medications    Prior to Admission medications   Medication Sig Start Date End Date Taking? Authorizing Provider  albuterol (PROVENTIL HFA;VENTOLIN HFA) 108 (90 Base) MCG/ACT inhaler Inhale 2 puffs into the lungs every 6 (six) hours as needed for wheezing or shortness of breath. 10/17/16  Yes Eber Jones, MD  albuterol (PROVENTIL) (2.5 MG/3ML) 0.083% nebulizer solution Take 2.5 mg by nebulization every 8 (eight) hours as needed for wheezing or shortness of breath.   Yes [provider]  apixaban (ELIQUIS) 2.5 MG TABS tablet Take 1 tablet (2.5 mg total) by mouth 2 (two) times daily. 07/08/18  Yes Samuella Cota, MD  benzonatate (TESSALON) 100 MG capsule Take 100 mg by mouth 3 (three) times daily as needed for cough.   Yes [provider]  bumetanide (BUMEX) 1 MG tablet Take 1 mg by mouth 2 (two) times daily.    Yes [provider]  ferrous sulfate 325 (65 FE) MG tablet Take 325 mg by mouth every Monday, Wednesday,  and Friday.   Yes [provider]  fluticasone (FLONASE) 50 MCG/ACT nasal spray Place 2 sprays into both nostrils daily.   Yes [provider]  guaiFENesin-dextromethorphan (ROBITUSSIN DM) 100-10 MG/5ML syrup Take 5 mLs by mouth every 4 (four) hours as needed for cough. 10/17/16  Yes Eber Jones, MD  HYDROcodone-acetaminophen (NORCO/VICODIN) 5-325 MG tablet Take 1 tablet by mouth 3 (three) times daily.   Yes [provider]  HYDROcodone-acetaminophen (NORCO/VICODIN) 5-325 MG tablet Take 1 tablet by mouth 3 (three) times daily as needed for moderate pain.   Yes [provider]  ipratropium-albuterol (DUONEB)  0.5-2.5 (3) MG/3ML SOLN Take 3 mLs by nebulization 3 (three) times daily. 10/17/16  Yes Eber Jones, MD  levofloxacin (LEVAQUIN) 500 MG tablet Take 500 mg by mouth daily.   Yes [provider]  levothyroxine (SYNTHROID, LEVOTHROID) 75 MCG tablet Take 75 mcg by mouth daily before breakfast.   Yes [provider]  loratadine (CLARITIN) 10 MG tablet Take 10 mg by mouth daily.   Yes [provider]  Melatonin 5 MG TABS Take 5 mg by mouth at bedtime.    Yes [provider]  metoprolol tartrate (LOPRESSOR) 25 MG tablet Take 1 tablet (25 mg total) by mouth 2 (two) times daily. Patient taking differently: Take 12.5 mg by mouth 2 (two) times daily.  07/08/18  Yes Samuella Cota, MD  Multiple Vitamin (MULTIVITAMIN WITH MINERALS) TABS tablet Take 1 tablet by mouth daily. 07/09/18  Yes Samuella Cota, MD  Polyethyl Glyc-Propyl Glyc PF (SYSTANE ULTRA PF) 0.4-0.3 % SOLN Place 1 drop into both eyes 4 (four) times daily as needed (for dry eyes).    Yes [provider]  Polyethyl Glycol-Propyl Glycol (SYSTANE ULTRA) 0.4-0.3 % SOLN Place 1 drop into both eyes 4 (four) times daily.   Yes [provider]  polyethylene glycol (MIRALAX / GLYCOLAX) packet Take 17 g by mouth daily.   Yes [provider]  potassium chloride SA (K-DUR,KLOR-CON) 20 MEQ tablet Take 40 mEq by mouth 2 (two) times daily.   Yes [provider]  sertraline (ZOLOFT) 25 MG tablet Take 25 mg by mouth daily.   Yes [provider]  Skin Protectants, Misc. (EUCERIN) cream Apply 1 application topically daily as needed for dry skin.    Yes [provider]  Skin Protectants, Misc. (EUCERIN) cream Apply 1 application topically 3 (three) times daily. To left side of neck   Yes [provider]  traZODone (DESYREL) 50 MG tablet Take 25 mg by mouth at bedtime.  02/26/18  Yes [provider]  feeding supplement, ENSURE ENLIVE, (ENSURE ENLIVE)  LIQD Take 237 mLs by mouth 3 (three) times daily between meals. Patient not taking: Reported on 08/30/2018 07/08/18   Samuella Cota, MD  potassium chloride (KLOR-CON) 20 MEQ packet Take 20 mEq by mouth 2 (two) times daily.     [provider]  traMADol (ULTRAM) 50 MG tablet Take 1 tablet (50 mg total) by mouth every 12 (twelve) hours as needed for moderate pain. Patient not taking: Reported on 08/30/2018 07/08/18   Samuella Cota, MD    Family History Family History  Problem Relation Age of Onset  . Breast cancer Sister   . Heart disease Mother   . Heart attack Father     Social History Social History   Tobacco Use  . Smoking status: Former Research scientist (life sciences)  . Smokeless tobacco: Never Used  Substance Use Topics  .  Alcohol use: No  . Drug use: No     Allergies   Sulfa antibiotics and Penicillins   Review of Systems Review of Systems  Constitutional: Positive for activity change.  Skin: Positive for rash.  Allergic/Immunologic: Negative for immunocompromised state.  Hematological: Bruises/bleeds easily.  All other systems reviewed and are negative.    Physical Exam Updated Vital Signs BP (!) 128/53   Pulse 82   Temp 97.9 F (36.6 C) (Oral)   Ht 5\' 6"  (1.676 m)   Wt 78 kg   SpO2 98%   BMI 27.76 kg/m   Physical Exam  Constitutional: She is oriented to person, place, and time. She appears well-developed.  HENT:  Head: Normocephalic and atraumatic.  Eyes: EOM are normal.  Neck: Normal range of motion. Neck supple.  Cardiovascular: Normal rate.  Pulmonary/Chest: Effort normal.  Abdominal: Bowel sounds are normal.  Musculoskeletal: She exhibits tenderness.  Neurological: She is alert and oriented to person, place, and time.  Skin: Skin is warm and dry. Rash noted. There is erythema.  Patient has focal right lower extremity calor, rubor and the lower. She has an ulcer at the proximal part of her right lower extremity, with purulent appearing drainage.   Patient also has a blister towards her medial malleoli.  Nursing note and vitals reviewed.    ED Treatments / Results  Labs (all labs ordered are listed, but only abnormal results are displayed) Labs Reviewed  CBC WITH DIFFERENTIAL/PLATELET - Abnormal; Notable for the following components:      Result Value   WBC 15.6 (*)    RBC 3.42 (*)    Hemoglobin 9.8 (*)    HCT 32.0 (*)    RDW 18.1 (*)    Neutro Abs 13.0 (*)    Abs Immature Granulocytes 0.40 (*)    All other components within normal limits  BASIC METABOLIC PANEL - Abnormal; Notable for the following components:   Glucose, Bld 101 (*)    BUN 25 (*)    Creatinine, Ser 1.58 (*)    Calcium 8.5 (*)    GFR calc non Af Amer 28 (*)    GFR calc Af Amer 32 (*)    All other components within normal limits    EKG None  Radiology No results found.  Procedures Procedures (including critical care time)  Medications Ordered in ED Medications  fentaNYL (SUBLIMAZE) injection 50 mcg (50 mcg Intravenous Given 08/30/18 0438)  vancomycin (VANCOCIN) IVPB 1000 mg/200 mL premix (1,000 mg Intravenous New Bag/Given 08/30/18 0438)     Initial Impression / Assessment and Plan / ED Course  I have reviewed the triage vital signs and the nursing notes.  Pertinent labs & imaging results that were available during my care of the patient were reviewed by me and considered in my medical decision making (see chart for details).     84 year old female with history of CHF, A. fib on DOAC, comes in with chief complaint of right lower extremity pain.  It appears that patient has cellulitis based on her clinical exam, and she has failed outpatient therapy.  Spoke with the family about giving her IV vancomycin now and discharging her with doxycycline plus Keflex -and for her to return to the ER if her symptoms are still progressing.  Family is not comfortable with the plan as patient has been hurting a lot and not able to function well.  They prefer  that patient be admitted as an observation status.  Hospitalist is has been  called. Based on my evaluation and the information available to me patient is not septic.  Her creatinine is at baseline elevated level.  Final Clinical Impressions(s) / ED Diagnoses   Final diagnoses:  Cellulitis of right lower extremity    ED Discharge Orders    None       Varney Biles, MD 08/30/18 [id number]

## 2018-08-30 NOTE — Progress Notes (Signed)
Pharmacy Antibiotic Note  [name] [name] is a 84 y.o. female admitted on 08/30/2018 with severe right ankle and leg pain.  Pharmacy has been consulted for Vancomycin dosing for suspectged cellulitis RLE wound. Received  Vanc 1g I x1 11/28 @ 0430   Plan: Vancomycin 500 mg IV q24h start tomorrow AM Monitor clinical status, renal function and culture results daily.  Check Vancomycin trough at steady state if vancomycin continues.   Height: 5\' 6"  (167.6 cm) Weight: 168 lb 14 oz (76.6 kg) IBW/kg (Calculated) : 59.3  Temp (24hrs), Avg:97.8 F (36.6 C), Min:97.7 F (36.5 C), Max:97.9 F (36.6 C)  Recent Labs  Lab 08/30/18 0417  WBC 15.6*  CREATININE 1.58*    Estimated Creatinine Clearance: 23.2 mL/min (A) (by C-G formula based on SCr of 1.58 mg/dL (H)).    Allergies  Allergen Reactions  . Sulfa Antibiotics Hives  . Penicillins Itching and Rash    Tolerates ceftriaxone and cefdinir  Has patient had a PCN reaction causing immediate rash, facial/tongue/throat swelling, SOB or lightheadedness with hypotension: Yes Has patient had a PCN reaction causing severe rash involving mucus membranes or skin necrosis: Unk Has patient had a PCN reaction that required hospitalization: Unk Has patient had a PCN reaction occurring within the last 10 years: Unk If all of the above answers are "NO", then may proceed with Cephalosporin use.     Antimicrobials this admission: Vanc 11/28>>  Dose adjustments this admission:   Microbiology results: No cultures   Thank you for allowing pharmacy to be a part of this patient's care. Nicole Cella, RPh Clinical Pharmacist Please check AMION for all [facility] phone numbers After 10:00 PM, call [place] [phone] 08/30/2018 10:37 AM

## 2018-08-31 ENCOUNTER — Inpatient Hospital Stay (HOSPITAL_COMMUNITY): Payer: Medicare Other

## 2018-08-31 ENCOUNTER — Other Ambulatory Visit: Payer: Self-pay

## 2018-08-31 DIAGNOSIS — I5033 Acute on chronic diastolic (congestive) heart failure: Secondary | ICD-10-CM | POA: Diagnosis not present

## 2018-08-31 DIAGNOSIS — L03115 Cellulitis of right lower limb: Principal | ICD-10-CM

## 2018-08-31 DIAGNOSIS — E039 Hypothyroidism, unspecified: Secondary | ICD-10-CM | POA: Diagnosis present

## 2018-08-31 DIAGNOSIS — Z993 Dependence on wheelchair: Secondary | ICD-10-CM | POA: Diagnosis not present

## 2018-08-31 DIAGNOSIS — D509 Iron deficiency anemia, unspecified: Secondary | ICD-10-CM | POA: Diagnosis present

## 2018-08-31 DIAGNOSIS — Z79899 Other long term (current) drug therapy: Secondary | ICD-10-CM | POA: Diagnosis not present

## 2018-08-31 DIAGNOSIS — Z90710 Acquired absence of both cervix and uterus: Secondary | ICD-10-CM | POA: Diagnosis not present

## 2018-08-31 DIAGNOSIS — Z933 Colostomy status: Secondary | ICD-10-CM | POA: Diagnosis not present

## 2018-08-31 DIAGNOSIS — J449 Chronic obstructive pulmonary disease, unspecified: Secondary | ICD-10-CM | POA: Diagnosis not present

## 2018-08-31 DIAGNOSIS — M810 Age-related osteoporosis without current pathological fracture: Secondary | ICD-10-CM | POA: Diagnosis present

## 2018-08-31 DIAGNOSIS — Z79890 Hormone replacement therapy (postmenopausal): Secondary | ICD-10-CM | POA: Diagnosis not present

## 2018-08-31 DIAGNOSIS — I2510 Atherosclerotic heart disease of native coronary artery without angina pectoris: Secondary | ICD-10-CM | POA: Diagnosis present

## 2018-08-31 DIAGNOSIS — I4891 Unspecified atrial fibrillation: Secondary | ICD-10-CM | POA: Diagnosis not present

## 2018-08-31 DIAGNOSIS — R52 Pain, unspecified: Secondary | ICD-10-CM | POA: Diagnosis not present

## 2018-08-31 DIAGNOSIS — N179 Acute kidney failure, unspecified: Secondary | ICD-10-CM | POA: Diagnosis present

## 2018-08-31 DIAGNOSIS — L0390 Cellulitis, unspecified: Secondary | ICD-10-CM | POA: Diagnosis not present

## 2018-08-31 DIAGNOSIS — K5900 Constipation, unspecified: Secondary | ICD-10-CM | POA: Diagnosis not present

## 2018-08-31 DIAGNOSIS — M5136 Other intervertebral disc degeneration, lumbar region: Secondary | ICD-10-CM | POA: Diagnosis not present

## 2018-08-31 DIAGNOSIS — M1380 Other specified arthritis, unspecified site: Secondary | ICD-10-CM | POA: Diagnosis not present

## 2018-08-31 DIAGNOSIS — F419 Anxiety disorder, unspecified: Secondary | ICD-10-CM | POA: Diagnosis present

## 2018-08-31 DIAGNOSIS — M6281 Muscle weakness (generalized): Secondary | ICD-10-CM | POA: Diagnosis not present

## 2018-08-31 DIAGNOSIS — Z7901 Long term (current) use of anticoagulants: Secondary | ICD-10-CM | POA: Diagnosis not present

## 2018-08-31 DIAGNOSIS — Z7951 Long term (current) use of inhaled steroids: Secondary | ICD-10-CM | POA: Diagnosis not present

## 2018-08-31 DIAGNOSIS — D649 Anemia, unspecified: Secondary | ICD-10-CM | POA: Diagnosis not present

## 2018-08-31 DIAGNOSIS — R600 Localized edema: Secondary | ICD-10-CM | POA: Diagnosis not present

## 2018-08-31 DIAGNOSIS — N183 Chronic kidney disease, stage 3 (moderate): Secondary | ICD-10-CM | POA: Diagnosis present

## 2018-08-31 DIAGNOSIS — R5381 Other malaise: Secondary | ICD-10-CM | POA: Diagnosis not present

## 2018-08-31 DIAGNOSIS — R110 Nausea: Secondary | ICD-10-CM | POA: Diagnosis not present

## 2018-08-31 DIAGNOSIS — Z7401 Bed confinement status: Secondary | ICD-10-CM | POA: Diagnosis not present

## 2018-08-31 DIAGNOSIS — Z87891 Personal history of nicotine dependence: Secondary | ICD-10-CM | POA: Diagnosis not present

## 2018-08-31 DIAGNOSIS — Z66 Do not resuscitate: Secondary | ICD-10-CM | POA: Diagnosis present

## 2018-08-31 DIAGNOSIS — I5032 Chronic diastolic (congestive) heart failure: Secondary | ICD-10-CM | POA: Diagnosis present

## 2018-08-31 DIAGNOSIS — E860 Dehydration: Secondary | ICD-10-CM | POA: Diagnosis present

## 2018-08-31 DIAGNOSIS — Z9049 Acquired absence of other specified parts of digestive tract: Secondary | ICD-10-CM | POA: Diagnosis not present

## 2018-08-31 DIAGNOSIS — E876 Hypokalemia: Secondary | ICD-10-CM | POA: Diagnosis not present

## 2018-08-31 DIAGNOSIS — M2550 Pain in unspecified joint: Secondary | ICD-10-CM | POA: Diagnosis not present

## 2018-08-31 DIAGNOSIS — Z111 Encounter for screening for respiratory tuberculosis: Secondary | ICD-10-CM | POA: Diagnosis not present

## 2018-08-31 DIAGNOSIS — E440 Moderate protein-calorie malnutrition: Secondary | ICD-10-CM | POA: Diagnosis not present

## 2018-08-31 DIAGNOSIS — F0390 Unspecified dementia without behavioral disturbance: Secondary | ICD-10-CM | POA: Diagnosis present

## 2018-08-31 DIAGNOSIS — I130 Hypertensive heart and chronic kidney disease with heart failure and stage 1 through stage 4 chronic kidney disease, or unspecified chronic kidney disease: Secondary | ICD-10-CM | POA: Diagnosis present

## 2018-08-31 DIAGNOSIS — I480 Paroxysmal atrial fibrillation: Secondary | ICD-10-CM | POA: Diagnosis present

## 2018-08-31 DIAGNOSIS — I10 Essential (primary) hypertension: Secondary | ICD-10-CM | POA: Diagnosis not present

## 2018-08-31 DIAGNOSIS — F329 Major depressive disorder, single episode, unspecified: Secondary | ICD-10-CM | POA: Diagnosis present

## 2018-08-31 DIAGNOSIS — J069 Acute upper respiratory infection, unspecified: Secondary | ICD-10-CM | POA: Diagnosis not present

## 2018-08-31 DIAGNOSIS — S91001A Unspecified open wound, right ankle, initial encounter: Secondary | ICD-10-CM | POA: Diagnosis not present

## 2018-08-31 DIAGNOSIS — I509 Heart failure, unspecified: Secondary | ICD-10-CM | POA: Diagnosis not present

## 2018-08-31 LAB — BASIC METABOLIC PANEL
Anion gap: 8 (ref 5–15)
BUN: 20 mg/dL (ref 8–23)
CHLORIDE: 110 mmol/L (ref 98–111)
CO2: 22 mmol/L (ref 22–32)
Calcium: 8.4 mg/dL — ABNORMAL LOW (ref 8.9–10.3)
Creatinine, Ser: 1.35 mg/dL — ABNORMAL HIGH (ref 0.44–1.00)
GFR calc Af Amer: 39 mL/min — ABNORMAL LOW (ref >60)
GFR calc non Af Amer: 34 mL/min — ABNORMAL LOW (ref >60)
Glucose, Bld: 107 mg/dL — ABNORMAL HIGH (ref 70–99)
Potassium: 4 mmol/L (ref 3.5–5.1)
Sodium: 140 mmol/L (ref 135–145)

## 2018-08-31 MED ORDER — LACTATED RINGERS IV SOLN
INTRAVENOUS | Status: AC
  Administered 2018-08-31: 20:00:00 via INTRAVENOUS

## 2018-08-31 NOTE — Discharge Instructions (Addendum)
1)You are taking Eliquis/apixaban for blood thinner so Avoid ibuprofen/Advil/Aleve/Motrin/Etodolac/Goody Powders/Naproxen/BC powders/Meloxicam/Diclofenac/Indomethacin and other Nonsteroidal anti-inflammatory medications as these will make you more likely to bleed and can cause stomach ulcers, can also cause Kidney problems.  2) wound care consult at skilled nursing facility please 3) wound dressings as advised by wound care specialist--Wound care consult and recommendation noted below as follows: Xeroform gauze Kellie Simmering [phone]) over the ankle. Secure with kerlex. Change daily; moisten with saline prior to removal to prevent stripping of skin 4) CBC and BMP blood test on Monday, 09/10/2018   Information on my medicine - ELIQUIS (apixaban)  Why was Eliquis prescribed for you? Eliquis was prescribed for you to reduce the risk of a blood clot forming that can cause a stroke if you have a medical condition called atrial fibrillation (a type of irregular heartbeat).  What do You need to know about Eliquis ? Take your Eliquis TWICE DAILY - one tablet in the morning and one tablet in the evening with or without food. If you have difficulty swallowing the tablet whole please discuss with your pharmacist how to take the medication safely.  Take Eliquis exactly as prescribed by your doctor and DO NOT stop taking Eliquis without talking to the doctor who prescribed the medication.  Stopping may increase your risk of developing a stroke.  Refill your prescription before you run out.  After discharge, you should have regular check-up appointments with your healthcare provider that is prescribing your Eliquis.  In the future your dose may need to be changed if your kidney function or weight changes by a significant amount or as you get older.  What do you do if you miss a dose? If you miss a dose, take it as soon as you remember on the same day and resume taking twice daily.  Do not take more than one  dose of ELIQUIS at the same time to make up a missed dose.  Important Safety Information A possible side effect of Eliquis is bleeding. You should call your healthcare provider right away if you experience any of the following: ? Bleeding from an injury or your nose that does not stop. ? Unusual colored urine (red or dark brown) or unusual colored stools (red or black). ? Unusual bruising for unknown reasons. ? A serious fall or if you hit your head (even if there is no bleeding).  Some medicines may interact with Eliquis and might increase your risk of bleeding or clotting while on Eliquis. To help avoid this, consult your healthcare provider or pharmacist prior to using any new prescription or non-prescription medications, including herbals, vitamins, non-steroidal anti-inflammatory drugs (NSAIDs) and supplements.  This website has more information on Eliquis (apixaban): http://www.eliquis.com/eliquis/home   1)You are taking Eliquis/apixaban for blood thinner so Avoid ibuprofen/Advil/Aleve/Motrin/Etodolac/Goody Powders/Naproxen/BC powders/Meloxicam/Diclofenac/Indomethacin and other Nonsteroidal anti-inflammatory medications as these will make you more likely to bleed and can cause stomach ulcers, can also cause Kidney problems.  2) wound care consult at skilled nursing facility please 3) wound dressings as advised by wound care specialist--Wound care consult and recommendation noted below as follows: Xeroform gauze Kellie Simmering [phone]) over the ankle. Secure with kerlex. Change daily; moisten with saline prior to removal to prevent stripping of skin 4) CBC and BMP blood test on Monday, 09/10/2018

## 2018-08-31 NOTE — Evaluation (Signed)
Physical Therapy Evaluation Patient Details Name: [name] [name] MRN: [id number] DOB: May 29, 1923 Today's Date: 08/31/2018   History of Present Illness  Pt is a 84 y/o female admitted secondary to increased redness and swelling in RLE, thought to be secondary to RLE cellulitis. RLE imaging negative for DVT. PM includes CAD, CKD, dementia, a fib, dCHF, s/p colostomy.   Clinical Impression  Pt admitted secondary to problem above with deficits below. Pt would not move RLE secondary to increased pain. Pt complaining of back pain, so rolled from side to side with mod A to attempt repositioning, however, pt requesting to return to back. Pt refusing further mobility. Feel pt will require increased assist at d/c, and unsure if ALF staff can provide increased assist. Will continue to follow acutely to maximize functional mobility independence and safety.     Follow Up Recommendations SNF;Supervision/Assistance - 24 hour(unless ALF can provide increased support)    Equipment Recommendations  None recommended by PT    Recommendations for Other Services       Precautions / Restrictions Precautions Precautions: Fall Restrictions Weight Bearing Restrictions: No      Mobility  Bed Mobility Overal bed mobility: Needs Assistance Bed Mobility: Rolling Rolling: Mod assist         General bed mobility comments: Pt complaining about back pain, so performed rolling from side to side to attempt repositioning, however, pt complaining of pain, so requesting to return to back. Elevated knees to help with positioning. Pt refusing to sit up on side of bed.   Transfers                    Ambulation/Gait                Stairs            Wheelchair Mobility    Modified Rankin (Stroke Patients Only)       Balance                                             Pertinent Vitals/Pain Pain Assessment: Faces Faces Pain Scale: Hurts whole lot Pain Location: RLE and  back  Pain Descriptors / Indicators: Grimacing;Guarding Pain Intervention(s): Limited activity within patient's tolerance;Monitored during session;Repositioned    Home Living Family/patient expects to be discharged to:: Assisted living               Home Equipment: Wheelchair - manual      Prior Function Level of Independence: Needs assistance   Gait / Transfers Assistance Needed: Pt reports 2 people have to help her perform stand pivot to WC.   ADL's / Homemaking Assistance Needed: Requires assist with ADL tasks         Hand Dominance        Extremity/Trunk Assessment   Upper Extremity Assessment Upper Extremity Assessment: LUE deficits/detail LUE Deficits / Details: Pt with LUE prosthetic.     Lower Extremity Assessment Lower Extremity Assessment: RLE deficits/detail;LLE deficits/detail RLE Deficits / Details: Pt would not try and move RLE secondary to pain. Able to perform limited ROM with ankle pumps.  LLE Deficits / Details: Able to perform heel slide and ankle pump, but only limited ROM.        Communication   Communication: No difficulties  Cognition Arousal/Alertness: Awake/alert Behavior During Therapy: Anxious Overall Cognitive Status: No family/caregiver present to determine baseline  cognitive functioning                                 General Comments: Pt very adamant that PT not move RLE.       General Comments General comments (skin integrity, edema, etc.): No caregiver present     Exercises     Assessment/Plan    PT Assessment Patient needs continued PT services  PT Problem List Decreased strength;Decreased balance;Decreased mobility;Decreased knowledge of use of DME;Decreased knowledge of precautions;Pain       PT Treatment Interventions DME instruction;Functional mobility training;Therapeutic activities;Therapeutic exercise;Balance training;Wheelchair mobility training;Patient/family education    PT Goals (Current  goals can be found in the Care Plan section)  Acute Rehab PT Goals Patient Stated Goal: to decrease pain PT Goal Formulation: With patient Time For Goal Achievement: 09/14/18 Potential to Achieve Goals: Fair    Frequency Min 2X/week   Barriers to discharge        Co-evaluation               AM-PAC PT "6 Clicks" Mobility  Outcome Measure Help needed turning from your back to your side while in a flat bed without using bedrails?: A Lot Help needed moving from lying on your back to sitting on the side of a flat bed without using bedrails?: A Lot Help needed moving to and from a bed to a chair (including a wheelchair)?: Total Help needed standing up from a chair using your arms (e.g., wheelchair or bedside chair)?: Total Help needed to walk in hospital room?: Total Help needed climbing 3-5 steps with a railing? : Total 6 Click Score: 8    End of Session   Activity Tolerance: Patient limited by pain Patient left: in bed;with call bell/phone within reach;with bed alarm set Nurse Communication: Patient requests pain meds;Mobility status PT Visit Diagnosis: Difficulty in walking, not elsewhere classified (R26.2);Muscle weakness (generalized) (M62.81);Unsteadiness on feet (R26.81)    Time: 1130-1141 PT Time Calculation (min) (ACUTE ONLY): 11 min   Charges:   PT Evaluation $PT Eval Moderate Complexity: 1 Mod          Leighton Ruff, PT, DPT  Acute Rehabilitation Services  Pager: [phone] Office: [phone]   Rudean Hitt 08/31/2018, 12:31 PM

## 2018-08-31 NOTE — Progress Notes (Addendum)
PROGRESS NOTE    [name] [name]  TMH:[id number] DOB: [birth year] DOA: 08/30/2018 PCP: Lajean Manes, MD )  Brief Narrative: Per HPI [name] [name] is a 84 y.o. female with medical history significant for, paroxysmal A. fib on Eliquis, chronic diastolic CHF, status post colostomy who presented to [facility] ED from assisted living facility with complaints of worsening right lower extremity wound associated with severe pain.  Endorses she scratched her lateral right leg 3 or 4 days ago and in the last 24 hours noted some purulent discharge, redness, and severe tenderness with gentle touch.  She denies associated fevers or chills, nausea, vomiting, chest pain or dyspnea.  Patient is wheelchair-bound and minimally active.  Assessment & Plan:   Active Problems:   Cellulitis   Right lower extremity cellulitis  Wound Sounds like this occurred after scratching She continues to complain of significant TTP [place] negative for DVT Plain films negative for soft tissue gas CT of right lower extremity (without contrast) with subcutaneous edema about the ankle worse on medial side (negative for abscess, osteomyelitis, or myositis Her DP pulses are palpable Vancomycin Afebrile  Wound c/s, appreciate recs  Paroxysmal A. fib Currently rate controlled On Eliquis for CVA prophylaxis Continue to monitor.  Follow renal function closely.  CKD 3 Appears to be close to her baseline creatinine of 1.4 with GFR of 31 Follow  Continue gentle hydration  Hypothyroidism Continue levothyroxine  Iron deficiency anemia Continue iron supplement  Chronic depression/anxiety Continue Zoloft  Ambulatory dysfunction, wheelchair-bound Unclear etiology, denies history of CVA or knees arthritis PT to assess -> recommending SNF at this point Fall precautions  Chronic diastolic CHF Last 2D echo revealed preserved LVEF 60-65% Euvolemic, follow with gentle hydration  DVT prophylaxis: apixiban Code Status: DNR Family  Communication: none at bedside Disposition Plan: pending improvement.  Inpatient given continued need for IV abx in setting of failure of outpatient antibiotics (levaquin x3 days per EDP note).   Consultants:   none  Procedures:  LE [place] Summary: Right: There is no evidence of deep vein thrombosis in the lower extremity. However, portions of this examination were limited- see technologist comments above.There is no evidence of superficial venous thrombosis. Left: No evidence of common femoral vein obstruction.   Antimicrobials:  Anti-infectives (From admission, onward)   Start     Dose/Rate Route Frequency Ordered Stop   08/31/18 0600  vancomycin (VANCOCIN) 500 mg in sodium chloride 0.9 % 100 mL IVPB     500 mg 100 mL/hr over 60 Minutes Intravenous Every 24 hours 08/30/18 1029     08/30/18 0815  vancomycin (VANCOCIN) IVPB 1000 mg/200 mL premix  Status:  Discontinued     1,000 mg 200 mL/hr over 60 Minutes Intravenous Every 24 hours 08/30/18 0811 08/30/18 0852   08/30/18 0415  vancomycin (VANCOCIN) IVPB 1000 mg/200 mL premix     1,000 mg 200 mL/hr over 60 Minutes Intravenous  Once 08/30/18 0413 08/30/18 0538         Subjective: C/o persisent LE pain.  Objective: Vitals:   08/30/18 1617 08/30/18 2224 08/31/18 0546 08/31/18 0813  BP: 130/69 (!) 146/80 (!) 151/83 (!) 149/55  Pulse: 90 91 81 79  Resp: 19 18 20 16   Temp: 98.7 F (37.1 C) 98.9 F (37.2 C) 98.2 F (36.8 C) 98.6 F (37 C)  TempSrc: Oral Oral Oral Oral  SpO2: 92% 90% 92% 90%  Weight:      Height:        Intake/Output Summary (Last 24 hours)  at 08/31/2018 1743 Last data filed at 08/31/2018 1330 Gross per 24 hour  Intake 1129.62 ml  Output 600 ml  Net 529.62 ml   Filed Weights   08/30/18 0311 08/30/18 0657  Weight: 78 kg 76.6 kg    Examination:  General exam: Appears calm and comfortable  Respiratory system: Clear to auscultation. Respiratory effort normal. Cardiovascular system: S1 & S2 heard,  RRR. Gastrointestinal system: Abdomen is nondistended, soft and nontender.Colostomy Central nervous system: Alert and oriented. No focal neurological deficits. Extremities: Right leg with ulceration to lateral aspect of proximal lower leg as well as dusky blistering skin over right medial malleolus.  Erythema noted across lower leg.  Skin: as above Psychiatry: Judgement and insight appear normal. Mood & affect appropriate.   Data Reviewed: I have personally reviewed following labs and imaging studies  CBC: Recent Labs  Lab 08/30/18 0417  WBC 15.6*  NEUTROABS 13.0*  HGB 9.8*  HCT 32.0*  MCV 93.6  PLT 854   Basic Metabolic Panel: Recent Labs  Lab 08/30/18 0417  NA 139  K 4.2  CL 106  CO2 24  GLUCOSE 101*  BUN 25*  CREATININE 1.58*  CALCIUM 8.5*   GFR: Estimated Creatinine Clearance: 23.2 mL/min (A) (by C-G formula based on SCr of 1.58 mg/dL (H)). Liver Function Tests: No results for input(s): AST, ALT, ALKPHOS, BILITOT, PROT, ALBUMIN in the last 168 hours. No results for input(s): LIPASE, AMYLASE in the last 168 hours. No results for input(s): AMMONIA in the last 168 hours. Coagulation Profile: No results for input(s): INR, PROTIME in the last 168 hours. Cardiac Enzymes: No results for input(s): CKTOTAL, CKMB, CKMBINDEX, TROPONINI in the last 168 hours. BNP (last 3 results) No results for input(s): PROBNP in the last 8760 hours. HbA1C: No results for input(s): HGBA1C in the last 72 hours. CBG: No results for input(s): GLUCAP in the last 168 hours. Lipid Profile: No results for input(s): CHOL, HDL, LDLCALC, TRIG, CHOLHDL, LDLDIRECT in the last 72 hours. Thyroid Function Tests: No results for input(s): TSH, T4TOTAL, FREET4, T3FREE, THYROIDAB in the last 72 hours. Anemia Panel: No results for input(s): VITAMINB12, FOLATE, FERRITIN, TIBC, IRON, RETICCTPCT in the last 72 hours. Sepsis Labs: No results for input(s): PROCALCITON, LATICACIDVEN in the last 168  hours.  Recent Results (from the past 240 hour(s))  MRSA PCR Screening     Status: None   Collection Time: 08/30/18  8:09 AM  Result Value Ref Range Status   MRSA by PCR NEGATIVE NEGATIVE Final    Comment:        The GeneXpert MRSA Assay (FDA approved for NASAL specimens only), is one component of a comprehensive MRSA colonization surveillance program. It is not intended to diagnose MRSA infection nor to guide or monitor treatment for MRSA infections. Performed at [facility], [place] [address] [place], [place] [address]          Radiology Studies: Dg Tibia/fibula Right  Result Date: 08/30/2018 CLINICAL DATA:  Infection, swelling.  Rule out soft tissue gas. EXAM: RIGHT TIBIA AND FIBULA - 2 VIEW COMPARISON:  None. FINDINGS: No soft tissue gas.  No acute or suspicious osseous finding. IMPRESSION: Negative. Electronically Signed   By: Franki Cabot M.D.   On: 08/30/2018 06:55   Vas [place] Lower Extremity Venous (dvt)  Result Date: 08/30/2018  Lower Venous Study Indications: Pain, Swelling, Erythema, and cellulitis.  Limitations: Pain with touch. Comparison Study: No prior study on file for comparison Performing Technologist: Sharion Dove RVS  Examination Guidelines: A complete evaluation includes B-mode imaging, spectral Doppler, color Doppler, and power Doppler as needed of all accessible portions of each vessel. Bilateral testing is considered an integral part of a complete examination. Limited examinations for reoccurring indications may be performed as noted.  Right Venous Findings: +---------+---------------+---------+-----------+----------+-------+          CompressibilityPhasicitySpontaneityPropertiesSummary +---------+---------------+---------+-----------+----------+-------+ CFV      Full                                                 +---------+---------------+---------+-----------+----------+-------+ SFJ      Full                                                  +---------+---------------+---------+-----------+----------+-------+ FV Prox  Full                                                 +---------+---------------+---------+-----------+----------+-------+ FV Mid   Full                                                 +---------+---------------+---------+-----------+----------+-------+ FV DistalFull                                                 +---------+---------------+---------+-----------+----------+-------+ PFV      Full                                                 +---------+---------------+---------+-----------+----------+-------+ POP                     Yes      Yes                          +---------+---------------+---------+-----------+----------+-------+ GSV      Full                                                 +---------+---------------+---------+-----------+----------+-------+  Right Technical Findings: Not visualized segments include posterior tibial and peroneal veins.  Left Venous Findings: +---+---------------+---------+-----------+----------+-------+    CompressibilityPhasicitySpontaneityPropertiesSummary +---+---------------+---------+-----------+----------+-------+ CFVFull           Yes      Yes                          +---+---------------+---------+-----------+----------+-------+    Summary: Right: There is no evidence of deep vein thrombosis in the lower extremity. However, portions of this examination were limited- see technologist comments above.There is no evidence of superficial venous thrombosis.  Left: No evidence of common femoral vein obstruction.  *See table(s) above for measurements and observations. Electronically signed by Servando Snare MD on 08/30/2018 at 5:59:12 PM.    Final         Scheduled Meds: . apixaban  2.5 mg Oral BID  . ferrous sulfate  325 mg Oral Q M,W,F  . levothyroxine  75 mcg Oral QAC breakfast  . Melatonin  3 mg Oral QHS  . metoprolol  tartrate  12.5 mg Oral BID  . senna-docusate  2 tablet Oral BID  . sertraline  25 mg Oral Daily  . traZODone  25 mg Oral QHS   Continuous Infusions: . lactated ringers    . sodium chloride    . vancomycin 500 mg (08/31/18 0600)     LOS: 0 days    Time spent: over 30 min    Fayrene Helper, MD [facility] Pager [phone]  If 7PM-7AM, please contact night-coverage [email or web address] Password [email or web address] 08/31/2018, 5:43 PM

## 2018-08-31 NOTE — Consult Note (Signed)
[place] Nurse wound consult note Patient examined in [facility] [phone].  No family present. Reason for Consult: "lateral RLE wound with purulence" Wound type: Right lateral lower leg wound history indicates trauma injury prior to hospital arrival.  This wound measures 2.8 cm x 2.2 cm and is 100% yellow slough.  There is a small amount of drainage on the Xeroform gauze and foam dressing.  The right medial ankle has an area that measures 8.3 cm x 7.5 cm, is darkened, resembles blood under the skin.  The area has scattered pin hole openings through which serosanginous fluid is weeping.  There is no dressing over this area.  The patient cannot tell me how long this area has been there, or how it started.  The leg from just below the knee to the foot is erythematous.  It is very painful.  A recent ultrasound of the leg does not have indications of a DVT.  I do not palpate a dorsalis pedis pulse in the foot.  Plan of care for this area:  Xeroform gauze Kellie Simmering [phone]) over the ankle. Secure with kerlex.  Change daily; moisten with saline prior to removal to prevent stripping of skin.  Monitor the wound area(s) for worsening of condition such as: Signs/symptoms of infection,  Increase in size,  Development of or worsening of odor, Development of pain, or increased pain at the affected locations.  Notify the medical team if any of these develop.  Thank you for the consult.  Discussed plan of care with the patient.  [place] nurse will not follow at this time.  Please re-consult the [place] team if needed.  Val Riles, RN, MSN, CWOCN, CNS-BC, pager [phone]

## 2018-09-01 LAB — CBC
HCT: 30.8 % — ABNORMAL LOW (ref 36.0–46.0)
HEMOGLOBIN: 8.9 g/dL — AB (ref 12.0–15.0)
MCH: 27.4 pg (ref 26.0–34.0)
MCHC: 28.9 g/dL — ABNORMAL LOW (ref 30.0–36.0)
MCV: 94.8 fL (ref 80.0–100.0)
Platelets: 299 10*3/uL (ref 150–400)
RBC: 3.25 MIL/uL — ABNORMAL LOW (ref 3.87–5.11)
RDW: 17.9 % — ABNORMAL HIGH (ref 11.5–15.5)
WBC: 12.2 10*3/uL — ABNORMAL HIGH (ref 4.0–10.5)
nRBC: 0 % (ref 0.0–0.2)

## 2018-09-01 LAB — COMPREHENSIVE METABOLIC PANEL
ALT: 18 U/L (ref 0–44)
ANION GAP: 12 (ref 5–15)
AST: 23 U/L (ref 15–41)
Albumin: 1.9 g/dL — ABNORMAL LOW (ref 3.5–5.0)
Alkaline Phosphatase: 68 U/L (ref 38–126)
BUN: 19 mg/dL (ref 8–23)
CO2: 21 mmol/L — ABNORMAL LOW (ref 22–32)
Calcium: 8.3 mg/dL — ABNORMAL LOW (ref 8.9–10.3)
Chloride: 105 mmol/L (ref 98–111)
Creatinine, Ser: 1.38 mg/dL — ABNORMAL HIGH (ref 0.44–1.00)
GFR calc Af Amer: 38 mL/min — ABNORMAL LOW (ref >60)
GFR calc non Af Amer: 33 mL/min — ABNORMAL LOW (ref >60)
Glucose, Bld: 82 mg/dL (ref 70–99)
Potassium: 4.1 mmol/L (ref 3.5–5.1)
SODIUM: 138 mmol/L (ref 135–145)
Total Bilirubin: 0.5 mg/dL (ref 0.3–1.2)
Total Protein: 5.8 g/dL — ABNORMAL LOW (ref 6.5–8.1)

## 2018-09-01 LAB — MAGNESIUM: Magnesium: 2.1 mg/dL (ref 1.7–2.4)

## 2018-09-01 MED ORDER — DOXYCYCLINE HYCLATE 100 MG PO TABS
100.0000 mg | ORAL_TABLET | Freq: Two times a day (BID) | ORAL | Status: DC
  Administered 2018-09-01 – 2018-09-06 (×11): 100 mg via ORAL
  Filled 2018-09-01 (×11): qty 1

## 2018-09-01 MED ORDER — ACETAMINOPHEN 325 MG PO TABS
650.0000 mg | ORAL_TABLET | Freq: Four times a day (QID) | ORAL | Status: DC
  Administered 2018-09-01 – 2018-09-06 (×21): 650 mg via ORAL
  Filled 2018-09-01 (×21): qty 2

## 2018-09-01 MED ORDER — CEFAZOLIN SODIUM-DEXTROSE 1-4 GM/50ML-% IV SOLN
1.0000 g | Freq: Two times a day (BID) | INTRAVENOUS | Status: DC
  Administered 2018-09-01 – 2018-09-06 (×11): 1 g via INTRAVENOUS
  Filled 2018-09-01 (×12): qty 50

## 2018-09-01 NOTE — Plan of Care (Signed)
Pain management & coping progressing.  Barriers - patient is hesitant to participate in ADLs but will when encouraged.

## 2018-09-01 NOTE — Progress Notes (Signed)
PROGRESS NOTE    [name] [name]  YPP:[id number] DOB: 1923-07-25 DOA: 08/30/2018 PCP: Lajean Manes, MD )  Brief Narrative: Per HPI [name] [name] is [name] [name] 84 y.o. female with medical history significant for, paroxysmal Andree Golphin. fib on Eliquis, chronic diastolic CHF, status post colostomy who presented to [facility] ED from assisted living facility with complaints of worsening right lower extremity wound associated with severe pain.  Endorses she scratched her lateral right leg 3 or 4 days ago and in the last 24 hours noted some purulent discharge, redness, and severe tenderness with gentle touch.  She denies associated fevers or chills, nausea, vomiting, chest pain or dyspnea.  Patient is wheelchair-bound and minimally active.  Assessment & Plan:   Active Problems:   Cellulitis   Right lower extremity cellulitis  Wound Sounds like this occurred after scratching She continues to complain of significant TTP.  APAP scheduled.  Continue oxycodone prn. [place] negative for DVT Plain films negative for soft tissue gas CT of right lower extremity (without contrast) with subcutaneous edema about the ankle worse on medial side (negative for abscess, osteomyelitis, or myositis) Her DP pulses are palpable Vancomycin -> narrow to ancef today, will add doxy for MRSA coverage Afebrile  Wound c/s, appreciate recs  Paroxysmal Deena Shaub. fib Currently rate controlled On Eliquis for CVA prophylaxis Continue to monitor.  stable  CKD 3 Appears to be close to her baseline creatinine of 1.4 with GFR of 31 Creatinine 1.38 today, stable Continue gentle hydration  Hypothyroidism Continue levothyroxine  Iron deficiency anemia Continue iron supplement Downtrending, likely in setting of IVF, follow daily CBC  Chronic depression/anxiety Continue Zoloft  Ambulatory dysfunction, wheelchair-bound Unclear etiology, denies history of CVA or knees arthritis PT to assess -> recommending SNF at this point Fall precautions  Chronic  diastolic CHF Last 2D echo revealed preserved LVEF 60-65% Euvolemic, follow with gentle hydration  DVT prophylaxis: apixiban Code Status: DNR Family Communication: none at bedside Disposition Plan: pending improvement.  Inpatient given continued need for IV abx in setting of failure of outpatient antibiotics (levaquin x3 days per EDP note).   Consultants:   none  Procedures:  LE [place] Summary: Right: There is no evidence of deep vein thrombosis in the lower extremity. However, portions of this examination were limited- see technologist comments above.There is no evidence of superficial venous thrombosis. Left: No evidence of common femoral vein obstruction.   Antimicrobials:  Anti-infectives (From admission, onward)   Start     Dose/Rate Route Frequency Ordered Stop   09/01/18 1300  doxycycline (VIBRA-TABS) tablet 100 mg     100 mg Oral Every 12 hours 09/01/18 1050     08/31/18 0600  vancomycin (VANCOCIN) 500 mg in sodium chloride 0.9 % 100 mL IVPB  Status:  Discontinued     500 mg 100 mL/hr over 60 Minutes Intravenous Every 24 hours 08/30/18 1029 09/01/18 1050   08/30/18 0815  vancomycin (VANCOCIN) IVPB 1000 mg/200 mL premix  Status:  Discontinued     1,000 mg 200 mL/hr over 60 Minutes Intravenous Every 24 hours 08/30/18 0811 08/30/18 0852   08/30/18 0415  vancomycin (VANCOCIN) IVPB 1000 mg/200 mL premix     1,000 mg 200 mL/hr over 60 Minutes Intravenous  Once 08/30/18 0413 08/30/18 0538         Subjective: C/o persisent LE pain. Doesn't feel too much better.  Objective: Vitals:   08/31/18 1950 09/01/18 0407 09/01/18 0407 09/01/18 0720  BP: (!) 153/63 (!) 141/69 (!) 141/69 (!) 146/72  Pulse:  81 87 87 82  Resp: 18 18 18 19   Temp: 98.5 F (36.9 C) (!) 97.5 F (36.4 C) (!) 97.5 F (36.4 C) 97.6 F (36.4 C)  TempSrc: Oral Oral Oral Oral  SpO2: 91% 91% 91% 95%  Weight: 78.2 kg     Height:        Intake/Output Summary (Last 24 hours) at 09/01/2018 1104 Last data  filed at 09/01/2018 0930 Gross per 24 hour  Intake 420 ml  Output -  Net 420 ml   Filed Weights   08/30/18 0311 08/30/18 0657 08/31/18 1950  Weight: 78 kg 76.6 kg 78.2 kg    Examination:  General: No acute distress. Cardiovascular: Heart sounds show Sanjuana Mruk regular rate, and rhythm Lungs: Clear to auscultation bilaterally  Abdomen: Soft, nontender, nondistended  Neurological: Alert and oriented 3. Moves all extremities 4. Cranial nerves II through XII grossly intact. Skin: RLE with ulceration to lateral aspect of lower leg with some purulence.  Medial aspect of knee with dusky sloughing skin. Psychiatric: Mood and affect are normal. Insight and judgment are appropriate.  Data Reviewed: I have personally reviewed following labs and imaging studies  CBC: Recent Labs  Lab 08/30/18 0417 09/01/18 0526  WBC 15.6* 12.2*  NEUTROABS 13.0*  --   HGB 9.8* 8.9*  HCT 32.0* 30.8*  MCV 93.6 94.8  PLT 282 353   Basic Metabolic Panel: Recent Labs  Lab 08/30/18 0417 08/31/18 1813 09/01/18 0526  NA 139 140 138  K 4.2 4.0 4.1  CL 106 110 105  CO2 24 22 21*  GLUCOSE 101* 107* 82  BUN 25* 20 19  CREATININE 1.58* 1.35* 1.38*  CALCIUM 8.5* 8.4* 8.3*  MG  --   --  2.1   GFR: Estimated Creatinine Clearance: 26.9 mL/min (Cace Osorto) (by C-G formula based on SCr of 1.38 mg/dL (H)). Liver Function Tests: Recent Labs  Lab 09/01/18 0526  AST 23  ALT 18  ALKPHOS 68  BILITOT 0.5  PROT 5.8*  ALBUMIN 1.9*   No results for input(s): LIPASE, AMYLASE in the last 168 hours. No results for input(s): AMMONIA in the last 168 hours. Coagulation Profile: No results for input(s): INR, PROTIME in the last 168 hours. Cardiac Enzymes: No results for input(s): CKTOTAL, CKMB, CKMBINDEX, TROPONINI in the last 168 hours. BNP (last 3 results) No results for input(s): PROBNP in the last 8760 hours. HbA1C: No results for input(s): HGBA1C in the last 72 hours. CBG: No results for input(s): GLUCAP in the last  168 hours. Lipid Profile: No results for input(s): CHOL, HDL, LDLCALC, TRIG, CHOLHDL, LDLDIRECT in the last 72 hours. Thyroid Function Tests: No results for input(s): TSH, T4TOTAL, FREET4, T3FREE, THYROIDAB in the last 72 hours. Anemia Panel: No results for input(s): VITAMINB12, FOLATE, FERRITIN, TIBC, IRON, RETICCTPCT in the last 72 hours. Sepsis Labs: No results for input(s): PROCALCITON, LATICACIDVEN in the last 168 hours.  Recent Results (from the past 240 hour(s))  MRSA PCR Screening     Status: None   Collection Time: 08/30/18  8:09 AM  Result Value Ref Range Status   MRSA by PCR NEGATIVE NEGATIVE Final    Comment:        The GeneXpert MRSA Assay (FDA approved for NASAL specimens only), is one component of Estoria Geary comprehensive MRSA colonization surveillance program. It is not intended to diagnose MRSA infection nor to guide or monitor treatment for MRSA infections. Performed at [facility], [place] [address], [place], [place] [address]  Radiology Studies: Ct Extremity Lower Right Wo Contrast  Result Date: 08/31/2018 CLINICAL DATA:  The patient scratch the lateral side of the right lower leg 3 or 4 days ago. Right lower extremity redness and swelling. EXAM: CT OF THE LOWER RIGHT EXTREMITY WITHOUT CONTRAST TECHNIQUE: Multidetector CT imaging of the right lower extremity was performed according to the standard protocol. COMPARISON:  None. FINDINGS: Bones/Joint/Cartilage No acute bony abnormality is identified. No periosteal reaction or bony destructive change. There is some patellofemoral osteoarthritis. Ligaments Suboptimally assessed by CT. Muscles and Tendons No intramuscular fluid collection or gas. Mild fatty atrophy of lower leg musculature is noted. Soft tissues No focal fluid collection, soft tissue gas or foreign body is identified. Subcutaneous edema is present about the ankle bilaterally, worse on the medial side. IMPRESSION: Subcutaneous edema about the  ankle is worse on the medial side and could be due to dependent change or cellulitis. Negative for abscess, osteomyelitis or myositis. Mild patellofemoral osteoarthritis. Electronically Signed   By: Inge Rise M.D.   On: 08/31/2018 18:55   Vas [place] Lower Extremity Venous (dvt)  Result Date: 08/30/2018  Lower Venous Study Indications: Pain, Swelling, Erythema, and cellulitis.  Limitations: Pain with touch. Comparison Study: No prior study on file for comparison Performing Technologist: Sharion Dove RVS  Examination Guidelines: Vicky Schleich complete evaluation includes B-mode imaging, spectral Doppler, color Doppler, and power Doppler as needed of all accessible portions of each vessel. Bilateral testing is considered an integral part of Tracey Stewart complete examination. Limited examinations for reoccurring indications may be performed as noted.  Right Venous Findings: +---------+---------------+---------+-----------+----------+-------+          CompressibilityPhasicitySpontaneityPropertiesSummary +---------+---------------+---------+-----------+----------+-------+ CFV      Full                                                 +---------+---------------+---------+-----------+----------+-------+ SFJ      Full                                                 +---------+---------------+---------+-----------+----------+-------+ FV Prox  Full                                                 +---------+---------------+---------+-----------+----------+-------+ FV Mid   Full                                                 +---------+---------------+---------+-----------+----------+-------+ FV DistalFull                                                 +---------+---------------+---------+-----------+----------+-------+ PFV      Full                                                 +---------+---------------+---------+-----------+----------+-------+  POP                     Yes      Yes                           +---------+---------------+---------+-----------+----------+-------+ GSV      Full                                                 +---------+---------------+---------+-----------+----------+-------+  Right Technical Findings: Not visualized segments include posterior tibial and peroneal veins.  Left Venous Findings: +---+---------------+---------+-----------+----------+-------+    CompressibilityPhasicitySpontaneityPropertiesSummary +---+---------------+---------+-----------+----------+-------+ CFVFull           Yes      Yes                          +---+---------------+---------+-----------+----------+-------+    Summary: Right: There is no evidence of deep vein thrombosis in the lower extremity. However, portions of this examination were limited- see technologist comments above.There is no evidence of superficial venous thrombosis. Left: No evidence of common femoral vein obstruction.  *See table(s) above for measurements and observations. Electronically signed by Servando Snare MD on 08/30/2018 at 5:59:12 PM.    Final         Scheduled Meds: . acetaminophen  650 mg Oral Q6H  . apixaban  2.5 mg Oral BID  . doxycycline  100 mg Oral Q12H  . ferrous sulfate  325 mg Oral Q M,W,F  . levothyroxine  75 mcg Oral QAC breakfast  . Melatonin  3 mg Oral QHS  . metoprolol tartrate  12.5 mg Oral BID  . senna-docusate  2 tablet Oral BID  . sertraline  25 mg Oral Daily  . traZODone  25 mg Oral QHS   Continuous Infusions: . lactated ringers 50 mL/hr at 08/31/18 2000  . sodium chloride       LOS: 1 day    Time spent: over 30 min    Fayrene Helper, MD [facility] Pager [phone]  If 7PM-7AM, please contact night-coverage [email or web address] Password [email or web address] 09/01/2018, 11:04 AM

## 2018-09-01 NOTE — Plan of Care (Signed)
Progressing towards goals

## 2018-09-01 NOTE — Progress Notes (Signed)
Pharmacy Antibiotic Note  [name] [name] is a 84 y.o. female admitted on 08/30/2018 with severe right ankle and leg pain. She has been on vanc for her cellulitis. Dr. Florene Glen change her to Ancef and doxy today. She has gotten ceftriaxone multiple times in the past.    Plan:   Ancef 1g IVq 12  Height: 5\' 6"  (167.6 cm) Weight: 172 lb 6.4 oz (78.2 kg) IBW/kg (Calculated) : 59.3  Temp (24hrs), Avg:97.8 F (36.6 C), Min:97.5 F (36.4 C), Max:98.5 F (36.9 C)  Recent Labs  Lab 08/30/18 0417 08/31/18 1813 09/01/18 0526  WBC 15.6*  --  12.2*  CREATININE 1.58* 1.35* 1.38*    Estimated Creatinine Clearance: 26.9 mL/min (A) (by C-G formula based on SCr of 1.38 mg/dL (H)).    Allergies  Allergen Reactions  . Sulfa Antibiotics Hives  . Penicillins Itching and Rash    Tolerates ceftriaxone and cefdinir  Has patient had a PCN reaction causing immediate rash, facial/tongue/throat swelling, SOB or lightheadedness with hypotension: Yes Has patient had a PCN reaction causing severe rash involving mucus membranes or skin necrosis: Unk Has patient had a PCN reaction that required hospitalization: Unk Has patient had a PCN reaction occurring within the last 10 years: Unk If all of the above answers are "NO", then may proceed with Cephalosporin use.     Antimicrobials this admission: Vanc 11/28>>11/30 Ancef 11/30>> Doxy 11/30>>  Dose adjustments this admission:   Microbiology results: MRSA PCR neg  Onnie Boer, PharmD, BCIDP, AAHIVP, CPP Infectious Disease Pharmacist 09/01/2018 11:35 AM

## 2018-09-02 DIAGNOSIS — I5032 Chronic diastolic (congestive) heart failure: Secondary | ICD-10-CM

## 2018-09-02 DIAGNOSIS — I10 Essential (primary) hypertension: Secondary | ICD-10-CM

## 2018-09-02 DIAGNOSIS — R5381 Other malaise: Secondary | ICD-10-CM

## 2018-09-02 LAB — BASIC METABOLIC PANEL
Anion gap: 10 (ref 5–15)
BUN: 17 mg/dL (ref 8–23)
CO2: 23 mmol/L (ref 22–32)
Calcium: 8.1 mg/dL — ABNORMAL LOW (ref 8.9–10.3)
Chloride: 105 mmol/L (ref 98–111)
Creatinine, Ser: 1.22 mg/dL — ABNORMAL HIGH (ref 0.44–1.00)
GFR calc Af Amer: 44 mL/min — ABNORMAL LOW (ref >60)
GFR, EST NON AFRICAN AMERICAN: 38 mL/min — AB (ref >60)
GLUCOSE: 91 mg/dL (ref 70–99)
POTASSIUM: 3.6 mmol/L (ref 3.5–5.1)
Sodium: 138 mmol/L (ref 135–145)

## 2018-09-02 LAB — CBC
HCT: 28.5 % — ABNORMAL LOW (ref 36.0–46.0)
Hemoglobin: 8.5 g/dL — ABNORMAL LOW (ref 12.0–15.0)
MCH: 27.8 pg (ref 26.0–34.0)
MCHC: 29.8 g/dL — ABNORMAL LOW (ref 30.0–36.0)
MCV: 93.1 fL (ref 80.0–100.0)
Platelets: 288 10*3/uL (ref 150–400)
RBC: 3.06 MIL/uL — ABNORMAL LOW (ref 3.87–5.11)
RDW: 17.5 % — ABNORMAL HIGH (ref 11.5–15.5)
WBC: 9.4 10*3/uL (ref 4.0–10.5)
nRBC: 0 % (ref 0.0–0.2)

## 2018-09-02 LAB — MAGNESIUM: Magnesium: 2 mg/dL (ref 1.7–2.4)

## 2018-09-02 MED ORDER — ONDANSETRON HCL 4 MG/2ML IJ SOLN
4.0000 mg | Freq: Four times a day (QID) | INTRAMUSCULAR | Status: DC | PRN
  Administered 2018-09-02: 4 mg via INTRAVENOUS
  Filled 2018-09-02: qty 2

## 2018-09-02 NOTE — Progress Notes (Signed)
PROGRESS NOTE    [name] [name]  ASN:[id number] DOB: 1923-05-31 DOA: 08/30/2018 PCP: Lajean Manes, MD   Brief Narrative:  84 year old with history of proximal atrial fibrillation on Eliquis, chronic diastolic CHF, status post cholecystectomy came to the hospital from assisted living with complains of worsening of the right lower extremity wound and swelling.  She was diagnosed with right lower extremity cellulitis and started on IV antibiotics.   Assessment & Plan:   Active Problems:   Cellulitis  Right lower extremity cellulitis; slowly improving Chronic deconditioning -Patient initially was on vancomycin which was changed to Ancef and doxycycline was added. -Continue this care.  PT is recommending skilled nursing facility therefore social worker has been consulted. -At baseline patient is mostly wheelchair-bound.  Would continue fall precautions -Appreciate wound care input -Lower externally Dopplers is negative for DVT  History of paroxysmal atrial fibrillation -Currently patient is rate controlled.  Continue Eliquis  Chronic diastolic congestive heart failure -Patient appears to be euvolemic.  CKD stage III -Creatinine appears to be at baseline around 1.4.  Hypothyroidism -Continue Synthroid  Iron deficiency anemia -Continue iron supplements.  History of depression and anxiety, chronic -Continue Zoloft   DVT prophylaxis: Eliquis Code Status: DNR Family Communication: None at bedside Disposition Plan: Continue IV antibiotics at this time.  Consultants:   None  Procedures:   None   Subjective: Feels slightly better in the right lower extremity and remains afebrile.  Review of Systems Otherwise negative except as per HPI, including: General: Denies fever, chills, night sweats or unintended weight loss. Resp: Denies cough, wheezing, shortness of breath. Cardiac: Denies chest pain, palpitations, orthopnea, paroxysmal nocturnal dyspnea. GI: Denies abdominal  pain, nausea, vomiting, diarrhea or constipation GU: Denies dysuria, frequency, hesitancy or incontinence MS: Denies muscle aches, joint pain or swelling Neuro: Denies headache, neurologic deficits (focal weakness, numbness, tingling), abnormal gait Psych: Denies anxiety, depression, SI/HI/AVH Skin: Denies new rashes or lesions ID: Denies sick contacts, exotic exposures, travel  Objective: Vitals:   09/01/18 1710 09/01/18 2021 09/02/18 0513 09/02/18 0740  BP: 130/65 (!) 150/60 (!) 142/74 (!) 150/48  Pulse: 79 72 79 79  Resp: 18 18 16 19   Temp: 98 F (36.7 C) 98.4 F (36.9 C) 98.6 F (37 C) 98.5 F (36.9 C)  TempSrc: Oral Oral  Oral  SpO2: 98% (!) 89% 92% 99%  Weight:      Height:        Intake/Output Summary (Last 24 hours) at 09/02/2018 1240 Last data filed at 09/02/2018 1937 Gross per 24 hour  Intake 3419.82 ml  Output 625 ml  Net 2794.82 ml   Filed Weights   08/30/18 0311 08/30/18 0657 08/31/18 1950  Weight: 78 kg 76.6 kg 78.2 kg    Examination:  General exam: Appears calm and comfortable, elderly appearing Respiratory system: Clear to auscultation. Respiratory effort normal. Cardiovascular system: S1 & S2 heard, RRR. No JVD, murmurs, rubs, gallops or clicks. No pedal edema. Gastrointestinal system: Abdomen is nondistended, soft and nontender. No organomegaly or masses felt. Normal bowel sounds heard. Central nervous system: Alert and oriented. No focal neurological deficits. Extremities: Symmetric 4 x 5 power. Skin: Right lower extremity dressing in place. Psychiatry: Judgement and insight appear normal. Mood & affect appropriate.     Data Reviewed:   CBC: Recent Labs  Lab 08/30/18 0417 09/01/18 0526 09/02/18 0351  WBC 15.6* 12.2* 9.4  NEUTROABS 13.0*  --   --   HGB 9.8* 8.9* 8.5*  HCT 32.0* 30.8* 28.5*  MCV 93.6  94.8 93.1  PLT 282 299 277   Basic Metabolic Panel: Recent Labs  Lab 08/30/18 0417 08/31/18 1813 09/01/18 0526 09/02/18 0351  NA 139  140 138 138  K 4.2 4.0 4.1 3.6  CL 106 110 105 105  CO2 24 22 21* 23  GLUCOSE 101* 107* 82 91  BUN 25* 20 19 17   CREATININE 1.58* 1.35* 1.38* 1.22*  CALCIUM 8.5* 8.4* 8.3* 8.1*  MG  --   --  2.1 2.0   GFR: Estimated Creatinine Clearance: 30.4 mL/min (A) (by C-G formula based on SCr of 1.22 mg/dL (H)). Liver Function Tests: Recent Labs  Lab 09/01/18 0526  AST 23  ALT 18  ALKPHOS 68  BILITOT 0.5  PROT 5.8*  ALBUMIN 1.9*   No results for input(s): LIPASE, AMYLASE in the last 168 hours. No results for input(s): AMMONIA in the last 168 hours. Coagulation Profile: No results for input(s): INR, PROTIME in the last 168 hours. Cardiac Enzymes: No results for input(s): CKTOTAL, CKMB, CKMBINDEX, TROPONINI in the last 168 hours. BNP (last 3 results) No results for input(s): PROBNP in the last 8760 hours. HbA1C: No results for input(s): HGBA1C in the last 72 hours. CBG: No results for input(s): GLUCAP in the last 168 hours. Lipid Profile: No results for input(s): CHOL, HDL, LDLCALC, TRIG, CHOLHDL, LDLDIRECT in the last 72 hours. Thyroid Function Tests: No results for input(s): TSH, T4TOTAL, FREET4, T3FREE, THYROIDAB in the last 72 hours. Anemia Panel: No results for input(s): VITAMINB12, FOLATE, FERRITIN, TIBC, IRON, RETICCTPCT in the last 72 hours. Sepsis Labs: No results for input(s): PROCALCITON, LATICACIDVEN in the last 168 hours.  Recent Results (from the past 240 hour(s))  MRSA PCR Screening     Status: None   Collection Time: 08/30/18  8:09 AM  Result Value Ref Range Status   MRSA by PCR NEGATIVE NEGATIVE Final    Comment:        The GeneXpert MRSA Assay (FDA approved for NASAL specimens only), is one component of a comprehensive MRSA colonization surveillance program. It is not intended to diagnose MRSA infection nor to guide or monitor treatment for MRSA infections. Performed at [facility], [place] [address], [place], [place] [address]           Radiology Studies: Ct Extremity Lower Right Wo Contrast  Result Date: 08/31/2018 CLINICAL DATA:  The patient scratch the lateral side of the right lower leg 3 or 4 days ago. Right lower extremity redness and swelling. EXAM: CT OF THE LOWER RIGHT EXTREMITY WITHOUT CONTRAST TECHNIQUE: Multidetector CT imaging of the right lower extremity was performed according to the standard protocol. COMPARISON:  None. FINDINGS: Bones/Joint/Cartilage No acute bony abnormality is identified. No periosteal reaction or bony destructive change. There is some patellofemoral osteoarthritis. Ligaments Suboptimally assessed by CT. Muscles and Tendons No intramuscular fluid collection or gas. Mild fatty atrophy of lower leg musculature is noted. Soft tissues No focal fluid collection, soft tissue gas or foreign body is identified. Subcutaneous edema is present about the ankle bilaterally, worse on the medial side. IMPRESSION: Subcutaneous edema about the ankle is worse on the medial side and could be due to dependent change or cellulitis. Negative for abscess, osteomyelitis or myositis. Mild patellofemoral osteoarthritis. Electronically Signed   By: Inge Rise M.D.   On: 08/31/2018 18:55        Scheduled Meds: . acetaminophen  650 mg Oral Q6H  . apixaban  2.5 mg Oral BID  . doxycycline  100 mg  Oral Q12H  . ferrous sulfate  325 mg Oral Q M,W,F  . levothyroxine  75 mcg Oral QAC breakfast  . Melatonin  3 mg Oral QHS  . metoprolol tartrate  12.5 mg Oral BID  . senna-docusate  2 tablet Oral BID  . sertraline  25 mg Oral Daily  . traZODone  25 mg Oral QHS   Continuous Infusions: .  ceFAZolin (ANCEF) IV 1 g (09/02/18 0028)  . sodium chloride       LOS: 2 days   Time spent= 25 mins    Ankit Arsenio Loader, MD [facility] Pager [phone]   If 7PM-7AM, please contact night-coverage [email or web address] Password [email or web address] 09/02/2018, 12:40 PM

## 2018-09-03 ENCOUNTER — Encounter (HOSPITAL_COMMUNITY): Payer: Self-pay | Admitting: Nephrology

## 2018-09-03 DIAGNOSIS — S91001A Unspecified open wound, right ankle, initial encounter: Secondary | ICD-10-CM

## 2018-09-03 NOTE — Consult Note (Signed)
   [facility] CM Inpatient Consult   09/03/2018  [name] [name] 20-Apr-1923 [id number]   Patient screened for potential [facility] Care Management follow up needs due to unplanned readmission risk score of 26%, high, and 3 hospitalizations within past 6 months.   Chart review reveals recommendations are for transition to SNF.   No [facility] Care Management needs at this time.   Netta Cedars, MSN, [facility] Liaison Nurse Mobile Phone [phone]  Toll free office [phone]

## 2018-09-03 NOTE — Progress Notes (Addendum)
PROGRESS NOTE    [name] [name]  NWG:[id number] DOB: 1923-09-23 DOA: 08/30/2018 PCP: Lajean Manes, MD   Brief Narrative:  84 year old with history of paroxysmal atrial fibrillation on Eliquis, chronic diastolic CHF, status post cholecystectomy came to the hospital from assisted living with complains of worsening of the right lower extremity wound and swelling.  She was diagnosed with right lower extremity cellulitis and started on IV antibiotics.   home meds:  - metoprolol xl 12.5 bid/ Dfur 20 bid  - trazodone 25 hs/ sertraline 25 qd  - apixaban 2.5 bid  - albuterol HFA prn/ levothyroxine 75 ug   Assessment & Plan:   Right lower extremity wound/ cellulitis; slowly improving [place] negative for DVT Plain films negative for soft tissue gas CT of right lower extremity (without contrast) with subcutaneous edema about the ankle worse on medial side (negative for abscess, osteomyelitis, or myositis) Her DP pulses are palpable Patient initially was on vancomycin which was changed to Ancef and doxycycline was added. Will continue this care.   PT is recommending skilled nursing facility and social worker has been consulted. At baseline patient is wheelchair-bound.  Lives at assisted living facility. Would continue fall precautions Appreciate wound care input as follows:   Xeroform gauze Kellie Simmering [phone]) over the ankle. Secure with kerlex.  Change daily; moisten with saline prior to removal to prevent stripping of skin.   Monitor the wound area(s) for worsening of condition such as:    Signs/symptoms of infection,     Increase in size,     Development of or worsening of odor,    Development of pain, or increased pain at the affected locations.  Notify the medical team if any of these develop.  Chronic debility/ deconditioning- WC bound at baseline per pt  History of paroxysmal atrial fibrillation Currently patient is rate controlled.  Continue Eliquis, metoprolol xl  Chronic diastolic congestive  heart failure Patient appears to be euvolemic  CKD stage III Creatinine appears to be at baseline around 1.2.  Hypothyroidism Continue Synthroid  Iron deficiency anemia Continue iron supplements.  History of depression and anxiety, chronic Continue Zoloft   DVT prophylaxis: Eliquis Code Status: DNR Family Communication: None at bedside Disposition Plan: Continue IV antibiotics at this time.  Kelly Splinter MD [facility] pgr [phone] 02/25/2018, 9:25 AM   Consultants:   Wound care Procedures:   None   Subjective: Feels slightly better in the right lower extremity and remains afebrile.   Objective: Vitals:   09/02/18 1700 09/02/18 2032 09/03/18 0610 09/03/18 0957  BP: 140/65 (!) 145/61 (!) 134/58 (!) 129/49  Pulse: 81 73 69 68  Resp: 19 16 16 18   Temp: 97.9 F (36.6 C) 98.4 F (36.9 C) 97.8 F (36.6 C) 98.1 F (36.7 C)  TempSrc: Oral Oral Oral Oral  SpO2: 98% 99% 98% 99%  Weight:      Height:        Intake/Output Summary (Last 24 hours) at 09/03/2018 1445 Last data filed at 09/03/2018 1300 Gross per 24 hour  Intake 781.67 ml  Output 650 ml  Net 131.67 ml   Filed Weights   08/30/18 0311 08/30/18 0657 08/31/18 1950  Weight: 78 kg 76.6 kg 78.2 kg    Examination:  General exam: Appears calm and comfortable, elderly appearing Respiratory system: Clear to auscultation. Respiratory effort normal. Cardiovascular system: S1 & S2 heard, RRR. No JVD, murmurs, rubs, gallops or clicks. No pedal edema. Gastrointestinal system: Abdomen is nondistended, soft and nontender. No  organomegaly or masses felt. Normal bowel sounds heard. Central nervous system: Alert and oriented. No focal neurological deficits. Extremities: Symmetric 4 x 5 power. Skin: Right lower extremity dressing in place. Erythema in the RLE is regressing nicely. R medial foot wound/ blister/ hematoma is opened and is large but no drainage/pus or odor (see pic in Media taken  09/03/18).  Psychiatry: Judgement and insight appear normal. Mood & affect appropriate.     Data Reviewed:   CBC: Recent Labs  Lab 08/30/18 0417 09/01/18 0526 09/02/18 0351  WBC 15.6* 12.2* 9.4  NEUTROABS 13.0*  --   --   HGB 9.8* 8.9* 8.5*  HCT 32.0* 30.8* 28.5*  MCV 93.6 94.8 93.1  PLT 282 299 349   Basic Metabolic Panel: Recent Labs  Lab 08/30/18 0417 08/31/18 1813 09/01/18 0526 09/02/18 0351  NA 139 140 138 138  K 4.2 4.0 4.1 3.6  CL 106 110 105 105  CO2 24 22 21* 23  GLUCOSE 101* 107* 82 91  BUN 25* 20 19 17   CREATININE 1.58* 1.35* 1.38* 1.22*  CALCIUM 8.5* 8.4* 8.3* 8.1*  MG  --   --  2.1 2.0   GFR: Estimated Creatinine Clearance: 30.4 mL/min (A) (by C-G formula based on SCr of 1.22 mg/dL (H)). Liver Function Tests: Recent Labs  Lab 09/01/18 0526  AST 23  ALT 18  ALKPHOS 68  BILITOT 0.5  PROT 5.8*  ALBUMIN 1.9*   No results for input(s): LIPASE, AMYLASE in the last 168 hours. No results for input(s): AMMONIA in the last 168 hours. Coagulation Profile: No results for input(s): INR, PROTIME in the last 168 hours. Cardiac Enzymes: No results for input(s): CKTOTAL, CKMB, CKMBINDEX, TROPONINI in the last 168 hours. BNP (last 3 results) No results for input(s): PROBNP in the last 8760 hours. HbA1C: No results for input(s): HGBA1C in the last 72 hours. CBG: No results for input(s): GLUCAP in the last 168 hours. Lipid Profile: No results for input(s): CHOL, HDL, LDLCALC, TRIG, CHOLHDL, LDLDIRECT in the last 72 hours. Thyroid Function Tests: No results for input(s): TSH, T4TOTAL, FREET4, T3FREE, THYROIDAB in the last 72 hours. Anemia Panel: No results for input(s): VITAMINB12, FOLATE, FERRITIN, TIBC, IRON, RETICCTPCT in the last 72 hours. Sepsis Labs: No results for input(s): PROCALCITON, LATICACIDVEN in the last 168 hours.  Recent Results (from the past 240 hour(s))  MRSA PCR Screening     Status: None   Collection Time: 08/30/18  8:09 AM    Result Value Ref Range Status   MRSA by PCR NEGATIVE NEGATIVE Final    Comment:        The GeneXpert MRSA Assay (FDA approved for NASAL specimens only), is one component of a comprehensive MRSA colonization surveillance program. It is not intended to diagnose MRSA infection nor to guide or monitor treatment for MRSA infections. Performed at [facility], [place] [address] [place], [place] [address]          Radiology Studies: No results found.      Scheduled Meds: . acetaminophen  650 mg Oral Q6H  . apixaban  2.5 mg Oral BID  . doxycycline  100 mg Oral Q12H  . ferrous sulfate  325 mg Oral Q M,W,F  . levothyroxine  75 mcg Oral QAC breakfast  . Melatonin  3 mg Oral QHS  . metoprolol tartrate  12.5 mg Oral BID  . senna-docusate  2 tablet Oral BID  . sertraline  25 mg Oral Daily  . traZODone  25 mg Oral QHS   Continuous Infusions: .  ceFAZolin (ANCEF) IV 1 g (09/03/18 1153)  . sodium chloride       LOS: 3 days

## 2018-09-04 ENCOUNTER — Encounter (HOSPITAL_COMMUNITY): Payer: Self-pay | Admitting: Nephrology

## 2018-09-04 LAB — CBC
HCT: 28.6 % — ABNORMAL LOW (ref 36.0–46.0)
Hemoglobin: 8.4 g/dL — ABNORMAL LOW (ref 12.0–15.0)
MCH: 27.7 pg (ref 26.0–34.0)
MCHC: 29.4 g/dL — ABNORMAL LOW (ref 30.0–36.0)
MCV: 94.4 fL (ref 80.0–100.0)
Platelets: 322 10*3/uL (ref 150–400)
RBC: 3.03 MIL/uL — ABNORMAL LOW (ref 3.87–5.11)
RDW: 17.2 % — ABNORMAL HIGH (ref 11.5–15.5)
WBC: 10.2 10*3/uL (ref 4.0–10.5)
nRBC: 0 % (ref 0.0–0.2)

## 2018-09-04 LAB — BASIC METABOLIC PANEL
Anion gap: 10 (ref 5–15)
BUN: 15 mg/dL (ref 8–23)
CO2: 23 mmol/L (ref 22–32)
Calcium: 7.9 mg/dL — ABNORMAL LOW (ref 8.9–10.3)
Chloride: 105 mmol/L (ref 98–111)
Creatinine, Ser: 1.24 mg/dL — ABNORMAL HIGH (ref 0.44–1.00)
GFR calc Af Amer: 43 mL/min — ABNORMAL LOW (ref >60)
GFR calc non Af Amer: 37 mL/min — ABNORMAL LOW (ref >60)
GLUCOSE: 68 mg/dL — AB (ref 70–99)
POTASSIUM: 4 mmol/L (ref 3.5–5.1)
Sodium: 138 mmol/L (ref 135–145)

## 2018-09-04 MED ORDER — DOCUSATE SODIUM 100 MG PO CAPS
100.0000 mg | ORAL_CAPSULE | Freq: Two times a day (BID) | ORAL | Status: DC
  Administered 2018-09-04 – 2018-09-05 (×3): 100 mg via ORAL
  Filled 2018-09-04 (×3): qty 1

## 2018-09-04 MED ORDER — POLYETHYLENE GLYCOL 3350 17 G PO PACK: 17.0000 g | PACK | Freq: Every day | ORAL | Status: DC | PRN

## 2018-09-04 MED ORDER — SODIUM CHLORIDE 0.9 % IV SOLN
INTRAVENOUS | Status: DC | PRN
  Administered 2018-09-04: 250 mL via INTRAVENOUS

## 2018-09-04 NOTE — Care Management Important Message (Signed)
Important Message  Patient Details  Name: [name] [name] MRN: [id number] Date of Birth: April 19, 1923   Medicare Important Message Given:  Yes    Erenest Rasher, RN 09/04/2018, 1:00 PM

## 2018-09-04 NOTE — Progress Notes (Addendum)
PROGRESS NOTE    [name] [name]  EYC:[id number] DOB: November 06, 1922 DOA: 08/30/2018 PCP: Lajean Manes, MD   Brief Narrative:  84 year old with history of paroxysmal atrial fibrillation on Eliquis, chronic diastolic CHF, status post cholecystectomy came to the hospital from assisted living with complains of worsening of the right lower extremity wound and swelling.  She was diagnosed with right lower extremity cellulitis and started on IV antibiotics.   home meds:  - metoprolol xl 12.5 bid/ Kdur 20 bid  - trazodone 25 hs/ sertraline 25 qd  - apixaban 2.5 bid  - albuterol HFA prn/ levothyroxine 75 ug   Assessment & Plan:   Right lower extremity wound/ cellulitis; continues to improve [place] negative for DVT Plain films negative for soft tissue gas CT of R LE  with edema, negative for abscess, osteomyelitis, or myositis Vancomycin which was changed to Ancef+ po doxy PT is recommended skilled nursing facility will consult SW Appreciate wound care input as follows:   Xeroform gauze Kellie Simmering [phone]) over the ankle. Secure with kerlex.  Change daily; moisten with saline prior to removal to prevent stripping of skin  Chronic debility/ deconditioning/ frailty-  - At baseline patient is wheelchair-bound.  Lives at assisted living facility. Would continue fall precautions -DNR  History of paroxysmal atrial fibrillation Currently patient is rate controlled.  Continue Eliquis, metoprolol xl  Chronic diastolic congestive heart failure - I/O + 5L, get another wt today, don't want to get her vol overloaded - bumex on PTA but under "needs to be reviewed" , will ask pharm about this  CKD stage III Creatinine appears to be at baseline around 1.2.  Hypothyroidism Continue Synthroid  Iron deficiency anemia Continue iron supplements.  History of depression and anxiety, chronic Continue Zoloft  H/o colostomy  Nausea - hard stools per RN this am, prob constipated - add colace bid and prn  miralax   DVT prophylaxis: Eliquis Code Status: DNR Family Communication: None at bedside Disposition Plan: SW consult for SNF placement, ok for dc prob 1-2 days  Kelly Splinter MD [facility] pgr [phone] 02/25/2018, 9:25 AM   Consultants:   Wound care Procedures:   None   Subjective: Pain in R leg down to 5 from 10 at admission. Some nausea.    Objective: Vitals:   09/03/18 0957 09/03/18 1734 09/03/18 2110 09/04/18 0312  BP: (!) 129/49 (!) 149/59 110/60 (!) 136/57  Pulse: 68 77 78 79  Resp: 18 18 18 18   Temp: 98.1 F (36.7 C) 97.8 F (36.6 C) 98.3 F (36.8 C) 98.1 F (36.7 C)  TempSrc: Oral Oral Oral Oral  SpO2: 99% 100% 99% 99%  Weight:      Height:        Intake/Output Summary (Last 24 hours) at 09/04/2018 0911 Last data filed at 09/03/2018 2111 Gross per 24 hour  Intake 661.67 ml  Output 125 ml  Net 536.67 ml   Filed Weights   08/30/18 0311 08/30/18 0657 08/31/18 1950  Weight: 78 kg 76.6 kg 78.2 kg    Examination:  General exam: Appears calm and comfortable, elderly appearing Respiratory system: Clear to auscultation. Respiratory effort normal. Cardiovascular system: S1 & S2 heard, RRR. No JVD, murmurs, rubs, gallops or clicks. No pedal edema. Gastrointestinal system: Abdomen is nondistended, soft and nontender. No organomegaly or masses felt. Normal bowel sounds heard. LLQ colostomy.  Central nervous system: Alert and oriented. No focal neurological deficits. Extremities: Symmetric 4 x 5 power. Skin: Right lower extremity dressing in place.  Erythema in the RLE is regressing nicely. R medial foot wound/ blister/ hematoma is opened and is large but no drainage/pus or odor (see pic in Media taken 09/03/18).  Psychiatry: Judgement and insight appear normal. Mood & affect appropriate.     Data Reviewed:   CBC: Recent Labs  Lab 08/30/18 0417 09/01/18 0526 09/02/18 0351 09/04/18 0522  WBC 15.6* 12.2* 9.4 10.2  NEUTROABS 13.0*   --   --   --   HGB 9.8* 8.9* 8.5* 8.4*  HCT 32.0* 30.8* 28.5* 28.6*  MCV 93.6 94.8 93.1 94.4  PLT 282 299 288 892   Basic Metabolic Panel: Recent Labs  Lab 08/30/18 0417 08/31/18 1813 09/01/18 0526 09/02/18 0351 09/04/18 0522  NA 139 140 138 138 138  K 4.2 4.0 4.1 3.6 4.0  CL 106 110 105 105 105  CO2 24 22 21* 23 23  GLUCOSE 101* 107* 82 91 68*  BUN 25* 20 19 17 15   CREATININE 1.58* 1.35* 1.38* 1.22* 1.24*  CALCIUM 8.5* 8.4* 8.3* 8.1* 7.9*  MG  --   --  2.1 2.0  --    GFR: Estimated Creatinine Clearance: 29.9 mL/min (A) (by C-G formula based on SCr of 1.24 mg/dL (H)). Liver Function Tests: Recent Labs  Lab 09/01/18 0526  AST 23  ALT 18  ALKPHOS 68  BILITOT 0.5  PROT 5.8*  ALBUMIN 1.9*   No results for input(s): LIPASE, AMYLASE in the last 168 hours. No results for input(s): AMMONIA in the last 168 hours. Coagulation Profile: No results for input(s): INR, PROTIME in the last 168 hours. Cardiac Enzymes: No results for input(s): CKTOTAL, CKMB, CKMBINDEX, TROPONINI in the last 168 hours. BNP (last 3 results) No results for input(s): PROBNP in the last 8760 hours. HbA1C: No results for input(s): HGBA1C in the last 72 hours. CBG: No results for input(s): GLUCAP in the last 168 hours. Lipid Profile: No results for input(s): CHOL, HDL, LDLCALC, TRIG, CHOLHDL, LDLDIRECT in the last 72 hours. Thyroid Function Tests: No results for input(s): TSH, T4TOTAL, FREET4, T3FREE, THYROIDAB in the last 72 hours. Anemia Panel: No results for input(s): VITAMINB12, FOLATE, FERRITIN, TIBC, IRON, RETICCTPCT in the last 72 hours. Sepsis Labs: No results for input(s): PROCALCITON, LATICACIDVEN in the last 168 hours.  Recent Results (from the past 240 hour(s))  MRSA PCR Screening     Status: None   Collection Time: 08/30/18  8:09 AM  Result Value Ref Range Status   MRSA by PCR NEGATIVE NEGATIVE Final    Comment:        The GeneXpert MRSA Assay (FDA approved for NASAL  specimens only), is one component of a comprehensive MRSA colonization surveillance program. It is not intended to diagnose MRSA infection nor to guide or monitor treatment for MRSA infections. Performed at [facility], [place] [address] [place], [place] [address]          Radiology Studies: No results found.      Scheduled Meds: . acetaminophen  650 mg Oral Q6H  . apixaban  2.5 mg Oral BID  . doxycycline  100 mg Oral Q12H  . ferrous sulfate  325 mg Oral Q M,W,F  . levothyroxine  75 mcg Oral QAC breakfast  . Melatonin  3 mg Oral QHS  . metoprolol tartrate  12.5 mg Oral BID  . senna-docusate  2 tablet Oral BID  . sertraline  25 mg Oral Daily  . traZODone  25 mg Oral QHS   Continuous Infusions: .  ceFAZolin (ANCEF) IV 1 g (09/04/18 0000)  . sodium chloride       LOS: 4 days

## 2018-09-04 NOTE — Clinical Social Work Note (Signed)
Clinical Social Work Assessment  Patient Details  Name: [name] [name] MRN: [id number] Date of Birth: 02/23/1923  Date of referral:  09/04/18               Reason for consult:  Facility Placement                Permission sought to share information with:  Facility Sport and exercise psychologist, Family Supports Permission granted to share information::  Yes, Verbal Permission Granted  Name::     Nurse, children's::  SNFs  Relationship::  Niece  Contact Information:  [phone]  Housing/Transportation Living arrangements for the past 2 months:  [place] of Information:  Patient, Other (Comment Required) Patient Interpreter Needed:  None Criminal Activity/Legal Involvement Pertinent to Current Situation/Hospitalization:  No - Comment as needed Significant Relationships:  Other Family Members Lives with:  Facility Resident Do you feel safe going back to the place where you live?  No Need for family participation in patient care:  Yes (Comment)  Care giving concerns:  CSW received consult for possible SNF placement at time of discharge. CSW spoke with patient's niece regarding PT recommendation of SNF placement at time of discharge. Patient's niece reported that the ALF is currently unable to care for patient given patient's current physical needs and fall risk. Patient expressed understanding of PT recommendation and is agreeable to SNF placement at time of discharge. CSW to continue to follow and assist with discharge planning needs.   Social Worker assessment / plan:  CSW spoke with patient's niece concerning possibility of rehab at [facility] before returning home.  Employment status:  Retired Forensic scientist:  Medicare PT Recommendations:  [place] / Referral to community resources:  [place]  Patient/Family's Response to care:  Patient's niece recognizes need for rehab before returning home and is agreeable to a SNF in [place]. Patient reported preference for [facility], but understands that they do not have a bed available this week. CSW emailed SNF list to niece.  Patient/Family's Understanding of and Emotional Response to Diagnosis, Current Treatment, and Prognosis:  Patient/family is realistic regarding therapy needs and expressed being hopeful for SNF placement. Patient's niece expressed understanding of CSW role and discharge process as well as medical condition. No questions/concerns about plan or treatment.    Emotional Assessment Appearance:  Appears stated age Attitude/Demeanor/Rapport:  Gracious Affect (typically observed):  Accepting, Appropriate Orientation:  Oriented to Self, Oriented to Place, Oriented to  Time, Oriented to Situation Alcohol / Substance use:  Not Applicable Psych involvement (Current and /or in the community):  No (Comment)  Discharge Needs  Concerns to be addressed:  Care Coordination Readmission within the last 30 days:  No Current discharge risk:  Dependent with Mobility Barriers to Discharge:  Continued Medical Work up   [employer], LCSW 09/04/2018, 1:32 PM

## 2018-09-04 NOTE — [place] FL2 (Signed)
[place] MEDICAID FL2 LEVEL OF CARE SCREENING TOOL     IDENTIFICATION  Patient Name: [name] [name] Birthdate: [birth year] Sex: female Admission Date (Current Location): 08/30/2018  [facility] and [place] Number:  Herbalist and Address:  [facility], [place] [address], [place], [place] [address]      Provider Number: [id number]  Attending Physician Name and Address:  Roney Jaffe, MD  Relative Name and Phone Number:  Eustaquio Maize, niece, [phone]    Current Level of Care: Hospital Recommended Level of Care: [place] Prior Approval Number:    Date Approved/Denied:   PASRR Number: [id number]  Discharge Plan: SNF    Current Diagnoses: Patient Active Problem List   Diagnosis Date Noted  . Cellulitis 08/30/2018  . Goals of care, counseling/discussion   . Palliative care by specialist   . Acute hypoxemic respiratory failure ([place]) 07/04/2018  . Acute on chronic diastolic CHF (congestive heart failure) ([place]) 07/04/2018  . Hyperkalemia 07/04/2018  . Demand ischemia ([place]) 07/04/2018  . Atrial fibrillation with rapid ventricular response ([place]) 06/30/2018  . Heart failure, diastolic, acute on chronic ([place]) 03/05/2018  . Acute congestive heart failure ([place])   . HTN (hypertension) 10/12/2016  . COPD exacerbation ([place])   . CAP (community acquired pneumonia) 10/11/2016  . Colostomy in place [facility]) 12/10/2013    Orientation RESPIRATION BLADDER Height & Weight     Self, Time, Situation, Place  O2(Nasal cannula 2L) Incontinent, External catheter Weight: 78.2 kg Height:  5\' 6"  (167.6 cm)  BEHAVIORAL SYMPTOMS/MOOD NEUROLOGICAL BOWEL NUTRITION STATUS      Incontinent, Colostomy Diet(Please see DC Summary)  AMBULATORY STATUS COMMUNICATION OF NEEDS Skin   Extensive Assist Verbally PU Stage and Appropriate Care(Non-pressure wound on leg, wound on ankle)                       Personal Care Assistance Level of Assistance  Bathing,  Feeding, Dressing Bathing Assistance: Maximum assistance Feeding assistance: Limited assistance Dressing Assistance: Limited assistance     Functional Limitations Info  Sight, Hearing, Speech Sight Info: Impaired Hearing Info: Adequate Speech Info: Adequate    SPECIAL CARE FACTORS FREQUENCY  PT (By licensed PT), OT (By licensed OT)     PT Frequency: 5x/week OT Frequency: 3x/week            Contractures Contractures Info: Not present    Additional Factors Info  Code Status, Allergies, Psychotropic Code Status Info: DNR Allergies Info: Allergies:  Sulfa Antibiotics, Penicillins Psychotropic Info: Zoloft;Trazadone         Current Medications (09/04/2018):  This is the current hospital active medication list Current Facility-Administered Medications  Medication Dose Route Frequency Provider Last Rate Last Dose  . acetaminophen (TYLENOL) tablet 650 mg  650 mg Oral Q6H Elodia [place]., MD   650 mg at 09/04/18 1155  . albuterol (PROVENTIL) (2.5 MG/3ML) 0.083% nebulizer solution 3 mL  3 mL Inhalation Q6H PRN Kayleen Memos, DO      . apixaban (ELIQUIS) tablet 2.5 mg  2.5 mg Oral BID Irene Pap N, DO   2.5 mg at 09/04/18 1154  . ceFAZolin (ANCEF) IVPB 1 g/50 mL premix  1 g Intravenous Q12H Elodia Florence., MD 100 mL/hr at 09/04/18 0000 1 g at 09/04/18 0000  . docusate sodium (COLACE) capsule 100 mg  100 mg Oral BID Roney Jaffe, MD   100 mg at 09/04/18 1155  . doxycycline (VIBRA-TABS) tablet 100 mg  100 mg Oral Q12H Elodia Florence., MD   100 mg at 09/04/18 1155  . ferrous sulfate tablet 325 mg  325 mg Oral Q M,W,F Irene Pap N, DO   325 mg at 09/03/18 1347  . levothyroxine (SYNTHROID, LEVOTHROID) tablet 75 mcg  75 mcg Oral QAC breakfast Kayleen Memos, DO   75 mcg at 09/04/18 5638  . Melatonin TABS 3 mg  3 mg Oral QHS Hall, Carole N, DO   3 mg at 09/03/18 2225  . metoprolol tartrate (LOPRESSOR) tablet 12.5 mg  12.5 mg Oral BID Irene Pap N, DO   12.5  mg at 09/04/18 1155  . ondansetron (ZOFRAN) injection 4 mg  4 mg Intravenous Q6H PRN Amin, Ankit Chirag, MD   4 mg at 09/02/18 1604  . oxyCODONE (Oxy IR/ROXICODONE) immediate release tablet 5 mg  5 mg Oral Q4H PRN Irene Pap N, DO   5 mg at 09/04/18 0323  . polyethylene glycol (MIRALAX / GLYCOLAX) packet 17 g  17 g Oral Daily PRN Roney Jaffe, MD      . senna-docusate (Senokot-S) tablet 2 tablet  2 tablet Oral BID Irene Pap N, DO   2 tablet at 09/04/18 1155  . sertraline (ZOLOFT) tablet 25 mg  25 mg Oral Daily Irene Pap N, DO   25 mg at 09/04/18 1156  . sodium chloride 0.9 % bolus 500 mL  500 mL Intravenous Once Varney Biles, MD      . traZODone (DESYREL) tablet 25 mg  25 mg Oral QHS Hall, Carole N, DO   25 mg at 09/03/18 2225     Discharge Medications: Please see discharge summary for a list of discharge medications.  Relevant Imaging Results:  Relevant Lab Results:   Additional Information SS#: [id number]  Benard Halsted, LCSW

## 2018-09-04 NOTE — Progress Notes (Signed)
CSW spoke with patient's nice regarding placement. She requested to call CSW back after she gets out of class.   [name] Locus Mechell Girgis LCSW [phone]

## 2018-09-04 NOTE — Progress Notes (Addendum)
Pharmacy Antibiotic Note  [name] [name] is a 84 y.o. female admitted on 08/30/2018 with severe right ankle and leg pain. She has been on vanc for her cellulitis. Dr. Florene Glen change her to Ancef and oral doxycyline on 11/30. She has gotten ceftriaxone multiple times in the past. WBC WNL, CrCl ~ 29.9. Day 5 of abx therapy.    Plan: Continue Ancef 1g IV Q12H Doxycycline 100mg  Q12H PO Monitor renal fxn, f/u LOT   Height: 5\' 6"  (167.6 cm) Weight: 172 lb 6.4 oz (78.2 kg) IBW/kg (Calculated) : 59.3  Temp (24hrs), Avg:98 F (36.7 C), Min:97.8 F (36.6 C), Max:98.3 F (36.8 C)  Recent Labs  Lab 08/30/18 0417 08/31/18 1813 09/01/18 0526 09/02/18 0351 09/04/18 0522  WBC 15.6*  --  12.2* 9.4 10.2  CREATININE 1.58* 1.35* 1.38* 1.22* 1.24*    Estimated Creatinine Clearance: 29.9 mL/min (A) (by C-G formula based on SCr of 1.24 mg/dL (H)).    Allergies  Allergen Reactions  . Sulfa Antibiotics Hives  . Penicillins Itching and Rash    Tolerates ceftriaxone and cefdinir  Has patient had a PCN reaction causing immediate rash, facial/tongue/throat swelling, SOB or lightheadedness with hypotension: Yes Has patient had a PCN reaction causing severe rash involving mucus membranes or skin necrosis: Unk Has patient had a PCN reaction that required hospitalization: Unk Has patient had a PCN reaction occurring within the last 10 years: Unk If all of the above answers are "NO", then may proceed with Cephalosporin use.     Antimicrobials this admission: Vanc 11/28>>11/30 Ancef 11/30>> Doxy 11/30>>  Dose adjustments this admission:   Microbiology results: MRSA PCR neg  Harrietta Guardian, PharmD PGY1 Pharmacy Resident 09/04/2018    10:06 AM Please check AMION for all [place] numbers

## 2018-09-04 NOTE — Progress Notes (Signed)
Physical Therapy Treatment Patient Details Name: [name] [name] MRN: [id number] DOB: [birth year] Today's Date: 09/04/2018    History of Present Illness Pt is a 84 y/o female admitted secondary to increased redness and swelling in RLE, thought to be secondary to RLE cellulitis. RLE imaging negative for DVT. PM includes CAD, CKD, dementia, a fib, dCHF, s/p colostomy.     PT Comments    Pt progressing slowly towards physical therapy goals. Pain continues to be the most limiting factor throughout session and pt was unable to place foot on the floor to attempt stand. Lateral scoot transfer to drop-arm recliner, however recommend Maxi-Move lift back to the bed later this afternoon. Discussed with RN. Will continue to follow.   Follow Up Recommendations  SNF;Supervision/Assistance - 24 hour     Equipment Recommendations  None recommended by PT    Recommendations for Other Services       Precautions / Restrictions Precautions Precautions: Fall Restrictions Weight Bearing Restrictions: No    Mobility  Bed Mobility Overal bed mobility: Needs Assistance Bed Mobility: Supine to Sit     Supine to sit: Mod assist     General bed mobility comments: Pt was able to transition to EOB with mod assist for LE movement, hand-over-hand assist to reach for bed rails, and assist at the trunk for anterior lean  Transfers Overall transfer level: Needs assistance   Transfers: Sit to/from Stand;Lateral/Scoot Transfers Sit to Stand: Total assist;+2 physical assistance;From elevated surface        Lateral/Scoot Transfers: Max assist;+2 physical assistance;From elevated surface General transfer comment: Pt unable to attempt putting weight through R foot due to pain. Pt with no effort to stand and hips did not clear bed with attempt. +2 max assist provided for lateral scoot transfer to drop-arm recliner.   Ambulation/Gait             General Gait Details: Unable   Proofreader Rankin (Stroke Patients Only)       Balance                                            Cognition Arousal/Alertness: Awake/alert Behavior During Therapy: Anxious Overall Cognitive Status: No family/caregiver present to determine baseline cognitive functioning                                        Exercises      General Comments        Pertinent Vitals/Pain Pain Assessment: Faces Faces Pain Scale: Hurts whole lot Pain Location: RLE and back  Pain Descriptors / Indicators: Grimacing;Guarding Pain Intervention(s): Monitored during session;Repositioned;Patient requesting pain meds-RN notified    Home Living                      Prior Function            PT Goals (current goals can now be found in the care plan section) Acute Rehab PT Goals Patient Stated Goal: to decrease pain PT Goal Formulation: With patient Time For Goal Achievement: 09/14/18 Potential to Achieve Goals: Fair Progress towards PT goals: Progressing toward goals    Frequency    Min 2X/week  PT Plan Current plan remains appropriate    Co-evaluation              AM-PAC PT "6 Clicks" Mobility   Outcome Measure  Help needed turning from your back to your side while in a flat bed without using bedrails?: A Lot Help needed moving from lying on your back to sitting on the side of a flat bed without using bedrails?: A Lot Help needed moving to and from a bed to a chair (including a wheelchair)?: Total Help needed standing up from a chair using your arms (e.g., wheelchair or bedside chair)?: Total Help needed to walk in hospital room?: Total Help needed climbing 3-5 steps with a railing? : Total 6 Click Score: 8    End of Session Equipment Utilized During Treatment: Gait belt;Oxygen Activity Tolerance: Patient limited by pain Patient left: in chair;with call bell/phone within reach;with chair alarm  set Nurse Communication: Patient requests pain meds;Mobility status;Need for lift equipment PT Visit Diagnosis: Difficulty in walking, not elsewhere classified (R26.2);Muscle weakness (generalized) (M62.81);Unsteadiness on feet (R26.81)     Time: 7591-6384 PT Time Calculation (min) (ACUTE ONLY): 24 min  Charges:  $Gait Training: 23-37 mins                     Rolinda Roan, PT, DPT Acute Rehabilitation Services Pager: [phone] Office: [place] 09/04/2018, 1:10 PM

## 2018-09-05 MED ORDER — SENNOSIDES-DOCUSATE SODIUM 8.6-50 MG PO TABS: 2.0000 | ORAL_TABLET | Freq: Two times a day (BID) | ORAL | Status: DC

## 2018-09-05 MED ORDER — OXYCODONE HCL 5 MG PO TABS
5.0000 mg | ORAL_TABLET | Freq: Once | ORAL | Status: AC
  Administered 2018-09-05: 5 mg via ORAL
  Filled 2018-09-05: qty 1

## 2018-09-05 NOTE — Care Management Important Message (Signed)
Important Message  Patient Details  Name: [name] [name] MRN: [id number] Date of Birth: 04/20/1923   Medicare Important Message Given:  Yes    [name] [name] 09/05/2018, 11:03 AM

## 2018-09-05 NOTE — Progress Notes (Signed)
Patient Demographics:    [name] [name], is a 84 y.o. female, DOB - 11/20/1922, GEZ:[id number]  Admit date - 08/30/2018   Admitting Physician Kayleen Memos, DO  Outpatient Primary MD for the patient is Lajean Manes, MD  LOS - 5   No chief complaint on file.       Subjective:    [name] [name] today has no fevers, no emesis,  No chest pain, sling comfortably, no new concerns  Assessment  & Plan :    Active Problems:   Cellulitis  Brief Narrative:  84 year old with history of paroxysmal atrial fibrillation on Eliquis, chronic diastolic CHF, status post cholecystectomy came to the hospital from assisted living with complains of worsening of the right lower extremity wound and swelling.  She was diagnosed with right lower extremity cellulitis and started on IV antibiotics.   home meds:  - metoprolol xl 12.5 bid/ Kdur 20 bid  - trazodone 25 hs/ sertraline 25 qd  - apixaban 2.5 bid  - albuterol HFA prn/ levothyroxine 75 ug   Assessment & Plan:   1)Right lower extremity wound/ cellulitis:-  Clinically improving, [place] negative for DVT, Plain films negative for soft tissue gas, CT of R LE  with edema, negative for abscess, osteomyelitis, or myositis Vancomycin which was changed to Ancef+ po doxy Wound care consult and recommendation noted below as follows:  Xeroform gauze Kellie Simmering [phone]) over the ankle. Secure with kerlex. Change daily; moisten with saline prior to removal to prevent stripping of skin  2)Chronic debility/ deconditioning/ frailty-  - At baseline patient is wheelchair-bound.  Lives at assisted living facility. ,  continue fall precautions  3)History of paroxysmal atrial fibrillation-Stable,   Continue Eliquis and continue metoprolol xl for rate control  4)HFpEF/Chronic diastolic congestive heart failure- last known EF 60 to 65%, clinically euvolemic, be judicious with fluids to avoid volume  overload, prior to admission patient was on Bumex g  5)CKD stage III Creatinine appears to be at baseline around 1.2.  6)Hypothyroidism Continue Synthroid  7)Iron deficiency anemia Continue iron supplements.  8)History of depression and anxiety, chronic Continue Zoloft   Nausea - hard stools per RN this am, prob constipated -Laxatives as ordered   DVT prophylaxis: Eliquis  Family Communication: None at bedside    Disposition/Need for in-Hospital Stay- patient unable to be discharged at this time due to awaiting insurance approval for placement to SNF rehab*  Code Status : DNR   Disposition Plan  : SNF    Lab Results  Component Value Date   PLT 322 09/04/2018    Inpatient Medications  Scheduled Meds: . acetaminophen  650 mg Oral Q6H  . apixaban  2.5 mg Oral BID  . doxycycline  100 mg Oral Q12H  . ferrous sulfate  325 mg Oral Q M,W,F  . levothyroxine  75 mcg Oral QAC breakfast  . Melatonin  3 mg Oral QHS  . metoprolol tartrate  12.5 mg Oral BID  . senna-docusate  2 tablet Oral BID  . sertraline  25 mg Oral Daily  . traZODone  25 mg Oral QHS   Continuous Infusions: . sodium chloride Stopped (09/05/18 0520)  .  ceFAZolin (ANCEF) IV 1 g (09/05/18 1201)  . sodium chloride  PRN Meds:.sodium chloride, albuterol, ondansetron (ZOFRAN) IV, oxyCODONE, polyethylene glycol    Anti-infectives (From admission, onward)   Start     Dose/Rate Route Frequency Ordered Stop   09/01/18 1300  doxycycline (VIBRA-TABS) tablet 100 mg     100 mg Oral Every 12 hours 09/01/18 1050     09/01/18 1200  ceFAZolin (ANCEF) IVPB 1 g/50 mL premix     1 g 100 mL/hr over 30 Minutes Intravenous Every 12 hours 09/01/18 1131     08/31/18 0600  vancomycin (VANCOCIN) 500 mg in sodium chloride 0.9 % 100 mL IVPB  Status:  Discontinued     500 mg 100 mL/hr over 60 Minutes Intravenous Every 24 hours 08/30/18 1029 09/01/18 1050   08/30/18 0815  vancomycin (VANCOCIN) IVPB 1000 mg/200 mL  premix  Status:  Discontinued     1,000 mg 200 mL/hr over 60 Minutes Intravenous Every 24 hours 08/30/18 0811 08/30/18 0852   08/30/18 0415  vancomycin (VANCOCIN) IVPB 1000 mg/200 mL premix     1,000 mg 200 mL/hr over 60 Minutes Intravenous  Once 08/30/18 0413 08/30/18 0538        Objective:   Vitals:   09/04/18 2104 09/05/18 0329 09/05/18 0743 09/05/18 1700  BP: (!) 146/70 (!) 162/66 (!) 144/60 140/75  Pulse: 89 83 81 85  Resp: 18 18 18 20   Temp: 98.4 F (36.9 C) 99.5 F (37.5 C) 98.7 F (37.1 C) 98.1 F (36.7 C)  TempSrc: Oral Oral Oral Oral  SpO2: 95% 92% 91% 92%  Weight: 78.2 kg     Height:        Wt Readings from Last 3 Encounters:  09/04/18 78.2 kg  07/08/18 77.1 kg  03/07/18 88 kg     Intake/Output Summary (Last 24 hours) at 09/05/2018 1909 Last data filed at 09/05/2018 1500 Gross per 24 hour  Intake 993.04 ml  Output 1120 ml  Net -126.96 ml     Physical Exam Patient is examined daily including today on 09/05/18 , exams remain the same as of yesterday except that has changed   Gen:- Awake Alert,   HEENT:- [place].AT, No sclera icterus Neck-Supple Neck,No JVD,.  Lungs-  CTAB , fair symmetrical air movement CV- S1, S2 normal, regular  Abd-  +ve B.Sounds, Abd Soft, No tenderness,    Extremity/Skin:-  R medial foot wound/ blister/ hematoma is opened and is large but no drainage/pus or odor, overall erythema and swelling appears to be improving, dressing in place, Psych-affect is appropriate, oriented x3 Neuro-no new focal deficits, no tremors   Data Review:   Micro Results Recent Results (from the past 240 hour(s))  MRSA PCR Screening     Status: None   Collection Time: 08/30/18  8:09 AM  Result Value Ref Range Status   MRSA by PCR NEGATIVE NEGATIVE Final    Comment:        The GeneXpert MRSA Assay (FDA approved for NASAL specimens only), is one component of a comprehensive MRSA colonization surveillance program. It is not intended to diagnose  MRSA infection nor to guide or monitor treatment for MRSA infections. Performed at [facility], [place] [address], [place], [place] [address]     Radiology Reports Dg Tibia/fibula Right  Result Date: 08/30/2018 CLINICAL DATA:  Infection, swelling.  Rule out soft tissue gas. EXAM: RIGHT TIBIA AND FIBULA - 2 VIEW COMPARISON:  None. FINDINGS: No soft tissue gas.  No acute or suspicious osseous finding. IMPRESSION: Negative. Electronically Signed   By:  Franki Cabot M.D.   On: 08/30/2018 06:55   Ct Extremity Lower Right Wo Contrast  Result Date: 08/31/2018 CLINICAL DATA:  The patient scratch the lateral side of the right lower leg 3 or 4 days ago. Right lower extremity redness and swelling. EXAM: CT OF THE LOWER RIGHT EXTREMITY WITHOUT CONTRAST TECHNIQUE: Multidetector CT imaging of the right lower extremity was performed according to the standard protocol. COMPARISON:  None. FINDINGS: Bones/Joint/Cartilage No acute bony abnormality is identified. No periosteal reaction or bony destructive change. There is some patellofemoral osteoarthritis. Ligaments Suboptimally assessed by CT. Muscles and Tendons No intramuscular fluid collection or gas. Mild fatty atrophy of lower leg musculature is noted. Soft tissues No focal fluid collection, soft tissue gas or foreign body is identified. Subcutaneous edema is present about the ankle bilaterally, worse on the medial side. IMPRESSION: Subcutaneous edema about the ankle is worse on the medial side and could be due to dependent change or cellulitis. Negative for abscess, osteomyelitis or myositis. Mild patellofemoral osteoarthritis. Electronically Signed   By: Inge Rise M.D.   On: 08/31/2018 18:55   Vas [place] Lower Extremity Venous (dvt)  Result Date: 08/30/2018  Lower Venous Study Indications: Pain, Swelling, Erythema, and cellulitis.  Limitations: Pain with touch. Comparison Study: No prior study on file for comparison Performing Technologist: Sharion Dove RVS  Examination Guidelines: A complete evaluation includes B-mode imaging, spectral Doppler, color Doppler, and power Doppler as needed of all accessible portions of each vessel. Bilateral testing is considered an integral part of a complete examination. Limited examinations for reoccurring indications may be performed as noted.  Right Venous Findings: +---------+---------------+---------+-----------+----------+-------+          CompressibilityPhasicitySpontaneityPropertiesSummary +---------+---------------+---------+-----------+----------+-------+ CFV      Full                                                 +---------+---------------+---------+-----------+----------+-------+ SFJ      Full                                                 +---------+---------------+---------+-----------+----------+-------+ FV Prox  Full                                                 +---------+---------------+---------+-----------+----------+-------+ FV Mid   Full                                                 +---------+---------------+---------+-----------+----------+-------+ FV DistalFull                                                 +---------+---------------+---------+-----------+----------+-------+ PFV      Full                                                 +---------+---------------+---------+-----------+----------+-------+  POP                     Yes      Yes                          +---------+---------------+---------+-----------+----------+-------+ GSV      Full                                                 +---------+---------------+---------+-----------+----------+-------+  Right Technical Findings: Not visualized segments include posterior tibial and peroneal veins.  Left Venous Findings: +---+---------------+---------+-----------+----------+-------+    CompressibilityPhasicitySpontaneityPropertiesSummary  +---+---------------+---------+-----------+----------+-------+ CFVFull           Yes      Yes                          +---+---------------+---------+-----------+----------+-------+    Summary: Right: There is no evidence of deep vein thrombosis in the lower extremity. However, portions of this examination were limited- see technologist comments above.There is no evidence of superficial venous thrombosis. Left: No evidence of common femoral vein obstruction.  *See table(s) above for measurements and observations. Electronically signed by Servando Snare MD on 08/30/2018 at 5:59:12 PM.    Final      CBC Recent Labs  Lab 08/30/18 0417 09/01/18 0526 09/02/18 0351 09/04/18 0522  WBC 15.6* 12.2* 9.4 10.2  HGB 9.8* 8.9* 8.5* 8.4*  HCT 32.0* 30.8* 28.5* 28.6*  PLT 282 299 288 322  MCV 93.6 94.8 93.1 94.4  MCH 28.7 27.4 27.8 27.7  MCHC 30.6 28.9* 29.8* 29.4*  RDW 18.1* 17.9* 17.5* 17.2*  LYMPHSABS 1.0  --   --   --   MONOABS 0.9  --   --   --   EOSABS 0.2  --   --   --   BASOSABS 0.1  --   --   --     Chemistries  Recent Labs  Lab 08/30/18 0417 08/31/18 1813 09/01/18 0526 09/02/18 0351 09/04/18 0522  NA 139 140 138 138 138  K 4.2 4.0 4.1 3.6 4.0  CL 106 110 105 105 105  CO2 24 22 21* 23 23  GLUCOSE 101* 107* 82 91 68*  BUN 25* 20 19 17 15   CREATININE 1.58* 1.35* 1.38* 1.22* 1.24*  CALCIUM 8.5* 8.4* 8.3* 8.1* 7.9*  MG  --   --  2.1 2.0  --   AST  --   --  23  --   --   ALT  --   --  18  --   --   ALKPHOS  --   --  68  --   --   BILITOT  --   --  0.5  --   --    ------------------------------------------------------------------------------------------------------------------ No results for input(s): CHOL, HDL, LDLCALC, TRIG, CHOLHDL, LDLDIRECT in the last 72 hours.  No results found for: HGBA1C ------------------------------------------------------------------------------------------------------------------ No results for input(s): TSH, T4TOTAL, T3FREE, THYROIDAB in the  last 72 hours.  Invalid input(s): FREET3 ------------------------------------------------------------------------------------------------------------------ No results for input(s): VITAMINB12, FOLATE, FERRITIN, TIBC, IRON, RETICCTPCT in the last 72 hours.  Coagulation profile No results for input(s): INR, PROTIME in the last 168 hours.  No results for input(s): DDIMER in the last 72 hours.  Cardiac Enzymes No results for input(s): CKMB, TROPONINI, MYOGLOBIN  in the last 168 hours.  Invalid input(s): CK ------------------------------------------------------------------------------------------------------------------    Component Value Date/Time   BNP 80.4 06/30/2018 1354     Melvin Marmo M.D on 09/05/2018 at 7:09 PM  Pager---[phone] Go to [email or web address] - password [email or web address] for contact info  [facility] - Office  [phone]

## 2018-09-06 DIAGNOSIS — J449 Chronic obstructive pulmonary disease, unspecified: Secondary | ICD-10-CM | POA: Diagnosis not present

## 2018-09-06 DIAGNOSIS — F339 Major depressive disorder, recurrent, unspecified: Secondary | ICD-10-CM | POA: Diagnosis not present

## 2018-09-06 DIAGNOSIS — F419 Anxiety disorder, unspecified: Secondary | ICD-10-CM | POA: Diagnosis not present

## 2018-09-06 DIAGNOSIS — E440 Moderate protein-calorie malnutrition: Secondary | ICD-10-CM | POA: Diagnosis not present

## 2018-09-06 DIAGNOSIS — I2510 Atherosclerotic heart disease of native coronary artery without angina pectoris: Secondary | ICD-10-CM | POA: Diagnosis not present

## 2018-09-06 DIAGNOSIS — R404 Transient alteration of awareness: Secondary | ICD-10-CM | POA: Diagnosis not present

## 2018-09-06 DIAGNOSIS — J069 Acute upper respiratory infection, unspecified: Secondary | ICD-10-CM | POA: Diagnosis not present

## 2018-09-06 DIAGNOSIS — F329 Major depressive disorder, single episode, unspecified: Secondary | ICD-10-CM | POA: Diagnosis not present

## 2018-09-06 DIAGNOSIS — R54 Age-related physical debility: Secondary | ICD-10-CM | POA: Diagnosis not present

## 2018-09-06 DIAGNOSIS — I5033 Acute on chronic diastolic (congestive) heart failure: Secondary | ICD-10-CM | POA: Diagnosis not present

## 2018-09-06 DIAGNOSIS — M19071 Primary osteoarthritis, right ankle and foot: Secondary | ICD-10-CM | POA: Diagnosis not present

## 2018-09-06 DIAGNOSIS — D649 Anemia, unspecified: Secondary | ICD-10-CM | POA: Diagnosis not present

## 2018-09-06 DIAGNOSIS — F0390 Unspecified dementia without behavioral disturbance: Secondary | ICD-10-CM | POA: Diagnosis not present

## 2018-09-06 DIAGNOSIS — M2550 Pain in unspecified joint: Secondary | ICD-10-CM | POA: Diagnosis not present

## 2018-09-06 DIAGNOSIS — Z111 Encounter for screening for respiratory tuberculosis: Secondary | ICD-10-CM | POA: Diagnosis not present

## 2018-09-06 DIAGNOSIS — I5032 Chronic diastolic (congestive) heart failure: Secondary | ICD-10-CM | POA: Diagnosis not present

## 2018-09-06 DIAGNOSIS — R279 Unspecified lack of coordination: Secondary | ICD-10-CM | POA: Diagnosis not present

## 2018-09-06 DIAGNOSIS — R110 Nausea: Secondary | ICD-10-CM | POA: Diagnosis not present

## 2018-09-06 DIAGNOSIS — Q7132 Congenital absence of left hand and finger: Secondary | ICD-10-CM | POA: Diagnosis not present

## 2018-09-06 DIAGNOSIS — Z743 Need for continuous supervision: Secondary | ICD-10-CM | POA: Diagnosis not present

## 2018-09-06 DIAGNOSIS — E039 Hypothyroidism, unspecified: Secondary | ICD-10-CM | POA: Diagnosis not present

## 2018-09-06 DIAGNOSIS — Z993 Dependence on wheelchair: Secondary | ICD-10-CM | POA: Diagnosis not present

## 2018-09-06 DIAGNOSIS — Z933 Colostomy status: Secondary | ICD-10-CM | POA: Diagnosis not present

## 2018-09-06 DIAGNOSIS — I739 Peripheral vascular disease, unspecified: Secondary | ICD-10-CM | POA: Diagnosis not present

## 2018-09-06 DIAGNOSIS — I872 Venous insufficiency (chronic) (peripheral): Secondary | ICD-10-CM | POA: Diagnosis not present

## 2018-09-06 DIAGNOSIS — L03115 Cellulitis of right lower limb: Secondary | ICD-10-CM | POA: Diagnosis not present

## 2018-09-06 DIAGNOSIS — L97312 Non-pressure chronic ulcer of right ankle with fat layer exposed: Secondary | ICD-10-CM | POA: Diagnosis not present

## 2018-09-06 DIAGNOSIS — M5136 Other intervertebral disc degeneration, lumbar region: Secondary | ICD-10-CM | POA: Diagnosis not present

## 2018-09-06 DIAGNOSIS — N183 Chronic kidney disease, stage 3 (moderate): Secondary | ICD-10-CM | POA: Diagnosis not present

## 2018-09-06 DIAGNOSIS — M1380 Other specified arthritis, unspecified site: Secondary | ICD-10-CM | POA: Diagnosis not present

## 2018-09-06 DIAGNOSIS — R52 Pain, unspecified: Secondary | ICD-10-CM | POA: Diagnosis not present

## 2018-09-06 DIAGNOSIS — I10 Essential (primary) hypertension: Secondary | ICD-10-CM | POA: Diagnosis not present

## 2018-09-06 DIAGNOSIS — I480 Paroxysmal atrial fibrillation: Secondary | ICD-10-CM | POA: Diagnosis not present

## 2018-09-06 DIAGNOSIS — M810 Age-related osteoporosis without current pathological fracture: Secondary | ICD-10-CM | POA: Diagnosis not present

## 2018-09-06 DIAGNOSIS — Z7401 Bed confinement status: Secondary | ICD-10-CM | POA: Diagnosis not present

## 2018-09-06 DIAGNOSIS — I509 Heart failure, unspecified: Secondary | ICD-10-CM | POA: Diagnosis not present

## 2018-09-06 DIAGNOSIS — I4891 Unspecified atrial fibrillation: Secondary | ICD-10-CM | POA: Diagnosis not present

## 2018-09-06 DIAGNOSIS — L97311 Non-pressure chronic ulcer of right ankle limited to breakdown of skin: Secondary | ICD-10-CM | POA: Diagnosis present

## 2018-09-06 DIAGNOSIS — L309 Dermatitis, unspecified: Secondary | ICD-10-CM | POA: Diagnosis not present

## 2018-09-06 DIAGNOSIS — L0390 Cellulitis, unspecified: Secondary | ICD-10-CM | POA: Diagnosis not present

## 2018-09-06 DIAGNOSIS — E876 Hypokalemia: Secondary | ICD-10-CM | POA: Diagnosis not present

## 2018-09-06 DIAGNOSIS — I70233 Atherosclerosis of native arteries of right leg with ulceration of ankle: Secondary | ICD-10-CM | POA: Diagnosis not present

## 2018-09-06 DIAGNOSIS — R4182 Altered mental status, unspecified: Secondary | ICD-10-CM | POA: Diagnosis not present

## 2018-09-06 DIAGNOSIS — K5900 Constipation, unspecified: Secondary | ICD-10-CM | POA: Diagnosis not present

## 2018-09-06 DIAGNOSIS — M6281 Muscle weakness (generalized): Secondary | ICD-10-CM | POA: Diagnosis not present

## 2018-09-06 LAB — CBC
HCT: 30.3 % — ABNORMAL LOW (ref 36.0–46.0)
Hemoglobin: 9.6 g/dL — ABNORMAL LOW (ref 12.0–15.0)
MCH: 29.1 pg (ref 26.0–34.0)
MCHC: 31.7 g/dL (ref 30.0–36.0)
MCV: 91.8 fL (ref 80.0–100.0)
Platelets: 330 10*3/uL (ref 150–400)
RBC: 3.3 MIL/uL — AB (ref 3.87–5.11)
RDW: 17.2 % — ABNORMAL HIGH (ref 11.5–15.5)
WBC: 11 10*3/uL — ABNORMAL HIGH (ref 4.0–10.5)
nRBC: 0 % (ref 0.0–0.2)

## 2018-09-06 LAB — BASIC METABOLIC PANEL
Anion gap: 12 (ref 5–15)
BUN: 14 mg/dL (ref 8–23)
CO2: 21 mmol/L — ABNORMAL LOW (ref 22–32)
Calcium: 8.2 mg/dL — ABNORMAL LOW (ref 8.9–10.3)
Chloride: 104 mmol/L (ref 98–111)
Creatinine, Ser: 1.02 mg/dL — ABNORMAL HIGH (ref 0.44–1.00)
GFR calc Af Amer: 55 mL/min — ABNORMAL LOW (ref >60)
GFR calc non Af Amer: 47 mL/min — ABNORMAL LOW (ref >60)
GLUCOSE: 90 mg/dL (ref 70–99)
Potassium: 3.9 mmol/L (ref 3.5–5.1)
Sodium: 137 mmol/L (ref 135–145)

## 2018-09-06 MED ORDER — DOXYCYCLINE HYCLATE 100 MG PO TABS: 100.0000 mg | ORAL_TABLET | Freq: Two times a day (BID) | ORAL | 0 refills | Status: AC

## 2018-09-06 MED ORDER — BUMETANIDE 0.5 MG PO TABS: 0.5000 mg | ORAL_TABLET | Freq: Every day | ORAL | 0 refills | Status: AC

## 2018-09-06 MED ORDER — METOPROLOL TARTRATE 25 MG PO TABS: 12.5000 mg | ORAL_TABLET | Freq: Two times a day (BID) | ORAL | 2 refills | Status: AC

## 2018-09-06 MED ORDER — ACETAMINOPHEN 325 MG PO TABS: 650.0000 mg | ORAL_TABLET | Freq: Three times a day (TID) | ORAL | 0 refills | Status: AC | PRN

## 2018-09-06 MED ORDER — POTASSIUM CHLORIDE 20 MEQ PO PACK: 20.0000 meq | PACK | Freq: Every day | ORAL | 0 refills | Status: AC

## 2018-09-06 MED ORDER — ACETAMINOPHEN 325 MG PO TABS: 650.0000 mg | ORAL_TABLET | Freq: Three times a day (TID) | ORAL | 0 refills | Status: AC

## 2018-09-06 MED ORDER — ONDANSETRON 4 MG PO TBDP: 4.0000 mg | ORAL_TABLET | Freq: Three times a day (TID) | ORAL | 0 refills | Status: AC | PRN

## 2018-09-06 MED ORDER — OXYCODONE HCL 5 MG PO TABS: 5.0000 mg | ORAL_TABLET | ORAL | 0 refills | Status: AC | PRN

## 2018-09-06 MED ORDER — SENNOSIDES-DOCUSATE SODIUM 8.6-50 MG PO TABS: 2.0000 | ORAL_TABLET | Freq: Every day | ORAL | 2 refills | Status: AC

## 2018-09-06 MED ORDER — FERROUS SULFATE 325 (65 FE) MG PO TABS: 325.0000 mg | ORAL_TABLET | Freq: Every day | ORAL | 3 refills | Status: AC

## 2018-09-06 NOTE — Clinical Social Work Placement (Signed)
   CLINICAL SOCIAL WORK PLACEMENT  NOTE  Date:  09/06/2018  Patient Details  Name: [name] [name] MRN: [id number] Date of Birth: 05-16-1923  Clinical Social Work is seeking post-discharge placement for this patient at the [place] level of care (*CSW will initial, date and re-position this form in  chart as items are completed):  Yes   Patient/family provided with [place] Work Department's list of facilities offering this level of care within the geographic area requested by the patient (or if unable, by the patient's family).  Yes   Patient/family informed of their freedom to choose among providers that offer the needed level of care, that participate in Medicare, Medicaid or managed care program needed by the patient, have an available bed and are willing to accept the patient.  Yes   Patient/family informed of [facility]'s ownership interest in [facility] and [facility], as well as of the fact that they are under no obligation to receive care at these facilities.  PASRR submitted to EDS on       PASRR number received on       Existing PASRR number confirmed on 09/04/18     FL2 transmitted to all facilities in geographic area requested by pt/family on 09/04/18     FL2 transmitted to all facilities within larger geographic area on       Patient informed that his/her managed care company has contracts with or will negotiate with certain facilities, including the following:        Yes   Patient/family informed of bed offers received.  Patient chooses bed at [facility]     Physician recommends and patient chooses bed at      Patient to be transferred to [facility] on 09/06/18.  Patient to be transferred to facility by [facility]     Patient family notified on 09/06/18 of transfer.  Name of family member notified:  Beth, niece     PHYSICIAN       Additional Comment:    _______________________________________________ Benard Halsted,  LCSW 09/06/2018, 3:10 PM

## 2018-09-06 NOTE — Discharge Summary (Signed)
[name] [name], is a 84 y.o. female  DOB 07-22-1923  MRN [id number].  Admission date:  08/30/2018  Admitting Physician  Kayleen Memos, DO  Discharge Date:  09/06/2018   Primary MD  Lajean Manes, MD  Recommendations for primary care physician for things to follow:   1)You are taking Eliquis/apixaban for blood thinner so Avoid ibuprofen/Advil/Aleve/Motrin/Etodolac/Goody Powders/Naproxen/BC powders/Meloxicam/Diclofenac/Indomethacin and other Nonsteroidal anti-inflammatory medications as these will make you more likely to bleed and can cause stomach ulcers, can also cause Kidney problems.  2) wound care consult at skilled nursing facility please 3) wound dressings as advised by wound care specialist--Wound care consult and recommendation noted below as follows: Xeroform gauze Kellie Simmering [phone]) over the ankle. Secure with kerlex. Change daily; moisten with saline prior to removal to prevent stripping of skin 4) CBC and BMP blood test on Monday, 09/10/2018   Admission Diagnosis  Cellulitis of right lower extremity [L03.115]   Discharge Diagnosis  Cellulitis of right lower extremity [L03.115]    Active Problems:   Cellulitis      Past Medical History:  Diagnosis Date  . Anemia   . Arthritis   . CAD (coronary artery disease)   . Cancer [facility])    Endometrial  . Congenital disease    Left forearm  . Dementia ([place])   . Diverticulosis   . History of degenerative disc disease    Both knees  . Hypertension   . Osteoporosis    osteopenia  . Thyroid disease    Hypothyroidism    Past Surgical History:  Procedure Laterality Date  . ABDOMINAL HYSTERECTOMY    . BOWEL RESECTION    . COLOSTOMY  [id number]  . COLOSTOMY    . OTHER SURGICAL HISTORY    . ROTATOR CUFF REPAIR  1990s       HPI  from the history and physical done on the day of admission:    Chief Complaint: Severe right ankle and leg pain    HPI: [name] [name] is a 84 y.o. female with medical history significant for, paroxysmal A. fib on Eliquis, chronic diastolic CHF, status post colostomy who presented to [facility] ED from assisted living facility with complaints of worsening right lower extremity wound associated with severe pain.  Endorses she scratched her lateral right leg 3 or 4 days ago and in the last 24 hours noted some purulent discharge, redness, and severe tenderness with gentle touch.  She denies associated fevers or chills, nausea, vomiting, chest pain or dyspnea.  Patient is wheelchair-bound and minimally active.  ED Course: Vital signs essentially unremarkable. Lab studies remarkable for leukocytosis with WBC 15 K, mild drop in her baseline hemoglobin from 10.5 to 9.8, and elevated creatinine which appears to be at her bas    Hospital Course:     Brief Narrative: 84 year old with history of paroxysmal atrial fibrillation on Eliquis, chronic diastolic CHF, status post cholecystectomy came to the hospital from assisted living with complains of worsening of the right lower extremity wound and swelling. She was diagnosed with  right lower extremity cellulitis and started on IV antibiotics.  home meds: - metoprolol xl 12.5 bid/ Kdur 20 bid - trazodone 25 hs/ sertraline 25 qd - apixaban 2.5 bid - albuterol HFA prn/ levothyroxine 75 ug    Assessment & Plan:  1)Right lower extremity wound/ cellulitis:- Clinically improving, [place] negative for DVT, Plain films negative for soft tissue gas, CT ofR LEwith edema,negative for abscess, osteomyelitis, or myositis Vancomycin which was changed to Ancef+ podoxy... Will discharge on doxycycline monotherapy for 1 week Wound care consult and recommendation noted below as follows: Xeroform gauze Kellie Simmering [phone]) over the ankle. Secure with kerlex. Change daily; moisten with saline prior to removal to prevent stripping of skin  2)Chronic debility/ deconditioning/  frailty- -At baseline patient is wheelchair-bound. Lives at assisted living facility. ,  continue fall precautions... Transfer to SNF for rehab  3)History of Paroxysmal Atrial Fibrillation-Stable,  Continue Eliquis  2.5 mg twice daily for stroke prophylaxis and continue metoprolol xl 12.5 mg daily for rate control  4)HFpEF/Chronic diastolic congestive heart failure- last known EF 60 to 65%, clinically euvolemic, reduce Bumex to 0.5 mg nightly to avoid dehydration given less than stellar oral intake, also reduce potassium supplement  5)CKD stage III-- Creatinine appears to be at baseline , currently creatinine is 1.0  6)Hypothyroidism- clinically euthyroid, last TSH 1.2, continue levothyroxine  7)Iron deficiency anemia-Hemoglobin is above 9, no evidence of ongoing bleeding, continue iron supplements.  8)History of depression and anxiety, chronic Continue Zoloft  9) constipation and nausea--- no emesis, constipation will probably worsen with narcotics, okay to schedule MiraLAX nightly may use Senokot-S nightly   DVT prophylaxis:Eliquis  Discharge Condition: STABLE   Follow UP --- with PCP as previously advised   Consults obtained -wound care consultants  Diet and Activity recommendation:  As advised  Discharge Instructions    Discharge Instructions    Call MD for:  difficulty breathing, headache or visual disturbances   Complete by:  As directed    Call MD for:  persistant dizziness or light-headedness   Complete by:  As directed    Call MD for:  persistant nausea and vomiting   Complete by:  As directed    Call MD for:  severe uncontrolled pain   Complete by:  As directed    Call MD for:  temperature >100.4   Complete by:  As directed    Diet - low sodium heart healthy   Complete by:  As directed    Discharge instructions   Complete by:  As directed    1) you are taking Eliquis/apixaban for blood thinner so Avoid ibuprofen/Advil/Aleve/Motrin/Etodolac/Goody  Powders/Naproxen/BC powders/Meloxicam/Diclofenac/Indomethacin and other Nonsteroidal anti-inflammatory medications as these will make you more likely to bleed and can cause stomach ulcers, can also cause Kidney problems.  2) wound care consult at skilled nursing facility please 3) wound dressings as advised by wound care specialist--Wound care consult and recommendation noted below as follows: Xeroform gauze Kellie Simmering [phone]) over the ankle. Secure with kerlex. Change daily; moisten with saline prior to removal to prevent stripping of skin 4) CBC and BMP blood test on Monday, 09/10/2018   Increase activity slowly   Complete by:  As directed         Discharge Medications     Allergies as of 09/06/2018      Reactions   Sulfa Antibiotics Hives   Penicillins Itching, Rash   Tolerates ceftriaxone and cefdinir  Has patient had a PCN reaction causing immediate rash, facial/tongue/throat swelling, SOB or lightheadedness  with hypotension: Yes Has patient had a PCN reaction causing severe rash involving mucus membranes or skin necrosis: Unk Has patient had a PCN reaction that required hospitalization: Unk Has patient had a PCN reaction occurring within the last 10 years: Unk If all of the above answers are "NO", then may proceed with Cephalosporin use.      Medication List    STOP taking these medications   HYDROcodone-acetaminophen 5-325 MG tablet Commonly known as:  NORCO/VICODIN   levofloxacin 500 MG tablet Commonly known as:  LEVAQUIN   potassium chloride SA 20 MEQ tablet Commonly known as:  K-DUR,KLOR-CON   traMADol 50 MG tablet Commonly known as:  ULTRAM     TAKE these medications   acetaminophen 325 MG tablet Commonly known as:  TYLENOL Take 2 tablets (650 mg total) by mouth 3 (three) times daily for 14 days.   acetaminophen 325 MG tablet Commonly known as:  TYLENOL Take 2 tablets (650 mg total) by mouth every 8 (eight) hours as needed for mild pain, moderate pain, fever or  headache.   albuterol (2.5 MG/3ML) 0.083% nebulizer solution Commonly known as:  PROVENTIL Take 2.5 mg by nebulization every 8 (eight) hours as needed for wheezing or shortness of breath. What changed:  Another medication with the same name was removed. Continue taking this medication, and follow the directions you see here.   apixaban 2.5 MG Tabs tablet Commonly known as:  ELIQUIS Take 1 tablet (2.5 mg total) by mouth 2 (two) times daily.   benzonatate 100 MG capsule Commonly known as:  TESSALON Take 100 mg by mouth 3 (three) times daily as needed for cough.   bumetanide 0.5 MG tablet Commonly known as:  BUMEX Take 1 tablet (0.5 mg total) by mouth daily. What changed:    medication strength  how much to take  when to take this   doxycycline 100 MG tablet Commonly known as:  VIBRA-TABS Take 1 tablet (100 mg total) by mouth 2 (two) times daily for 7 days.   eucerin cream Apply 1 application topically daily as needed for dry skin.   eucerin cream Apply 1 application topically 3 (three) times daily. To left side of neck   feeding supplement (ENSURE ENLIVE) Liqd Take 237 mLs by mouth 3 (three) times daily between meals.   ferrous sulfate 325 (65 FE) MG tablet Take 1 tablet (325 mg total) by mouth daily with breakfast. What changed:  when to take this   fluticasone 50 MCG/ACT nasal spray Commonly known as:  FLONASE Place 2 sprays into both nostrils daily.   guaiFENesin-dextromethorphan 100-10 MG/5ML syrup Commonly known as:  ROBITUSSIN DM Take 5 mLs by mouth every 4 (four) hours as needed for cough.   ipratropium-albuterol 0.5-2.5 (3) MG/3ML Soln Commonly known as:  DUONEB Take 3 mLs by nebulization 3 (three) times daily.   levothyroxine 75 MCG tablet Commonly known as:  SYNTHROID, LEVOTHROID Take 75 mcg by mouth daily before breakfast.   loratadine 10 MG tablet Commonly known as:  CLARITIN Take 10 mg by mouth daily.   Melatonin 5 MG Tabs Take 5 mg by mouth  at bedtime.   metoprolol tartrate 25 MG tablet Commonly known as:  LOPRESSOR Take 0.5 tablets (12.5 mg total) by mouth 2 (two) times daily.   multivitamin with minerals Tabs tablet Take 1 tablet by mouth daily.   ondansetron 4 MG disintegrating tablet Commonly known as:  ZOFRAN-ODT Take 1 tablet (4 mg total) by mouth every 8 (eight) hours as  needed for nausea or vomiting.   oxyCODONE 5 MG immediate release tablet Commonly known as:  Oxy IR/ROXICODONE Take 1 tablet (5 mg total) by mouth every 4 (four) hours as needed for moderate pain or severe pain.   polyethylene glycol packet Commonly known as:  MIRALAX / GLYCOLAX Take 17 g by mouth daily.   potassium chloride 20 MEQ packet Commonly known as:  KLOR-CON Take 20 mEq by mouth daily. What changed:  when to take this   senna-docusate 8.6-50 MG tablet Commonly known as:  Senokot-S Take 2 tablets by mouth at bedtime.   sertraline 25 MG tablet Commonly known as:  ZOLOFT Take 25 mg by mouth daily.   SYSTANE ULTRA PF 0.4-0.3 % Soln Generic drug:  Polyethyl Glyc-Propyl Glyc PF Place 1 drop into both eyes 4 (four) times daily as needed (for dry eyes).   SYSTANE ULTRA 0.4-0.3 % Soln Generic drug:  Polyethyl Glycol-Propyl Glycol Place 1 drop into both eyes 4 (four) times daily.   traZODone 50 MG tablet Commonly known as:  DESYREL Take 25 mg by mouth at bedtime.       Major procedures and Radiology Reports - PLEASE review detailed and final reports for all details, in brief -   Dg Tibia/fibula Right  Result Date: 08/30/2018 CLINICAL DATA:  Infection, swelling.  Rule out soft tissue gas. EXAM: RIGHT TIBIA AND FIBULA - 2 VIEW COMPARISON:  None. FINDINGS: No soft tissue gas.  No acute or suspicious osseous finding. IMPRESSION: Negative. Electronically Signed   By: Franki Cabot M.D.   On: 08/30/2018 06:55   Ct Extremity Lower Right Wo Contrast  Result Date: 08/31/2018 CLINICAL DATA:  The patient scratch the lateral side of  the right lower leg 3 or 4 days ago. Right lower extremity redness and swelling. EXAM: CT OF THE LOWER RIGHT EXTREMITY WITHOUT CONTRAST TECHNIQUE: Multidetector CT imaging of the right lower extremity was performed according to the standard protocol. COMPARISON:  None. FINDINGS: Bones/Joint/Cartilage No acute bony abnormality is identified. No periosteal reaction or bony destructive change. There is some patellofemoral osteoarthritis. Ligaments Suboptimally assessed by CT. Muscles and Tendons No intramuscular fluid collection or gas. Mild fatty atrophy of lower leg musculature is noted. Soft tissues No focal fluid collection, soft tissue gas or foreign body is identified. Subcutaneous edema is present about the ankle bilaterally, worse on the medial side. IMPRESSION: Subcutaneous edema about the ankle is worse on the medial side and could be due to dependent change or cellulitis. Negative for abscess, osteomyelitis or myositis. Mild patellofemoral osteoarthritis. Electronically Signed   By: Inge Rise M.D.   On: 08/31/2018 18:55   Vas [place] Lower Extremity Venous (dvt)  Result Date: 08/30/2018  Lower Venous Study Indications: Pain, Swelling, Erythema, and cellulitis.  Limitations: Pain with touch. Comparison Study: No prior study on file for comparison Performing Technologist: Sharion Dove RVS  Examination Guidelines: A complete evaluation includes B-mode imaging, spectral Doppler, color Doppler, and power Doppler as needed of all accessible portions of each vessel. Bilateral testing is considered an integral part of a complete examination. Limited examinations for reoccurring indications may be performed as noted.  Right Venous Findings: +---------+---------------+---------+-----------+----------+-------+          CompressibilityPhasicitySpontaneityPropertiesSummary +---------+---------------+---------+-----------+----------+-------+ CFV      Full                                                  +---------+---------------+---------+-----------+----------+-------+  SFJ      Full                                                 +---------+---------------+---------+-----------+----------+-------+ FV Prox  Full                                                 +---------+---------------+---------+-----------+----------+-------+ FV Mid   Full                                                 +---------+---------------+---------+-----------+----------+-------+ FV DistalFull                                                 +---------+---------------+---------+-----------+----------+-------+ PFV      Full                                                 +---------+---------------+---------+-----------+----------+-------+ POP                     Yes      Yes                          +---------+---------------+---------+-----------+----------+-------+ GSV      Full                                                 +---------+---------------+---------+-----------+----------+-------+  Right Technical Findings: Not visualized segments include posterior tibial and peroneal veins.  Left Venous Findings: +---+---------------+---------+-----------+----------+-------+    CompressibilityPhasicitySpontaneityPropertiesSummary +---+---------------+---------+-----------+----------+-------+ CFVFull           Yes      Yes                          +---+---------------+---------+-----------+----------+-------+    Summary: Right: There is no evidence of deep vein thrombosis in the lower extremity. However, portions of this examination were limited- see technologist comments above.There is no evidence of superficial venous thrombosis. Left: No evidence of common femoral vein obstruction.  *See table(s) above for measurements and observations. Electronically signed by Servando Snare MD on 08/30/2018 at 5:59:12 PM.    Final     Micro Results   Recent Results (from the past 240  hour(s))  MRSA PCR Screening     Status: None   Collection Time: 08/30/18  8:09 AM  Result Value Ref Range Status   MRSA by PCR NEGATIVE NEGATIVE Final    Comment:        The GeneXpert MRSA Assay (FDA approved for NASAL specimens only), is one component of a comprehensive MRSA colonization surveillance program. It is not intended to diagnose MRSA infection  nor to guide or monitor treatment for MRSA infections. Performed at [facility], [place] [address] [place], [place] [address]     Today   Subjective    [name] [name] today has no new complaints, her intake was fair today, no fevers,          Patient has been seen and examined prior to discharge   Objective   Blood pressure (!) 155/65, pulse 84, temperature 98.1 F (36.7 C), temperature source Oral, resp. rate 19, height 5\' 6"  (1.676 m), weight 78.2 kg, SpO2 93 %.   Intake/Output Summary (Last 24 hours) at 09/06/2018 1416 Last data filed at 09/06/2018 1228 Gross per 24 hour  Intake 814.51 ml  Output 1000 ml  Net -185.49 ml    Exam Patient is examined daily including today on 09/06/18 , exams remain the same as of yesterday except that has changed   Gen:- Awake Alert, in no apparent distress  HEENT:- [place].AT, No sclera icterus Neck-Supple Neck,No JVD,.  Lungs-  CTAB , fair symmetrical air movement CV- S1, S2 normal, regular  Abd-  +ve B.Sounds, Abd Soft, No tenderness,    Extremity/Skin:- R medial foot wound/ blister/ hematoma is opened and is large but no drainage/pus or odor, overall erythema and swelling appears to be improving, dressing in place, please see photo below Psych-affect is appropriate, oriented x3 Neuro-no new focal deficits, no tremors Media Information   Document Information   Photos  Cellulitis   08/31/2018 12:50  Attached To:  Hospital Encounter on 08/30/18  Source Information   Elodia Florence., MD  [facility]      Data Review   CBC w Diff:  Lab Results    Component Value Date   WBC 11.0 (H) 09/06/2018   HGB 9.6 (L) 09/06/2018   HCT 30.3 (L) 09/06/2018   PLT 330 09/06/2018   LYMPHOPCT 6 08/30/2018   BANDSPCT 2 06/30/2018   MONOPCT 6 08/30/2018   EOSPCT 2 08/30/2018   BASOPCT 0 08/30/2018    CMP:  Lab Results  Component Value Date   NA 137 09/06/2018   K 3.9 09/06/2018   CL 104 09/06/2018   CO2 21 (L) 09/06/2018   BUN 14 09/06/2018   CREATININE 1.02 (H) 09/06/2018   PROT 5.8 (L) 09/01/2018   ALBUMIN 1.9 (L) 09/01/2018   BILITOT 0.5 09/01/2018   ALKPHOS 68 09/01/2018   AST 23 09/01/2018   ALT 18 09/01/2018    Total Discharge time is about 33 minutes  Roxan Hockey M.D on 09/06/2018 at 2:16 PM  Pager---[phone]  Go to [email or web address] - password [email or web address] for contact info  [facility] - Office  [phone]

## 2018-09-06 NOTE — Progress Notes (Addendum)
Patient will DC to: [facility] Anticipated DC date: 09/06/18 Family notified: Niece Transport by: Corey Harold   Per MD patient ready for DC to [facility]. RN, patient, patient's family, and facility notified of DC. Discharge Summary and FL2 sent to facility. RN to call report prior to discharge [phone] Room 612B). DC packet on chart. Ambulance transport requested for patient.   CSW will sign off for now as social work intervention is no longer needed. Please consult [place] again if new needs arise.  Cedric Fishman, LCSW Clinical Social Worker [phone]

## 2018-09-06 NOTE — Progress Notes (Signed)
Patient's niece requests [facility]. They are reviewing to see if they get Medicare to approve her since she was recently at [facility] in October.   Percell Locus Jacquette Canales LCSW [phone]

## 2018-09-07 DIAGNOSIS — R54 Age-related physical debility: Secondary | ICD-10-CM | POA: Diagnosis not present

## 2018-09-07 DIAGNOSIS — K5900 Constipation, unspecified: Secondary | ICD-10-CM | POA: Diagnosis not present

## 2018-09-07 DIAGNOSIS — I5032 Chronic diastolic (congestive) heart failure: Secondary | ICD-10-CM | POA: Diagnosis not present

## 2018-09-07 DIAGNOSIS — L03115 Cellulitis of right lower limb: Secondary | ICD-10-CM | POA: Diagnosis not present

## 2018-09-07 DIAGNOSIS — F339 Major depressive disorder, recurrent, unspecified: Secondary | ICD-10-CM | POA: Diagnosis not present

## 2018-09-07 DIAGNOSIS — E039 Hypothyroidism, unspecified: Secondary | ICD-10-CM | POA: Diagnosis not present

## 2018-09-07 DIAGNOSIS — I480 Paroxysmal atrial fibrillation: Secondary | ICD-10-CM | POA: Diagnosis not present

## 2018-09-07 DIAGNOSIS — R110 Nausea: Secondary | ICD-10-CM | POA: Diagnosis not present

## 2018-09-07 DIAGNOSIS — N183 Chronic kidney disease, stage 3 (moderate): Secondary | ICD-10-CM | POA: Diagnosis not present

## 2018-09-07 DIAGNOSIS — M6281 Muscle weakness (generalized): Secondary | ICD-10-CM | POA: Diagnosis not present

## 2018-09-07 DIAGNOSIS — F419 Anxiety disorder, unspecified: Secondary | ICD-10-CM | POA: Diagnosis not present

## 2018-09-07 DIAGNOSIS — D649 Anemia, unspecified: Secondary | ICD-10-CM | POA: Diagnosis not present

## 2018-09-10 DIAGNOSIS — L03115 Cellulitis of right lower limb: Secondary | ICD-10-CM | POA: Diagnosis not present

## 2018-09-10 DIAGNOSIS — I5032 Chronic diastolic (congestive) heart failure: Secondary | ICD-10-CM | POA: Diagnosis not present

## 2018-09-10 DIAGNOSIS — D649 Anemia, unspecified: Secondary | ICD-10-CM | POA: Diagnosis not present

## 2018-09-10 DIAGNOSIS — F419 Anxiety disorder, unspecified: Secondary | ICD-10-CM | POA: Diagnosis not present

## 2018-09-10 DIAGNOSIS — K5900 Constipation, unspecified: Secondary | ICD-10-CM | POA: Diagnosis not present

## 2018-09-10 DIAGNOSIS — R54 Age-related physical debility: Secondary | ICD-10-CM | POA: Diagnosis not present

## 2018-09-10 DIAGNOSIS — F339 Major depressive disorder, recurrent, unspecified: Secondary | ICD-10-CM | POA: Diagnosis not present

## 2018-09-10 DIAGNOSIS — E039 Hypothyroidism, unspecified: Secondary | ICD-10-CM | POA: Diagnosis not present

## 2018-09-10 DIAGNOSIS — M6281 Muscle weakness (generalized): Secondary | ICD-10-CM | POA: Diagnosis not present

## 2018-09-10 DIAGNOSIS — R110 Nausea: Secondary | ICD-10-CM | POA: Diagnosis not present

## 2018-09-10 DIAGNOSIS — I480 Paroxysmal atrial fibrillation: Secondary | ICD-10-CM | POA: Diagnosis not present

## 2018-09-10 DIAGNOSIS — N183 Chronic kidney disease, stage 3 (moderate): Secondary | ICD-10-CM | POA: Diagnosis not present

## 2018-09-11 ENCOUNTER — Non-Acute Institutional Stay: Payer: Medicare Other | Admitting: Licensed Clinical Social Worker

## 2018-09-11 DIAGNOSIS — I5032 Chronic diastolic (congestive) heart failure: Secondary | ICD-10-CM | POA: Diagnosis not present

## 2018-09-11 DIAGNOSIS — Z515 Encounter for palliative care: Secondary | ICD-10-CM

## 2018-09-11 DIAGNOSIS — N183 Chronic kidney disease, stage 3 (moderate): Secondary | ICD-10-CM | POA: Diagnosis not present

## 2018-09-11 DIAGNOSIS — R110 Nausea: Secondary | ICD-10-CM | POA: Diagnosis not present

## 2018-09-11 DIAGNOSIS — L03115 Cellulitis of right lower limb: Secondary | ICD-10-CM | POA: Diagnosis not present

## 2018-09-11 DIAGNOSIS — M6281 Muscle weakness (generalized): Secondary | ICD-10-CM | POA: Diagnosis not present

## 2018-09-11 DIAGNOSIS — I480 Paroxysmal atrial fibrillation: Secondary | ICD-10-CM | POA: Diagnosis not present

## 2018-09-11 DIAGNOSIS — R52 Pain, unspecified: Secondary | ICD-10-CM | POA: Diagnosis not present

## 2018-09-11 DIAGNOSIS — J449 Chronic obstructive pulmonary disease, unspecified: Secondary | ICD-10-CM | POA: Diagnosis not present

## 2018-09-11 DIAGNOSIS — K5900 Constipation, unspecified: Secondary | ICD-10-CM | POA: Diagnosis not present

## 2018-09-11 DIAGNOSIS — D649 Anemia, unspecified: Secondary | ICD-10-CM | POA: Diagnosis not present

## 2018-09-11 DIAGNOSIS — E039 Hypothyroidism, unspecified: Secondary | ICD-10-CM | POA: Diagnosis not present

## 2018-09-11 DIAGNOSIS — L309 Dermatitis, unspecified: Secondary | ICD-10-CM | POA: Diagnosis not present

## 2018-09-12 NOTE — Progress Notes (Signed)
COMMUNITY PALLIATIVE CARE SW NOTE  PATIENT NAME: [name] [name] DOB: 09/29/1923 MRN: [id number]  PRIMARY CARE PROVIDER: Stoneking, Hal, MD  RESPONSIBLE PARTY:  Acct ID - Guarantor Home Phone Work Phone Relationship Acct Type  [id number] - Lukins,Kylia [phone]  Self P/F     [address], [place], [place] [address]     PLAN OF CARE and INTERVENTIONS:             1. GOALS OF CARE/ ADVANCE CARE PLANNING:  Goal is for patient to return to [facility].  She has a DNR. 2. SOCIAL/EMOTIONAL/SPIRITUAL ASSESSMENT/ INTERVENTIONS:  SW met with patient at [facility], where she was admitted for rehab, from the hospital on 09/06/18.  Patient was in her room in her recliner.  She appeared to be sleeping comfortably and did not respond to verbal/tactile prompts.  She did not display any nonverbal indicators of pain.  Consulted Robert, patient's CNA.  No concerns were expressed at this time. 3. PATIENT/CAREGIVER EDUCATION/ COPING:  SW provided education to facility staff regarding Palliative Care.   4. PERSONAL EMERGENCY PLAN:  Per facility protocial. 5. COMMUNITY RESOURCES COORDINATION/ HEALTH CARE NAVIGATION:  None.. 6. FINANCIAL/LEGAL CONCERNS/INTERVENTIONS:  None.         SOCIAL HX:  Social History   Tobacco Use  . Smoking status: Former Smoker  . Smokeless tobacco: Never Used  Substance Use Topics  . Alcohol use: No    CODE STATUS:  DNR  ADVANCED DIRECTIVES:  HCPOA.  Patient's niece, [name] [name], is her legal guardian. MOST FORM COMPLETE:  Not in chart. HOSPICE EDUCATION PROVIDED: No PPS:  Patient's appetite has decreased.  She is w/c bound. Duration of visit and documentation:  30 minutes.       Z , LCSW 

## 2018-09-18 DIAGNOSIS — R110 Nausea: Secondary | ICD-10-CM | POA: Diagnosis not present

## 2018-09-18 DIAGNOSIS — M6281 Muscle weakness (generalized): Secondary | ICD-10-CM | POA: Diagnosis not present

## 2018-09-18 DIAGNOSIS — J449 Chronic obstructive pulmonary disease, unspecified: Secondary | ICD-10-CM | POA: Diagnosis not present

## 2018-09-18 DIAGNOSIS — D649 Anemia, unspecified: Secondary | ICD-10-CM | POA: Diagnosis not present

## 2018-09-18 DIAGNOSIS — E039 Hypothyroidism, unspecified: Secondary | ICD-10-CM | POA: Diagnosis not present

## 2018-09-18 DIAGNOSIS — I5032 Chronic diastolic (congestive) heart failure: Secondary | ICD-10-CM | POA: Diagnosis not present

## 2018-09-18 DIAGNOSIS — K5900 Constipation, unspecified: Secondary | ICD-10-CM | POA: Diagnosis not present

## 2018-09-18 DIAGNOSIS — N183 Chronic kidney disease, stage 3 (moderate): Secondary | ICD-10-CM | POA: Diagnosis not present

## 2018-09-18 DIAGNOSIS — R52 Pain, unspecified: Secondary | ICD-10-CM | POA: Diagnosis not present

## 2018-09-18 DIAGNOSIS — I480 Paroxysmal atrial fibrillation: Secondary | ICD-10-CM | POA: Diagnosis not present

## 2018-09-18 DIAGNOSIS — L309 Dermatitis, unspecified: Secondary | ICD-10-CM | POA: Diagnosis not present

## 2018-09-18 DIAGNOSIS — L03115 Cellulitis of right lower limb: Secondary | ICD-10-CM | POA: Diagnosis not present

## 2018-09-20 DIAGNOSIS — R52 Pain, unspecified: Secondary | ICD-10-CM | POA: Diagnosis not present

## 2018-09-20 DIAGNOSIS — I5032 Chronic diastolic (congestive) heart failure: Secondary | ICD-10-CM | POA: Diagnosis not present

## 2018-09-20 DIAGNOSIS — N183 Chronic kidney disease, stage 3 (moderate): Secondary | ICD-10-CM | POA: Diagnosis not present

## 2018-09-20 DIAGNOSIS — D649 Anemia, unspecified: Secondary | ICD-10-CM | POA: Diagnosis not present

## 2018-09-20 DIAGNOSIS — M6281 Muscle weakness (generalized): Secondary | ICD-10-CM | POA: Diagnosis not present

## 2018-09-20 DIAGNOSIS — L03115 Cellulitis of right lower limb: Secondary | ICD-10-CM | POA: Diagnosis not present

## 2018-09-20 DIAGNOSIS — I480 Paroxysmal atrial fibrillation: Secondary | ICD-10-CM | POA: Diagnosis not present

## 2018-09-20 DIAGNOSIS — L309 Dermatitis, unspecified: Secondary | ICD-10-CM | POA: Diagnosis not present

## 2018-09-20 DIAGNOSIS — J449 Chronic obstructive pulmonary disease, unspecified: Secondary | ICD-10-CM | POA: Diagnosis not present

## 2018-09-20 DIAGNOSIS — K5900 Constipation, unspecified: Secondary | ICD-10-CM | POA: Diagnosis not present

## 2018-09-20 DIAGNOSIS — R110 Nausea: Secondary | ICD-10-CM | POA: Diagnosis not present

## 2018-09-20 DIAGNOSIS — E039 Hypothyroidism, unspecified: Secondary | ICD-10-CM | POA: Diagnosis not present

## 2018-09-21 ENCOUNTER — Encounter (HOSPITAL_BASED_OUTPATIENT_CLINIC_OR_DEPARTMENT_OTHER): Payer: Medicare Other | Attending: Internal Medicine

## 2018-09-21 DIAGNOSIS — L03115 Cellulitis of right lower limb: Secondary | ICD-10-CM | POA: Diagnosis not present

## 2018-09-21 DIAGNOSIS — I872 Venous insufficiency (chronic) (peripheral): Secondary | ICD-10-CM | POA: Insufficient documentation

## 2018-09-21 DIAGNOSIS — L97312 Non-pressure chronic ulcer of right ankle with fat layer exposed: Secondary | ICD-10-CM | POA: Insufficient documentation

## 2018-09-21 DIAGNOSIS — Z993 Dependence on wheelchair: Secondary | ICD-10-CM | POA: Insufficient documentation

## 2018-09-21 DIAGNOSIS — R4182 Altered mental status, unspecified: Secondary | ICD-10-CM | POA: Diagnosis not present

## 2018-09-21 DIAGNOSIS — J449 Chronic obstructive pulmonary disease, unspecified: Secondary | ICD-10-CM | POA: Insufficient documentation

## 2018-09-21 DIAGNOSIS — Q7132 Congenital absence of left hand and finger: Secondary | ICD-10-CM | POA: Diagnosis not present

## 2018-09-21 DIAGNOSIS — I70233 Atherosclerosis of native arteries of right leg with ulceration of ankle: Secondary | ICD-10-CM | POA: Insufficient documentation

## 2018-09-21 DIAGNOSIS — Z933 Colostomy status: Secondary | ICD-10-CM | POA: Diagnosis not present

## 2018-10-02 DIAGNOSIS — R0902 Hypoxemia: Secondary | ICD-10-CM | POA: Diagnosis not present

## 2018-10-02 DIAGNOSIS — R0989 Other specified symptoms and signs involving the circulatory and respiratory systems: Secondary | ICD-10-CM | POA: Diagnosis not present

## 2018-10-02 DIAGNOSIS — R918 Other nonspecific abnormal finding of lung field: Secondary | ICD-10-CM | POA: Diagnosis not present

## 2018-10-02 DIAGNOSIS — R509 Fever, unspecified: Secondary | ICD-10-CM | POA: Diagnosis not present

## 2018-10-02 DIAGNOSIS — R05 Cough: Secondary | ICD-10-CM | POA: Diagnosis not present

## 2018-10-05 ENCOUNTER — Encounter (HOSPITAL_BASED_OUTPATIENT_CLINIC_OR_DEPARTMENT_OTHER): Payer: Medicare Other | Attending: Internal Medicine

## 2018-10-05 DIAGNOSIS — I70233 Atherosclerosis of native arteries of right leg with ulceration of ankle: Secondary | ICD-10-CM | POA: Diagnosis not present

## 2018-10-05 DIAGNOSIS — L97312 Non-pressure chronic ulcer of right ankle with fat layer exposed: Secondary | ICD-10-CM | POA: Diagnosis not present

## 2018-10-05 DIAGNOSIS — S91001A Unspecified open wound, right ankle, initial encounter: Secondary | ICD-10-CM | POA: Diagnosis not present

## 2018-10-06 DIAGNOSIS — R918 Other nonspecific abnormal finding of lung field: Secondary | ICD-10-CM | POA: Diagnosis not present

## 2018-10-06 DIAGNOSIS — J9620 Acute and chronic respiratory failure, unspecified whether with hypoxia or hypercapnia: Secondary | ICD-10-CM | POA: Diagnosis not present

## 2018-10-06 DIAGNOSIS — G894 Chronic pain syndrome: Secondary | ICD-10-CM | POA: Diagnosis not present

## 2018-10-08 DIAGNOSIS — D638 Anemia in other chronic diseases classified elsewhere: Secondary | ICD-10-CM | POA: Diagnosis not present

## 2018-10-08 DIAGNOSIS — S91002A Unspecified open wound, left ankle, initial encounter: Secondary | ICD-10-CM | POA: Diagnosis not present

## 2018-10-08 DIAGNOSIS — I70209 Unspecified atherosclerosis of native arteries of extremities, unspecified extremity: Secondary | ICD-10-CM | POA: Diagnosis not present

## 2018-10-08 DIAGNOSIS — F0390 Unspecified dementia without behavioral disturbance: Secondary | ICD-10-CM | POA: Diagnosis not present

## 2018-10-08 DIAGNOSIS — I5030 Unspecified diastolic (congestive) heart failure: Secondary | ICD-10-CM | POA: Diagnosis not present

## 2018-10-08 DIAGNOSIS — J301 Allergic rhinitis due to pollen: Secondary | ICD-10-CM | POA: Diagnosis not present

## 2018-10-08 DIAGNOSIS — E43 Unspecified severe protein-calorie malnutrition: Secondary | ICD-10-CM | POA: Diagnosis not present

## 2018-10-08 DIAGNOSIS — J189 Pneumonia, unspecified organism: Secondary | ICD-10-CM | POA: Diagnosis not present

## 2018-10-08 DIAGNOSIS — E440 Moderate protein-calorie malnutrition: Secondary | ICD-10-CM | POA: Diagnosis not present

## 2018-10-08 DIAGNOSIS — R0902 Hypoxemia: Secondary | ICD-10-CM | POA: Diagnosis not present

## 2018-10-08 DIAGNOSIS — Z933 Colostomy status: Secondary | ICD-10-CM | POA: Diagnosis not present

## 2018-10-08 DIAGNOSIS — I4891 Unspecified atrial fibrillation: Secondary | ICD-10-CM | POA: Diagnosis not present

## 2018-10-08 DIAGNOSIS — R1312 Dysphagia, oropharyngeal phase: Secondary | ICD-10-CM | POA: Diagnosis not present

## 2018-10-08 DIAGNOSIS — I10 Essential (primary) hypertension: Secondary | ICD-10-CM | POA: Diagnosis not present

## 2018-10-08 DIAGNOSIS — J069 Acute upper respiratory infection, unspecified: Secondary | ICD-10-CM | POA: Diagnosis not present

## 2018-10-08 DIAGNOSIS — N183 Chronic kidney disease, stage 3 (moderate): Secondary | ICD-10-CM | POA: Diagnosis not present

## 2018-10-08 DIAGNOSIS — F411 Generalized anxiety disorder: Secondary | ICD-10-CM | POA: Diagnosis not present

## 2018-10-08 DIAGNOSIS — I25119 Atherosclerotic heart disease of native coronary artery with unspecified angina pectoris: Secondary | ICD-10-CM | POA: Diagnosis not present

## 2018-10-08 DIAGNOSIS — L03115 Cellulitis of right lower limb: Secondary | ICD-10-CM | POA: Diagnosis not present

## 2018-10-08 DIAGNOSIS — E039 Hypothyroidism, unspecified: Secondary | ICD-10-CM | POA: Diagnosis not present

## 2018-10-09 ENCOUNTER — Non-Acute Institutional Stay: Payer: Medicare Other | Admitting: *Deleted

## 2018-10-09 VITALS — BP 125/64 | HR 77 | Temp 98.2°F | Resp 18

## 2018-10-09 DIAGNOSIS — S91002A Unspecified open wound, left ankle, initial encounter: Secondary | ICD-10-CM | POA: Diagnosis not present

## 2018-10-09 DIAGNOSIS — J189 Pneumonia, unspecified organism: Secondary | ICD-10-CM | POA: Diagnosis not present

## 2018-10-09 DIAGNOSIS — R0902 Hypoxemia: Secondary | ICD-10-CM | POA: Diagnosis not present

## 2018-10-09 DIAGNOSIS — I5030 Unspecified diastolic (congestive) heart failure: Secondary | ICD-10-CM | POA: Diagnosis not present

## 2018-10-09 DIAGNOSIS — E43 Unspecified severe protein-calorie malnutrition: Secondary | ICD-10-CM | POA: Diagnosis not present

## 2018-10-09 DIAGNOSIS — L03115 Cellulitis of right lower limb: Secondary | ICD-10-CM | POA: Diagnosis not present

## 2018-10-09 DIAGNOSIS — Z515 Encounter for palliative care: Secondary | ICD-10-CM

## 2018-10-09 NOTE — Progress Notes (Signed)
COMMUNITY PALLIATIVE CARE RN NOTE  PATIENT NAME: [name] [name] DOB: [birth year] MRN: [id number]  PRIMARY CARE PROVIDER: Lajean Manes, MD  RESPONSIBLE PARTY:  Acct ID - Guarantor Home Phone Work Phone Relationship Acct Type  [id number] - Fauth,Aylee [phone]  Self P/F     [place], [place], [place] [address]    PLAN OF CARE and INTERVENTION:  1. ADVANCE CARE PLANNING/GOALS OF CARE: She wants to return to [facility] once Rehab is complete. She is a DNR. 2. PATIENT/CAREGIVER EDUCATION: Reinforced Safe Transfers and Pain Management 3. DISEASE STATUS: Met with patient in her room at the facility. She was asleep initially, but aroused easily with verbal stimulation. She denies pain at time of visit, but does state that she has pain in her legs at times, especially when touched. She has a wound noted to R lower leg, which is currently wrapped with Kerlix. Dressing is dry and intact. She is not wearing her Oxygen. It is lying on the bed beside her. She does not feel that she needs to wear it. She denies dyspnea. Sats today on room air is 98%. She has a nonproductive, congested cough. She states that it is much better than it was a few weeks ago. She spends most of her day sitting up in her recliner. She continues with PT/OT. She feels that she is progressing, however she is unable to ambulate or stand. She requires 2 person assistance with transfers and toileting and 1 person assistance with bathing and dressing. She is able to feed herself independently. She wishes to return to [facility]. She works crossword puzzles to pass the time during the day. Will continue to monitor.   HISTORY OF PRESENT ILLNESS:  This is a 84 yo female who currently resides at [facility] on the SNF unit for Rehab. Palliative Care Team continues to follow patient. Will visit monthly and PRN.  CODE STATUS: DNR ADVANCED DIRECTIVES: Y MOST FORM: no PPS: 30%   PHYSICAL EXAM:   VITALS: Today's Vitals   10/09/18 1130  BP: 125/64  Pulse: 77  Resp: 18  Temp: 98.2 F (36.8 C)  TempSrc: Temporal  SpO2: 98%  PainSc: 0-No pain    LUNGS: clear to auscultation  CARDIAC: Cor RRR EXTREMITIES: 2+ and pitting edema to bilateral lower extremities, TED hose on SKIN: Wound to R lower leg wrapped in Kerlix; Exposed skin is dry and intact  NEURO: Alert and oriented x 2 (person/place), generalized weakness, requires 2 person assistance with transfers   (Duration of visit and documentation 60 minutes)    Daryl Eastern, RN, BSN

## 2018-10-10 DIAGNOSIS — I5030 Unspecified diastolic (congestive) heart failure: Secondary | ICD-10-CM | POA: Diagnosis not present

## 2018-10-10 DIAGNOSIS — R0902 Hypoxemia: Secondary | ICD-10-CM | POA: Diagnosis not present

## 2018-10-10 DIAGNOSIS — L03115 Cellulitis of right lower limb: Secondary | ICD-10-CM | POA: Diagnosis not present

## 2018-10-10 DIAGNOSIS — S91002A Unspecified open wound, left ankle, initial encounter: Secondary | ICD-10-CM | POA: Diagnosis not present

## 2018-10-10 DIAGNOSIS — J189 Pneumonia, unspecified organism: Secondary | ICD-10-CM | POA: Diagnosis not present

## 2018-10-10 DIAGNOSIS — E43 Unspecified severe protein-calorie malnutrition: Secondary | ICD-10-CM | POA: Diagnosis not present

## 2018-10-16 DIAGNOSIS — R0902 Hypoxemia: Secondary | ICD-10-CM | POA: Diagnosis not present

## 2018-10-16 DIAGNOSIS — I5030 Unspecified diastolic (congestive) heart failure: Secondary | ICD-10-CM | POA: Diagnosis not present

## 2018-10-16 DIAGNOSIS — J189 Pneumonia, unspecified organism: Secondary | ICD-10-CM | POA: Diagnosis not present

## 2018-10-16 DIAGNOSIS — E43 Unspecified severe protein-calorie malnutrition: Secondary | ICD-10-CM | POA: Diagnosis not present

## 2018-10-16 DIAGNOSIS — L03115 Cellulitis of right lower limb: Secondary | ICD-10-CM | POA: Diagnosis not present

## 2018-10-16 DIAGNOSIS — S91002A Unspecified open wound, left ankle, initial encounter: Secondary | ICD-10-CM | POA: Diagnosis not present

## 2018-10-18 DIAGNOSIS — S91002A Unspecified open wound, left ankle, initial encounter: Secondary | ICD-10-CM | POA: Diagnosis not present

## 2018-10-18 DIAGNOSIS — E43 Unspecified severe protein-calorie malnutrition: Secondary | ICD-10-CM | POA: Diagnosis not present

## 2018-10-18 DIAGNOSIS — J189 Pneumonia, unspecified organism: Secondary | ICD-10-CM | POA: Diagnosis not present

## 2018-10-18 DIAGNOSIS — R0902 Hypoxemia: Secondary | ICD-10-CM | POA: Diagnosis not present

## 2018-10-18 DIAGNOSIS — I5030 Unspecified diastolic (congestive) heart failure: Secondary | ICD-10-CM | POA: Diagnosis not present

## 2018-10-18 DIAGNOSIS — L03115 Cellulitis of right lower limb: Secondary | ICD-10-CM | POA: Diagnosis not present

## 2018-10-19 DIAGNOSIS — S91001A Unspecified open wound, right ankle, initial encounter: Secondary | ICD-10-CM | POA: Diagnosis not present

## 2018-10-19 DIAGNOSIS — I70233 Atherosclerosis of native arteries of right leg with ulceration of ankle: Secondary | ICD-10-CM | POA: Diagnosis not present

## 2018-10-19 DIAGNOSIS — L97312 Non-pressure chronic ulcer of right ankle with fat layer exposed: Secondary | ICD-10-CM | POA: Diagnosis not present

## 2018-10-23 DIAGNOSIS — I5032 Chronic diastolic (congestive) heart failure: Secondary | ICD-10-CM | POA: Diagnosis not present

## 2018-10-23 DIAGNOSIS — L309 Dermatitis, unspecified: Secondary | ICD-10-CM | POA: Diagnosis not present

## 2018-10-23 DIAGNOSIS — I480 Paroxysmal atrial fibrillation: Secondary | ICD-10-CM | POA: Diagnosis not present

## 2018-10-23 DIAGNOSIS — R52 Pain, unspecified: Secondary | ICD-10-CM | POA: Diagnosis not present

## 2018-10-23 DIAGNOSIS — Z933 Colostomy status: Secondary | ICD-10-CM | POA: Diagnosis not present

## 2018-10-23 DIAGNOSIS — E039 Hypothyroidism, unspecified: Secondary | ICD-10-CM | POA: Diagnosis not present

## 2018-10-23 DIAGNOSIS — N183 Chronic kidney disease, stage 3 (moderate): Secondary | ICD-10-CM | POA: Diagnosis not present

## 2018-10-23 DIAGNOSIS — D649 Anemia, unspecified: Secondary | ICD-10-CM | POA: Diagnosis not present

## 2018-10-23 DIAGNOSIS — K5900 Constipation, unspecified: Secondary | ICD-10-CM | POA: Diagnosis not present

## 2018-10-23 DIAGNOSIS — L03115 Cellulitis of right lower limb: Secondary | ICD-10-CM | POA: Diagnosis not present

## 2018-10-23 DIAGNOSIS — M6281 Muscle weakness (generalized): Secondary | ICD-10-CM | POA: Diagnosis not present

## 2018-10-23 DIAGNOSIS — J449 Chronic obstructive pulmonary disease, unspecified: Secondary | ICD-10-CM | POA: Diagnosis not present

## 2018-10-24 DIAGNOSIS — S91002A Unspecified open wound, left ankle, initial encounter: Secondary | ICD-10-CM | POA: Diagnosis not present

## 2018-10-24 DIAGNOSIS — I5030 Unspecified diastolic (congestive) heart failure: Secondary | ICD-10-CM | POA: Diagnosis not present

## 2018-10-24 DIAGNOSIS — J189 Pneumonia, unspecified organism: Secondary | ICD-10-CM | POA: Diagnosis not present

## 2018-10-24 DIAGNOSIS — L03115 Cellulitis of right lower limb: Secondary | ICD-10-CM | POA: Diagnosis not present

## 2018-10-24 DIAGNOSIS — E43 Unspecified severe protein-calorie malnutrition: Secondary | ICD-10-CM | POA: Diagnosis not present

## 2018-10-24 DIAGNOSIS — R0902 Hypoxemia: Secondary | ICD-10-CM | POA: Diagnosis not present

## 2018-10-26 DIAGNOSIS — S91002A Unspecified open wound, left ankle, initial encounter: Secondary | ICD-10-CM | POA: Diagnosis not present

## 2018-10-26 DIAGNOSIS — J189 Pneumonia, unspecified organism: Secondary | ICD-10-CM | POA: Diagnosis not present

## 2018-10-26 DIAGNOSIS — L03115 Cellulitis of right lower limb: Secondary | ICD-10-CM | POA: Diagnosis not present

## 2018-10-26 DIAGNOSIS — I5030 Unspecified diastolic (congestive) heart failure: Secondary | ICD-10-CM | POA: Diagnosis not present

## 2018-10-26 DIAGNOSIS — R0902 Hypoxemia: Secondary | ICD-10-CM | POA: Diagnosis not present

## 2018-10-26 DIAGNOSIS — E43 Unspecified severe protein-calorie malnutrition: Secondary | ICD-10-CM | POA: Diagnosis not present

## 2018-10-30 DIAGNOSIS — R0902 Hypoxemia: Secondary | ICD-10-CM | POA: Diagnosis not present

## 2018-10-30 DIAGNOSIS — E43 Unspecified severe protein-calorie malnutrition: Secondary | ICD-10-CM | POA: Diagnosis not present

## 2018-10-30 DIAGNOSIS — S91002A Unspecified open wound, left ankle, initial encounter: Secondary | ICD-10-CM | POA: Diagnosis not present

## 2018-10-30 DIAGNOSIS — I5030 Unspecified diastolic (congestive) heart failure: Secondary | ICD-10-CM | POA: Diagnosis not present

## 2018-10-30 DIAGNOSIS — L03115 Cellulitis of right lower limb: Secondary | ICD-10-CM | POA: Diagnosis not present

## 2018-10-30 DIAGNOSIS — J189 Pneumonia, unspecified organism: Secondary | ICD-10-CM | POA: Diagnosis not present

## 2018-10-31 DIAGNOSIS — K5900 Constipation, unspecified: Secondary | ICD-10-CM | POA: Diagnosis not present

## 2018-10-31 DIAGNOSIS — J189 Pneumonia, unspecified organism: Secondary | ICD-10-CM | POA: Diagnosis not present

## 2018-10-31 DIAGNOSIS — M6281 Muscle weakness (generalized): Secondary | ICD-10-CM | POA: Diagnosis not present

## 2018-10-31 DIAGNOSIS — I480 Paroxysmal atrial fibrillation: Secondary | ICD-10-CM | POA: Diagnosis not present

## 2018-10-31 DIAGNOSIS — L309 Dermatitis, unspecified: Secondary | ICD-10-CM | POA: Diagnosis not present

## 2018-10-31 DIAGNOSIS — S91002A Unspecified open wound, left ankle, initial encounter: Secondary | ICD-10-CM | POA: Diagnosis not present

## 2018-10-31 DIAGNOSIS — J449 Chronic obstructive pulmonary disease, unspecified: Secondary | ICD-10-CM | POA: Diagnosis not present

## 2018-10-31 DIAGNOSIS — D649 Anemia, unspecified: Secondary | ICD-10-CM | POA: Diagnosis not present

## 2018-10-31 DIAGNOSIS — E43 Unspecified severe protein-calorie malnutrition: Secondary | ICD-10-CM | POA: Diagnosis not present

## 2018-10-31 DIAGNOSIS — R52 Pain, unspecified: Secondary | ICD-10-CM | POA: Diagnosis not present

## 2018-10-31 DIAGNOSIS — I5032 Chronic diastolic (congestive) heart failure: Secondary | ICD-10-CM | POA: Diagnosis not present

## 2018-10-31 DIAGNOSIS — N183 Chronic kidney disease, stage 3 (moderate): Secondary | ICD-10-CM | POA: Diagnosis not present

## 2018-10-31 DIAGNOSIS — Z933 Colostomy status: Secondary | ICD-10-CM | POA: Diagnosis not present

## 2018-10-31 DIAGNOSIS — I5030 Unspecified diastolic (congestive) heart failure: Secondary | ICD-10-CM | POA: Diagnosis not present

## 2018-10-31 DIAGNOSIS — L03115 Cellulitis of right lower limb: Secondary | ICD-10-CM | POA: Diagnosis not present

## 2018-10-31 DIAGNOSIS — E039 Hypothyroidism, unspecified: Secondary | ICD-10-CM | POA: Diagnosis not present

## 2018-10-31 DIAGNOSIS — R0902 Hypoxemia: Secondary | ICD-10-CM | POA: Diagnosis not present

## 2018-11-02 DIAGNOSIS — S91002A Unspecified open wound, left ankle, initial encounter: Secondary | ICD-10-CM | POA: Diagnosis not present

## 2018-11-02 DIAGNOSIS — E43 Unspecified severe protein-calorie malnutrition: Secondary | ICD-10-CM | POA: Diagnosis not present

## 2018-11-02 DIAGNOSIS — R0902 Hypoxemia: Secondary | ICD-10-CM | POA: Diagnosis not present

## 2018-11-02 DIAGNOSIS — I5030 Unspecified diastolic (congestive) heart failure: Secondary | ICD-10-CM | POA: Diagnosis not present

## 2018-11-02 DIAGNOSIS — J189 Pneumonia, unspecified organism: Secondary | ICD-10-CM | POA: Diagnosis not present

## 2018-11-02 DIAGNOSIS — L03115 Cellulitis of right lower limb: Secondary | ICD-10-CM | POA: Diagnosis not present

## 2018-11-03 DIAGNOSIS — L03115 Cellulitis of right lower limb: Secondary | ICD-10-CM | POA: Diagnosis not present

## 2018-11-03 DIAGNOSIS — Z933 Colostomy status: Secondary | ICD-10-CM | POA: Diagnosis not present

## 2018-11-03 DIAGNOSIS — J189 Pneumonia, unspecified organism: Secondary | ICD-10-CM | POA: Diagnosis not present

## 2018-11-03 DIAGNOSIS — E039 Hypothyroidism, unspecified: Secondary | ICD-10-CM | POA: Diagnosis not present

## 2018-11-03 DIAGNOSIS — I5030 Unspecified diastolic (congestive) heart failure: Secondary | ICD-10-CM | POA: Diagnosis not present

## 2018-11-03 DIAGNOSIS — D638 Anemia in other chronic diseases classified elsewhere: Secondary | ICD-10-CM | POA: Diagnosis not present

## 2018-11-03 DIAGNOSIS — I4891 Unspecified atrial fibrillation: Secondary | ICD-10-CM | POA: Diagnosis not present

## 2018-11-03 DIAGNOSIS — F0390 Unspecified dementia without behavioral disturbance: Secondary | ICD-10-CM | POA: Diagnosis not present

## 2018-11-03 DIAGNOSIS — E43 Unspecified severe protein-calorie malnutrition: Secondary | ICD-10-CM | POA: Diagnosis not present

## 2018-11-03 DIAGNOSIS — I25119 Atherosclerotic heart disease of native coronary artery with unspecified angina pectoris: Secondary | ICD-10-CM | POA: Diagnosis not present

## 2018-11-03 DIAGNOSIS — I70209 Unspecified atherosclerosis of native arteries of extremities, unspecified extremity: Secondary | ICD-10-CM | POA: Diagnosis not present

## 2018-11-03 DIAGNOSIS — F411 Generalized anxiety disorder: Secondary | ICD-10-CM | POA: Diagnosis not present

## 2018-11-03 DIAGNOSIS — J301 Allergic rhinitis due to pollen: Secondary | ICD-10-CM | POA: Diagnosis not present

## 2018-11-03 DIAGNOSIS — R0902 Hypoxemia: Secondary | ICD-10-CM | POA: Diagnosis not present

## 2018-11-03 DIAGNOSIS — S91002A Unspecified open wound, left ankle, initial encounter: Secondary | ICD-10-CM | POA: Diagnosis not present

## 2018-11-03 DIAGNOSIS — I10 Essential (primary) hypertension: Secondary | ICD-10-CM | POA: Diagnosis not present

## 2018-11-03 DIAGNOSIS — N183 Chronic kidney disease, stage 3 (moderate): Secondary | ICD-10-CM | POA: Diagnosis not present

## 2018-11-05 DIAGNOSIS — R0902 Hypoxemia: Secondary | ICD-10-CM | POA: Diagnosis not present

## 2018-11-05 DIAGNOSIS — I5030 Unspecified diastolic (congestive) heart failure: Secondary | ICD-10-CM | POA: Diagnosis not present

## 2018-11-05 DIAGNOSIS — L03115 Cellulitis of right lower limb: Secondary | ICD-10-CM | POA: Diagnosis not present

## 2018-11-05 DIAGNOSIS — J189 Pneumonia, unspecified organism: Secondary | ICD-10-CM | POA: Diagnosis not present

## 2018-11-05 DIAGNOSIS — S91002A Unspecified open wound, left ankle, initial encounter: Secondary | ICD-10-CM | POA: Diagnosis not present

## 2018-11-05 DIAGNOSIS — E43 Unspecified severe protein-calorie malnutrition: Secondary | ICD-10-CM | POA: Diagnosis not present

## 2018-11-07 DIAGNOSIS — I5030 Unspecified diastolic (congestive) heart failure: Secondary | ICD-10-CM | POA: Diagnosis not present

## 2018-11-07 DIAGNOSIS — S91002A Unspecified open wound, left ankle, initial encounter: Secondary | ICD-10-CM | POA: Diagnosis not present

## 2018-11-07 DIAGNOSIS — R0902 Hypoxemia: Secondary | ICD-10-CM | POA: Diagnosis not present

## 2018-11-07 DIAGNOSIS — J189 Pneumonia, unspecified organism: Secondary | ICD-10-CM | POA: Diagnosis not present

## 2018-11-07 DIAGNOSIS — E43 Unspecified severe protein-calorie malnutrition: Secondary | ICD-10-CM | POA: Diagnosis not present

## 2018-11-07 DIAGNOSIS — L03115 Cellulitis of right lower limb: Secondary | ICD-10-CM | POA: Diagnosis not present

## 2018-11-09 DIAGNOSIS — R0902 Hypoxemia: Secondary | ICD-10-CM | POA: Diagnosis not present

## 2018-11-09 DIAGNOSIS — S91002A Unspecified open wound, left ankle, initial encounter: Secondary | ICD-10-CM | POA: Diagnosis not present

## 2018-11-09 DIAGNOSIS — L03115 Cellulitis of right lower limb: Secondary | ICD-10-CM | POA: Diagnosis not present

## 2018-11-09 DIAGNOSIS — J189 Pneumonia, unspecified organism: Secondary | ICD-10-CM | POA: Diagnosis not present

## 2018-11-09 DIAGNOSIS — E43 Unspecified severe protein-calorie malnutrition: Secondary | ICD-10-CM | POA: Diagnosis not present

## 2018-11-09 DIAGNOSIS — I5030 Unspecified diastolic (congestive) heart failure: Secondary | ICD-10-CM | POA: Diagnosis not present

## 2018-11-12 DIAGNOSIS — I5030 Unspecified diastolic (congestive) heart failure: Secondary | ICD-10-CM | POA: Diagnosis not present

## 2018-11-12 DIAGNOSIS — R0902 Hypoxemia: Secondary | ICD-10-CM | POA: Diagnosis not present

## 2018-11-12 DIAGNOSIS — L03115 Cellulitis of right lower limb: Secondary | ICD-10-CM | POA: Diagnosis not present

## 2018-11-12 DIAGNOSIS — S91002A Unspecified open wound, left ankle, initial encounter: Secondary | ICD-10-CM | POA: Diagnosis not present

## 2018-11-12 DIAGNOSIS — J189 Pneumonia, unspecified organism: Secondary | ICD-10-CM | POA: Diagnosis not present

## 2018-11-12 DIAGNOSIS — E43 Unspecified severe protein-calorie malnutrition: Secondary | ICD-10-CM | POA: Diagnosis not present

## 2018-11-14 DIAGNOSIS — E43 Unspecified severe protein-calorie malnutrition: Secondary | ICD-10-CM | POA: Diagnosis not present

## 2018-11-14 DIAGNOSIS — S91002A Unspecified open wound, left ankle, initial encounter: Secondary | ICD-10-CM | POA: Diagnosis not present

## 2018-11-14 DIAGNOSIS — I5030 Unspecified diastolic (congestive) heart failure: Secondary | ICD-10-CM | POA: Diagnosis not present

## 2018-11-14 DIAGNOSIS — R0902 Hypoxemia: Secondary | ICD-10-CM | POA: Diagnosis not present

## 2018-11-14 DIAGNOSIS — L03115 Cellulitis of right lower limb: Secondary | ICD-10-CM | POA: Diagnosis not present

## 2018-11-14 DIAGNOSIS — J189 Pneumonia, unspecified organism: Secondary | ICD-10-CM | POA: Diagnosis not present

## 2018-11-15 DIAGNOSIS — K5900 Constipation, unspecified: Secondary | ICD-10-CM | POA: Diagnosis not present

## 2018-11-15 DIAGNOSIS — I480 Paroxysmal atrial fibrillation: Secondary | ICD-10-CM | POA: Diagnosis not present

## 2018-11-15 DIAGNOSIS — J449 Chronic obstructive pulmonary disease, unspecified: Secondary | ICD-10-CM | POA: Diagnosis not present

## 2018-11-15 DIAGNOSIS — Z933 Colostomy status: Secondary | ICD-10-CM | POA: Diagnosis not present

## 2018-11-15 DIAGNOSIS — E039 Hypothyroidism, unspecified: Secondary | ICD-10-CM | POA: Diagnosis not present

## 2018-11-15 DIAGNOSIS — J309 Allergic rhinitis, unspecified: Secondary | ICD-10-CM | POA: Diagnosis not present

## 2018-11-15 DIAGNOSIS — R54 Age-related physical debility: Secondary | ICD-10-CM | POA: Diagnosis not present

## 2018-11-15 DIAGNOSIS — I5032 Chronic diastolic (congestive) heart failure: Secondary | ICD-10-CM | POA: Diagnosis not present

## 2018-11-15 DIAGNOSIS — L89612 Pressure ulcer of right heel, stage 2: Secondary | ICD-10-CM | POA: Diagnosis not present

## 2018-11-15 DIAGNOSIS — R52 Pain, unspecified: Secondary | ICD-10-CM | POA: Diagnosis not present

## 2018-11-15 DIAGNOSIS — D649 Anemia, unspecified: Secondary | ICD-10-CM | POA: Diagnosis not present

## 2018-11-15 DIAGNOSIS — N183 Chronic kidney disease, stage 3 (moderate): Secondary | ICD-10-CM | POA: Diagnosis not present

## 2018-11-16 DIAGNOSIS — E43 Unspecified severe protein-calorie malnutrition: Secondary | ICD-10-CM | POA: Diagnosis not present

## 2018-11-16 DIAGNOSIS — L03115 Cellulitis of right lower limb: Secondary | ICD-10-CM | POA: Diagnosis not present

## 2018-11-16 DIAGNOSIS — S91002A Unspecified open wound, left ankle, initial encounter: Secondary | ICD-10-CM | POA: Diagnosis not present

## 2018-11-16 DIAGNOSIS — J189 Pneumonia, unspecified organism: Secondary | ICD-10-CM | POA: Diagnosis not present

## 2018-11-16 DIAGNOSIS — R0902 Hypoxemia: Secondary | ICD-10-CM | POA: Diagnosis not present

## 2018-11-16 DIAGNOSIS — I5030 Unspecified diastolic (congestive) heart failure: Secondary | ICD-10-CM | POA: Diagnosis not present

## 2018-11-21 DIAGNOSIS — L03115 Cellulitis of right lower limb: Secondary | ICD-10-CM | POA: Diagnosis not present

## 2018-11-21 DIAGNOSIS — R0902 Hypoxemia: Secondary | ICD-10-CM | POA: Diagnosis not present

## 2018-11-21 DIAGNOSIS — B379 Candidiasis, unspecified: Secondary | ICD-10-CM | POA: Diagnosis not present

## 2018-11-21 DIAGNOSIS — S91002A Unspecified open wound, left ankle, initial encounter: Secondary | ICD-10-CM | POA: Diagnosis not present

## 2018-11-21 DIAGNOSIS — E43 Unspecified severe protein-calorie malnutrition: Secondary | ICD-10-CM | POA: Diagnosis not present

## 2018-11-21 DIAGNOSIS — I5030 Unspecified diastolic (congestive) heart failure: Secondary | ICD-10-CM | POA: Diagnosis not present

## 2018-11-21 DIAGNOSIS — J189 Pneumonia, unspecified organism: Secondary | ICD-10-CM | POA: Diagnosis not present

## 2018-11-23 DIAGNOSIS — R0902 Hypoxemia: Secondary | ICD-10-CM | POA: Diagnosis not present

## 2018-11-23 DIAGNOSIS — I5030 Unspecified diastolic (congestive) heart failure: Secondary | ICD-10-CM | POA: Diagnosis not present

## 2018-11-23 DIAGNOSIS — J189 Pneumonia, unspecified organism: Secondary | ICD-10-CM | POA: Diagnosis not present

## 2018-11-23 DIAGNOSIS — S91002A Unspecified open wound, left ankle, initial encounter: Secondary | ICD-10-CM | POA: Diagnosis not present

## 2018-11-23 DIAGNOSIS — E43 Unspecified severe protein-calorie malnutrition: Secondary | ICD-10-CM | POA: Diagnosis not present

## 2018-11-23 DIAGNOSIS — L03115 Cellulitis of right lower limb: Secondary | ICD-10-CM | POA: Diagnosis not present

## 2018-11-26 DIAGNOSIS — S91002A Unspecified open wound, left ankle, initial encounter: Secondary | ICD-10-CM | POA: Diagnosis not present

## 2018-11-26 DIAGNOSIS — E43 Unspecified severe protein-calorie malnutrition: Secondary | ICD-10-CM | POA: Diagnosis not present

## 2018-11-26 DIAGNOSIS — I5030 Unspecified diastolic (congestive) heart failure: Secondary | ICD-10-CM | POA: Diagnosis not present

## 2018-11-26 DIAGNOSIS — L209 Atopic dermatitis, unspecified: Secondary | ICD-10-CM | POA: Diagnosis not present

## 2018-11-26 DIAGNOSIS — L03115 Cellulitis of right lower limb: Secondary | ICD-10-CM | POA: Diagnosis not present

## 2018-11-26 DIAGNOSIS — R300 Dysuria: Secondary | ICD-10-CM | POA: Diagnosis not present

## 2018-11-26 DIAGNOSIS — R0902 Hypoxemia: Secondary | ICD-10-CM | POA: Diagnosis not present

## 2018-11-26 DIAGNOSIS — J189 Pneumonia, unspecified organism: Secondary | ICD-10-CM | POA: Diagnosis not present

## 2018-11-26 DIAGNOSIS — B379 Candidiasis, unspecified: Secondary | ICD-10-CM | POA: Diagnosis not present

## 2018-11-26 DIAGNOSIS — R350 Frequency of micturition: Secondary | ICD-10-CM | POA: Diagnosis not present

## 2018-11-28 DIAGNOSIS — S91002A Unspecified open wound, left ankle, initial encounter: Secondary | ICD-10-CM | POA: Diagnosis not present

## 2018-11-28 DIAGNOSIS — E43 Unspecified severe protein-calorie malnutrition: Secondary | ICD-10-CM | POA: Diagnosis not present

## 2018-11-28 DIAGNOSIS — J189 Pneumonia, unspecified organism: Secondary | ICD-10-CM | POA: Diagnosis not present

## 2018-11-28 DIAGNOSIS — R0902 Hypoxemia: Secondary | ICD-10-CM | POA: Diagnosis not present

## 2018-11-28 DIAGNOSIS — L03115 Cellulitis of right lower limb: Secondary | ICD-10-CM | POA: Diagnosis not present

## 2018-11-28 DIAGNOSIS — I5030 Unspecified diastolic (congestive) heart failure: Secondary | ICD-10-CM | POA: Diagnosis not present

## 2018-11-29 DIAGNOSIS — N390 Urinary tract infection, site not specified: Secondary | ICD-10-CM | POA: Diagnosis not present

## 2018-11-29 DIAGNOSIS — R319 Hematuria, unspecified: Secondary | ICD-10-CM | POA: Diagnosis not present

## 2018-11-29 DIAGNOSIS — Z79899 Other long term (current) drug therapy: Secondary | ICD-10-CM | POA: Diagnosis not present

## 2018-11-30 DIAGNOSIS — E43 Unspecified severe protein-calorie malnutrition: Secondary | ICD-10-CM | POA: Diagnosis not present

## 2018-11-30 DIAGNOSIS — I5030 Unspecified diastolic (congestive) heart failure: Secondary | ICD-10-CM | POA: Diagnosis not present

## 2018-11-30 DIAGNOSIS — L03115 Cellulitis of right lower limb: Secondary | ICD-10-CM | POA: Diagnosis not present

## 2018-11-30 DIAGNOSIS — S91002A Unspecified open wound, left ankle, initial encounter: Secondary | ICD-10-CM | POA: Diagnosis not present

## 2018-11-30 DIAGNOSIS — R0902 Hypoxemia: Secondary | ICD-10-CM | POA: Diagnosis not present

## 2018-11-30 DIAGNOSIS — J189 Pneumonia, unspecified organism: Secondary | ICD-10-CM | POA: Diagnosis not present

## 2018-12-02 DIAGNOSIS — Z933 Colostomy status: Secondary | ICD-10-CM | POA: Diagnosis not present

## 2018-12-02 DIAGNOSIS — I4891 Unspecified atrial fibrillation: Secondary | ICD-10-CM | POA: Diagnosis not present

## 2018-12-02 DIAGNOSIS — E039 Hypothyroidism, unspecified: Secondary | ICD-10-CM | POA: Diagnosis not present

## 2018-12-02 DIAGNOSIS — I10 Essential (primary) hypertension: Secondary | ICD-10-CM | POA: Diagnosis not present

## 2018-12-02 DIAGNOSIS — F411 Generalized anxiety disorder: Secondary | ICD-10-CM | POA: Diagnosis not present

## 2018-12-02 DIAGNOSIS — N183 Chronic kidney disease, stage 3 (moderate): Secondary | ICD-10-CM | POA: Diagnosis not present

## 2018-12-02 DIAGNOSIS — F0390 Unspecified dementia without behavioral disturbance: Secondary | ICD-10-CM | POA: Diagnosis not present

## 2018-12-02 DIAGNOSIS — I5030 Unspecified diastolic (congestive) heart failure: Secondary | ICD-10-CM | POA: Diagnosis not present

## 2018-12-02 DIAGNOSIS — D638 Anemia in other chronic diseases classified elsewhere: Secondary | ICD-10-CM | POA: Diagnosis not present

## 2018-12-02 DIAGNOSIS — I25119 Atherosclerotic heart disease of native coronary artery with unspecified angina pectoris: Secondary | ICD-10-CM | POA: Diagnosis not present

## 2018-12-02 DIAGNOSIS — E43 Unspecified severe protein-calorie malnutrition: Secondary | ICD-10-CM | POA: Diagnosis not present

## 2018-12-02 DIAGNOSIS — R0902 Hypoxemia: Secondary | ICD-10-CM | POA: Diagnosis not present

## 2018-12-02 DIAGNOSIS — S91002A Unspecified open wound, left ankle, initial encounter: Secondary | ICD-10-CM | POA: Diagnosis not present

## 2018-12-02 DIAGNOSIS — I70209 Unspecified atherosclerosis of native arteries of extremities, unspecified extremity: Secondary | ICD-10-CM | POA: Diagnosis not present

## 2018-12-02 DIAGNOSIS — J301 Allergic rhinitis due to pollen: Secondary | ICD-10-CM | POA: Diagnosis not present

## 2018-12-02 DIAGNOSIS — J189 Pneumonia, unspecified organism: Secondary | ICD-10-CM | POA: Diagnosis not present

## 2018-12-02 DIAGNOSIS — L03115 Cellulitis of right lower limb: Secondary | ICD-10-CM | POA: Diagnosis not present

## 2018-12-03 DIAGNOSIS — I5030 Unspecified diastolic (congestive) heart failure: Secondary | ICD-10-CM | POA: Diagnosis not present

## 2018-12-03 DIAGNOSIS — J189 Pneumonia, unspecified organism: Secondary | ICD-10-CM | POA: Diagnosis not present

## 2018-12-03 DIAGNOSIS — S91002A Unspecified open wound, left ankle, initial encounter: Secondary | ICD-10-CM | POA: Diagnosis not present

## 2018-12-03 DIAGNOSIS — L03115 Cellulitis of right lower limb: Secondary | ICD-10-CM | POA: Diagnosis not present

## 2018-12-03 DIAGNOSIS — R0902 Hypoxemia: Secondary | ICD-10-CM | POA: Diagnosis not present

## 2018-12-03 DIAGNOSIS — E43 Unspecified severe protein-calorie malnutrition: Secondary | ICD-10-CM | POA: Diagnosis not present

## 2018-12-04 DIAGNOSIS — E43 Unspecified severe protein-calorie malnutrition: Secondary | ICD-10-CM | POA: Diagnosis not present

## 2018-12-04 DIAGNOSIS — S91002A Unspecified open wound, left ankle, initial encounter: Secondary | ICD-10-CM | POA: Diagnosis not present

## 2018-12-04 DIAGNOSIS — R0902 Hypoxemia: Secondary | ICD-10-CM | POA: Diagnosis not present

## 2018-12-04 DIAGNOSIS — I5030 Unspecified diastolic (congestive) heart failure: Secondary | ICD-10-CM | POA: Diagnosis not present

## 2018-12-04 DIAGNOSIS — L03115 Cellulitis of right lower limb: Secondary | ICD-10-CM | POA: Diagnosis not present

## 2018-12-04 DIAGNOSIS — J189 Pneumonia, unspecified organism: Secondary | ICD-10-CM | POA: Diagnosis not present

## 2018-12-05 DIAGNOSIS — S91002A Unspecified open wound, left ankle, initial encounter: Secondary | ICD-10-CM | POA: Diagnosis not present

## 2018-12-05 DIAGNOSIS — E43 Unspecified severe protein-calorie malnutrition: Secondary | ICD-10-CM | POA: Diagnosis not present

## 2018-12-05 DIAGNOSIS — I5030 Unspecified diastolic (congestive) heart failure: Secondary | ICD-10-CM | POA: Diagnosis not present

## 2018-12-05 DIAGNOSIS — J189 Pneumonia, unspecified organism: Secondary | ICD-10-CM | POA: Diagnosis not present

## 2018-12-05 DIAGNOSIS — L03115 Cellulitis of right lower limb: Secondary | ICD-10-CM | POA: Diagnosis not present

## 2018-12-05 DIAGNOSIS — R0902 Hypoxemia: Secondary | ICD-10-CM | POA: Diagnosis not present

## 2018-12-06 DIAGNOSIS — J309 Allergic rhinitis, unspecified: Secondary | ICD-10-CM | POA: Diagnosis not present

## 2018-12-06 DIAGNOSIS — R54 Age-related physical debility: Secondary | ICD-10-CM | POA: Diagnosis not present

## 2018-12-06 DIAGNOSIS — Z933 Colostomy status: Secondary | ICD-10-CM | POA: Diagnosis not present

## 2018-12-06 DIAGNOSIS — L89612 Pressure ulcer of right heel, stage 2: Secondary | ICD-10-CM | POA: Diagnosis not present

## 2018-12-06 DIAGNOSIS — D649 Anemia, unspecified: Secondary | ICD-10-CM | POA: Diagnosis not present

## 2018-12-06 DIAGNOSIS — J449 Chronic obstructive pulmonary disease, unspecified: Secondary | ICD-10-CM | POA: Diagnosis not present

## 2018-12-06 DIAGNOSIS — R627 Adult failure to thrive: Secondary | ICD-10-CM | POA: Diagnosis not present

## 2018-12-06 DIAGNOSIS — I5032 Chronic diastolic (congestive) heart failure: Secondary | ICD-10-CM | POA: Diagnosis not present

## 2018-12-06 DIAGNOSIS — N183 Chronic kidney disease, stage 3 (moderate): Secondary | ICD-10-CM | POA: Diagnosis not present

## 2018-12-06 DIAGNOSIS — I480 Paroxysmal atrial fibrillation: Secondary | ICD-10-CM | POA: Diagnosis not present

## 2018-12-06 DIAGNOSIS — K5900 Constipation, unspecified: Secondary | ICD-10-CM | POA: Diagnosis not present

## 2018-12-06 DIAGNOSIS — R52 Pain, unspecified: Secondary | ICD-10-CM | POA: Diagnosis not present

## 2018-12-07 DIAGNOSIS — I5030 Unspecified diastolic (congestive) heart failure: Secondary | ICD-10-CM | POA: Diagnosis not present

## 2018-12-07 DIAGNOSIS — R0902 Hypoxemia: Secondary | ICD-10-CM | POA: Diagnosis not present

## 2018-12-07 DIAGNOSIS — L03115 Cellulitis of right lower limb: Secondary | ICD-10-CM | POA: Diagnosis not present

## 2018-12-07 DIAGNOSIS — E43 Unspecified severe protein-calorie malnutrition: Secondary | ICD-10-CM | POA: Diagnosis not present

## 2018-12-07 DIAGNOSIS — J189 Pneumonia, unspecified organism: Secondary | ICD-10-CM | POA: Diagnosis not present

## 2018-12-07 DIAGNOSIS — S91002A Unspecified open wound, left ankle, initial encounter: Secondary | ICD-10-CM | POA: Diagnosis not present

## 2018-12-09 DIAGNOSIS — L03115 Cellulitis of right lower limb: Secondary | ICD-10-CM | POA: Diagnosis not present

## 2018-12-09 DIAGNOSIS — I5030 Unspecified diastolic (congestive) heart failure: Secondary | ICD-10-CM | POA: Diagnosis not present

## 2018-12-09 DIAGNOSIS — E43 Unspecified severe protein-calorie malnutrition: Secondary | ICD-10-CM | POA: Diagnosis not present

## 2018-12-09 DIAGNOSIS — J189 Pneumonia, unspecified organism: Secondary | ICD-10-CM | POA: Diagnosis not present

## 2018-12-09 DIAGNOSIS — R0902 Hypoxemia: Secondary | ICD-10-CM | POA: Diagnosis not present

## 2018-12-09 DIAGNOSIS — S91002A Unspecified open wound, left ankle, initial encounter: Secondary | ICD-10-CM | POA: Diagnosis not present

## 2018-12-10 DIAGNOSIS — J189 Pneumonia, unspecified organism: Secondary | ICD-10-CM | POA: Diagnosis not present

## 2018-12-10 DIAGNOSIS — E43 Unspecified severe protein-calorie malnutrition: Secondary | ICD-10-CM | POA: Diagnosis not present

## 2018-12-10 DIAGNOSIS — L03115 Cellulitis of right lower limb: Secondary | ICD-10-CM | POA: Diagnosis not present

## 2018-12-10 DIAGNOSIS — I5030 Unspecified diastolic (congestive) heart failure: Secondary | ICD-10-CM | POA: Diagnosis not present

## 2018-12-10 DIAGNOSIS — S91002A Unspecified open wound, left ankle, initial encounter: Secondary | ICD-10-CM | POA: Diagnosis not present

## 2018-12-10 DIAGNOSIS — R0902 Hypoxemia: Secondary | ICD-10-CM | POA: Diagnosis not present

## 2018-12-11 DIAGNOSIS — Z79899 Other long term (current) drug therapy: Secondary | ICD-10-CM | POA: Diagnosis not present

## 2018-12-11 DIAGNOSIS — N390 Urinary tract infection, site not specified: Secondary | ICD-10-CM | POA: Diagnosis not present

## 2018-12-11 DIAGNOSIS — R319 Hematuria, unspecified: Secondary | ICD-10-CM | POA: Diagnosis not present

## 2018-12-12 DIAGNOSIS — J189 Pneumonia, unspecified organism: Secondary | ICD-10-CM | POA: Diagnosis not present

## 2018-12-12 DIAGNOSIS — S91002A Unspecified open wound, left ankle, initial encounter: Secondary | ICD-10-CM | POA: Diagnosis not present

## 2018-12-12 DIAGNOSIS — R0902 Hypoxemia: Secondary | ICD-10-CM | POA: Diagnosis not present

## 2018-12-12 DIAGNOSIS — I5030 Unspecified diastolic (congestive) heart failure: Secondary | ICD-10-CM | POA: Diagnosis not present

## 2018-12-12 DIAGNOSIS — E43 Unspecified severe protein-calorie malnutrition: Secondary | ICD-10-CM | POA: Diagnosis not present

## 2018-12-12 DIAGNOSIS — L03115 Cellulitis of right lower limb: Secondary | ICD-10-CM | POA: Diagnosis not present

## 2018-12-13 DIAGNOSIS — S91002A Unspecified open wound, left ankle, initial encounter: Secondary | ICD-10-CM | POA: Diagnosis not present

## 2018-12-13 DIAGNOSIS — E43 Unspecified severe protein-calorie malnutrition: Secondary | ICD-10-CM | POA: Diagnosis not present

## 2018-12-13 DIAGNOSIS — I5030 Unspecified diastolic (congestive) heart failure: Secondary | ICD-10-CM | POA: Diagnosis not present

## 2018-12-13 DIAGNOSIS — L03115 Cellulitis of right lower limb: Secondary | ICD-10-CM | POA: Diagnosis not present

## 2018-12-13 DIAGNOSIS — R0902 Hypoxemia: Secondary | ICD-10-CM | POA: Diagnosis not present

## 2018-12-13 DIAGNOSIS — J189 Pneumonia, unspecified organism: Secondary | ICD-10-CM | POA: Diagnosis not present

## 2018-12-14 DIAGNOSIS — L03115 Cellulitis of right lower limb: Secondary | ICD-10-CM | POA: Diagnosis not present

## 2018-12-14 DIAGNOSIS — E43 Unspecified severe protein-calorie malnutrition: Secondary | ICD-10-CM | POA: Diagnosis not present

## 2018-12-14 DIAGNOSIS — S91002A Unspecified open wound, left ankle, initial encounter: Secondary | ICD-10-CM | POA: Diagnosis not present

## 2018-12-14 DIAGNOSIS — I5030 Unspecified diastolic (congestive) heart failure: Secondary | ICD-10-CM | POA: Diagnosis not present

## 2018-12-14 DIAGNOSIS — J189 Pneumonia, unspecified organism: Secondary | ICD-10-CM | POA: Diagnosis not present

## 2018-12-14 DIAGNOSIS — R0902 Hypoxemia: Secondary | ICD-10-CM | POA: Diagnosis not present

## 2018-12-16 DIAGNOSIS — I5030 Unspecified diastolic (congestive) heart failure: Secondary | ICD-10-CM | POA: Diagnosis not present

## 2018-12-16 DIAGNOSIS — L03115 Cellulitis of right lower limb: Secondary | ICD-10-CM | POA: Diagnosis not present

## 2018-12-16 DIAGNOSIS — E43 Unspecified severe protein-calorie malnutrition: Secondary | ICD-10-CM | POA: Diagnosis not present

## 2018-12-16 DIAGNOSIS — J189 Pneumonia, unspecified organism: Secondary | ICD-10-CM | POA: Diagnosis not present

## 2018-12-16 DIAGNOSIS — S91002A Unspecified open wound, left ankle, initial encounter: Secondary | ICD-10-CM | POA: Diagnosis not present

## 2018-12-16 DIAGNOSIS — R0902 Hypoxemia: Secondary | ICD-10-CM | POA: Diagnosis not present

## 2018-12-17 DIAGNOSIS — E43 Unspecified severe protein-calorie malnutrition: Secondary | ICD-10-CM | POA: Diagnosis not present

## 2018-12-17 DIAGNOSIS — J189 Pneumonia, unspecified organism: Secondary | ICD-10-CM | POA: Diagnosis not present

## 2018-12-17 DIAGNOSIS — S91002A Unspecified open wound, left ankle, initial encounter: Secondary | ICD-10-CM | POA: Diagnosis not present

## 2018-12-17 DIAGNOSIS — I5030 Unspecified diastolic (congestive) heart failure: Secondary | ICD-10-CM | POA: Diagnosis not present

## 2018-12-17 DIAGNOSIS — L03115 Cellulitis of right lower limb: Secondary | ICD-10-CM | POA: Diagnosis not present

## 2018-12-17 DIAGNOSIS — R0902 Hypoxemia: Secondary | ICD-10-CM | POA: Diagnosis not present

## 2019-01-02 DEATH — deceased

## 2019-09-21 IMAGING — DX DG TIBIA/FIBULA 2V*R*
4 series · 4 of 4 positions shown · non-contrast
Comparison: None.

CLINICAL DATA: Infection, swelling.  Rule out soft tissue gas.

EXAM:
RIGHT TIBIA AND FIBULA - 2 VIEW

[x tib-fib ap right (1 of 2)]
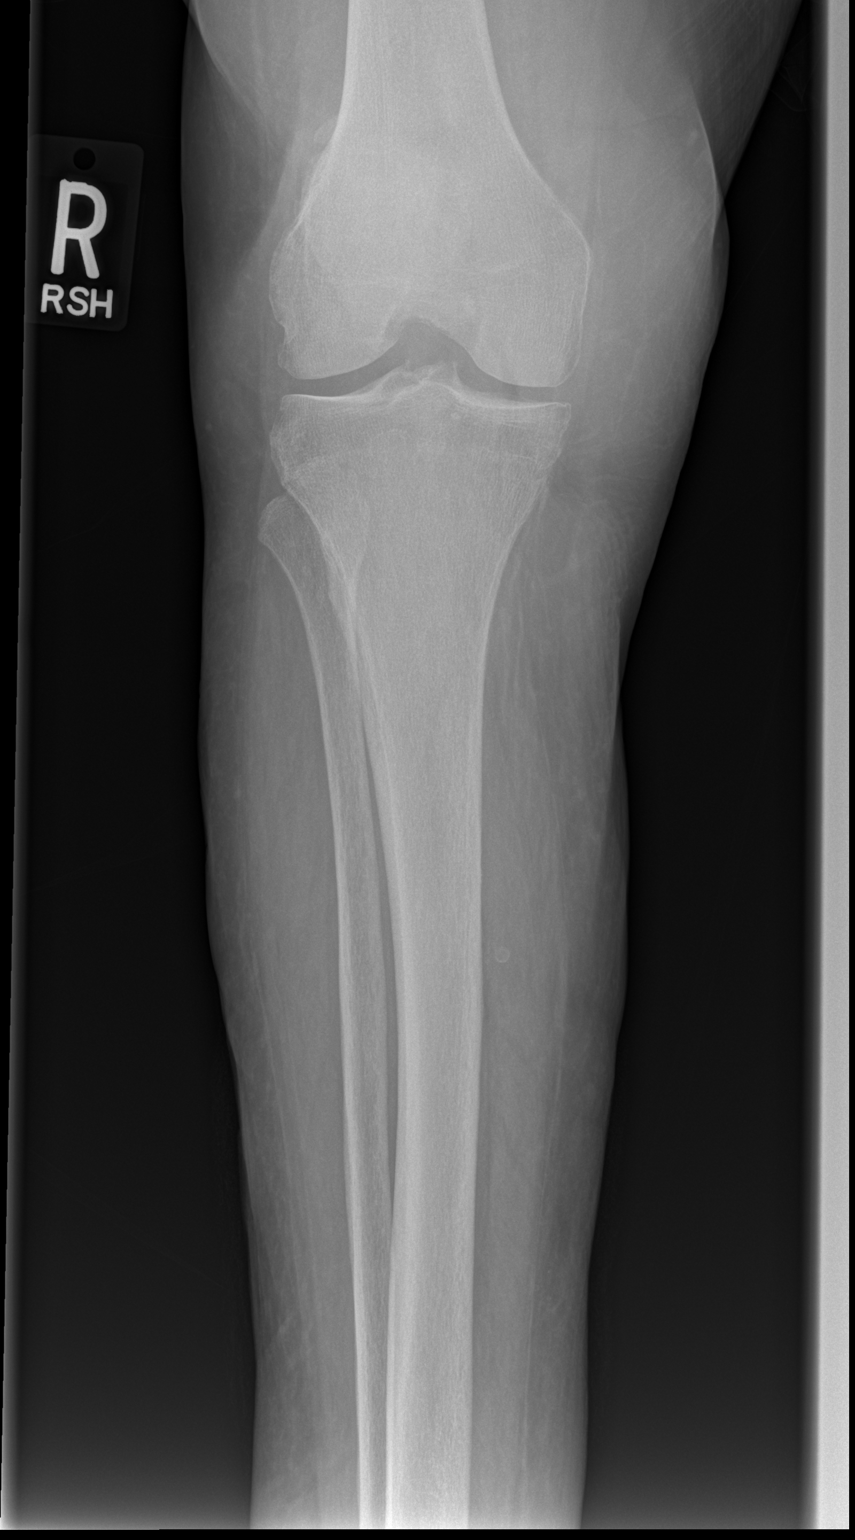

[x tib-fib ap right (2 of 2)]
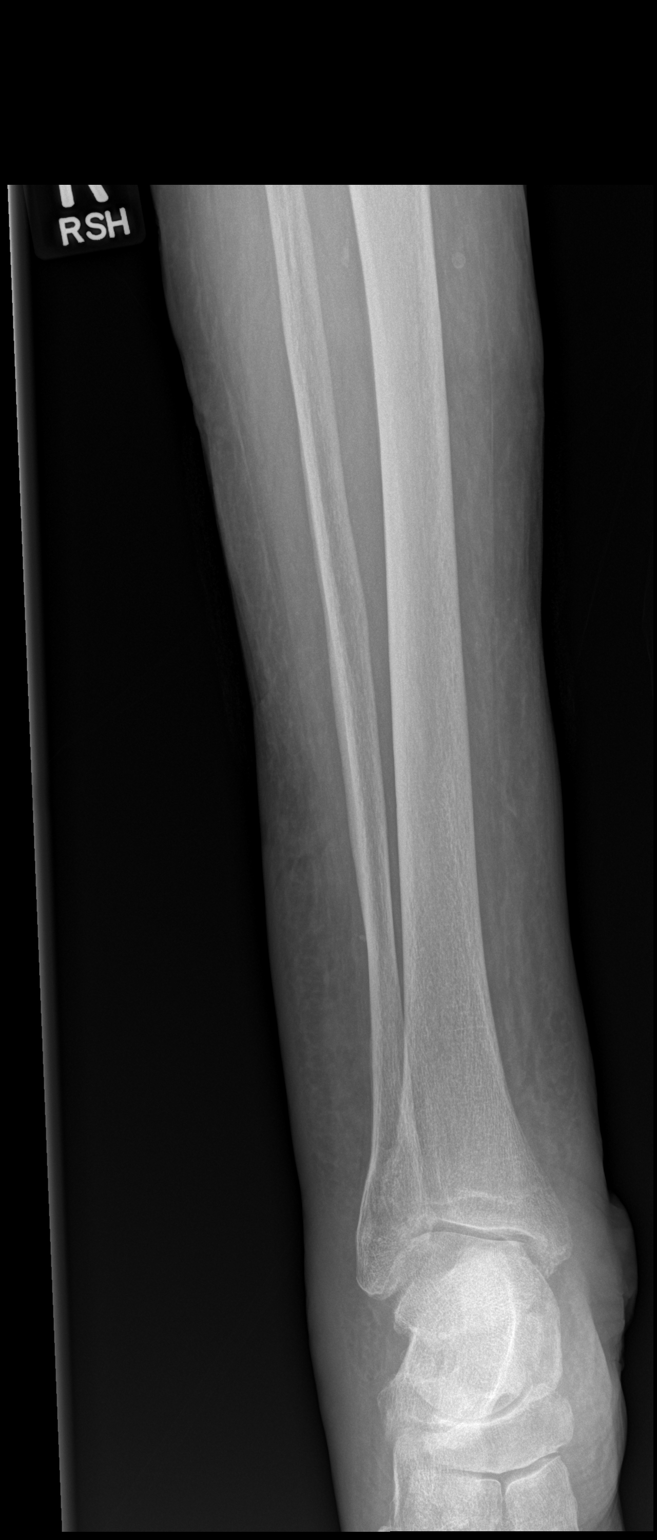

[x tib-fib lat right (1 of 2)]
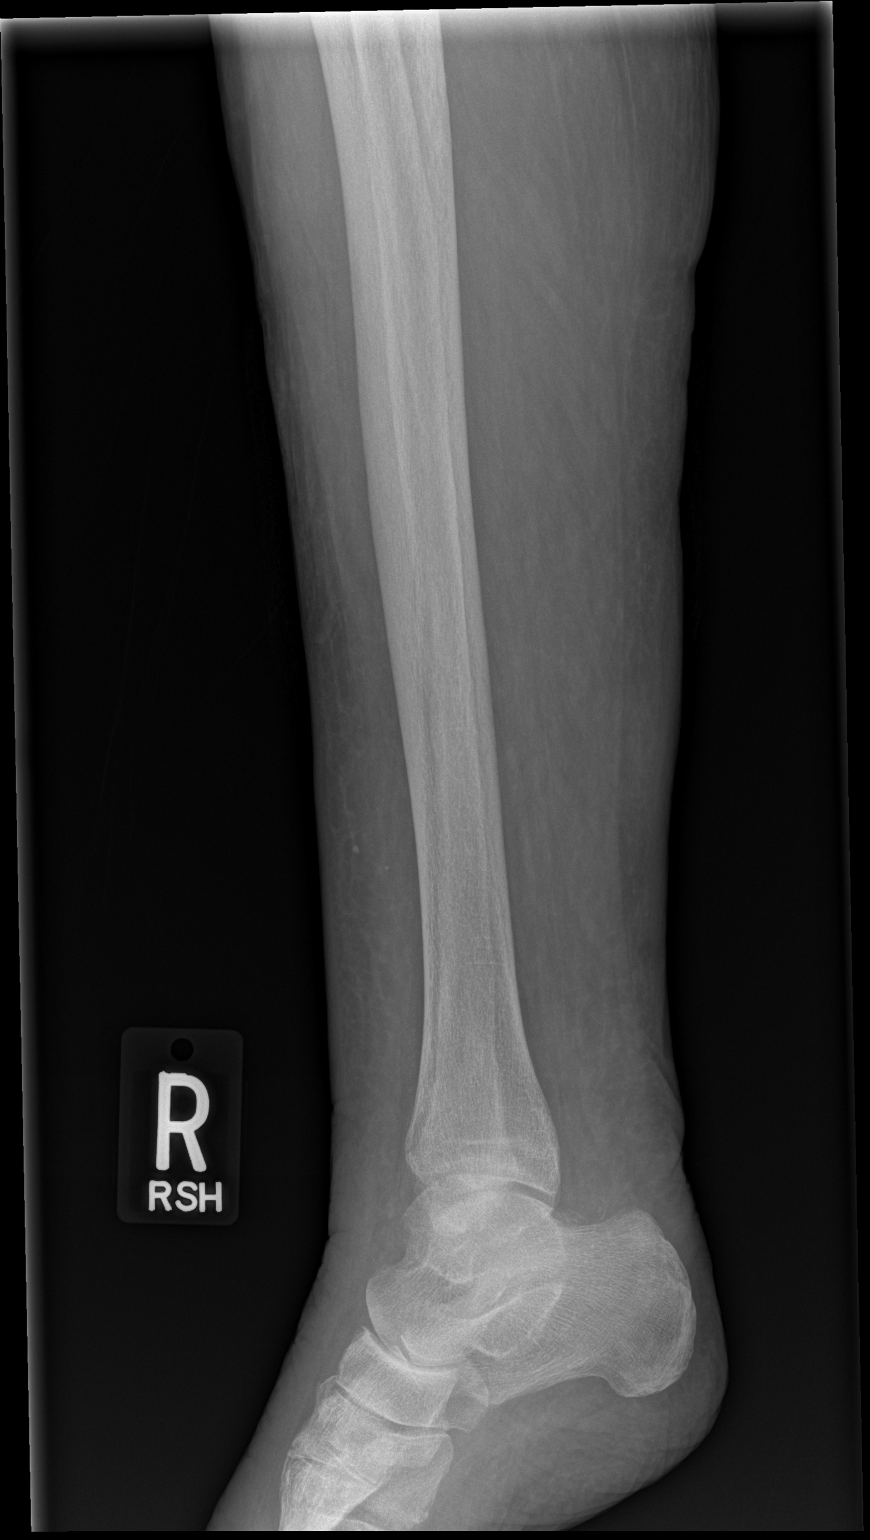

[x tib-fib lat right (2 of 2)]
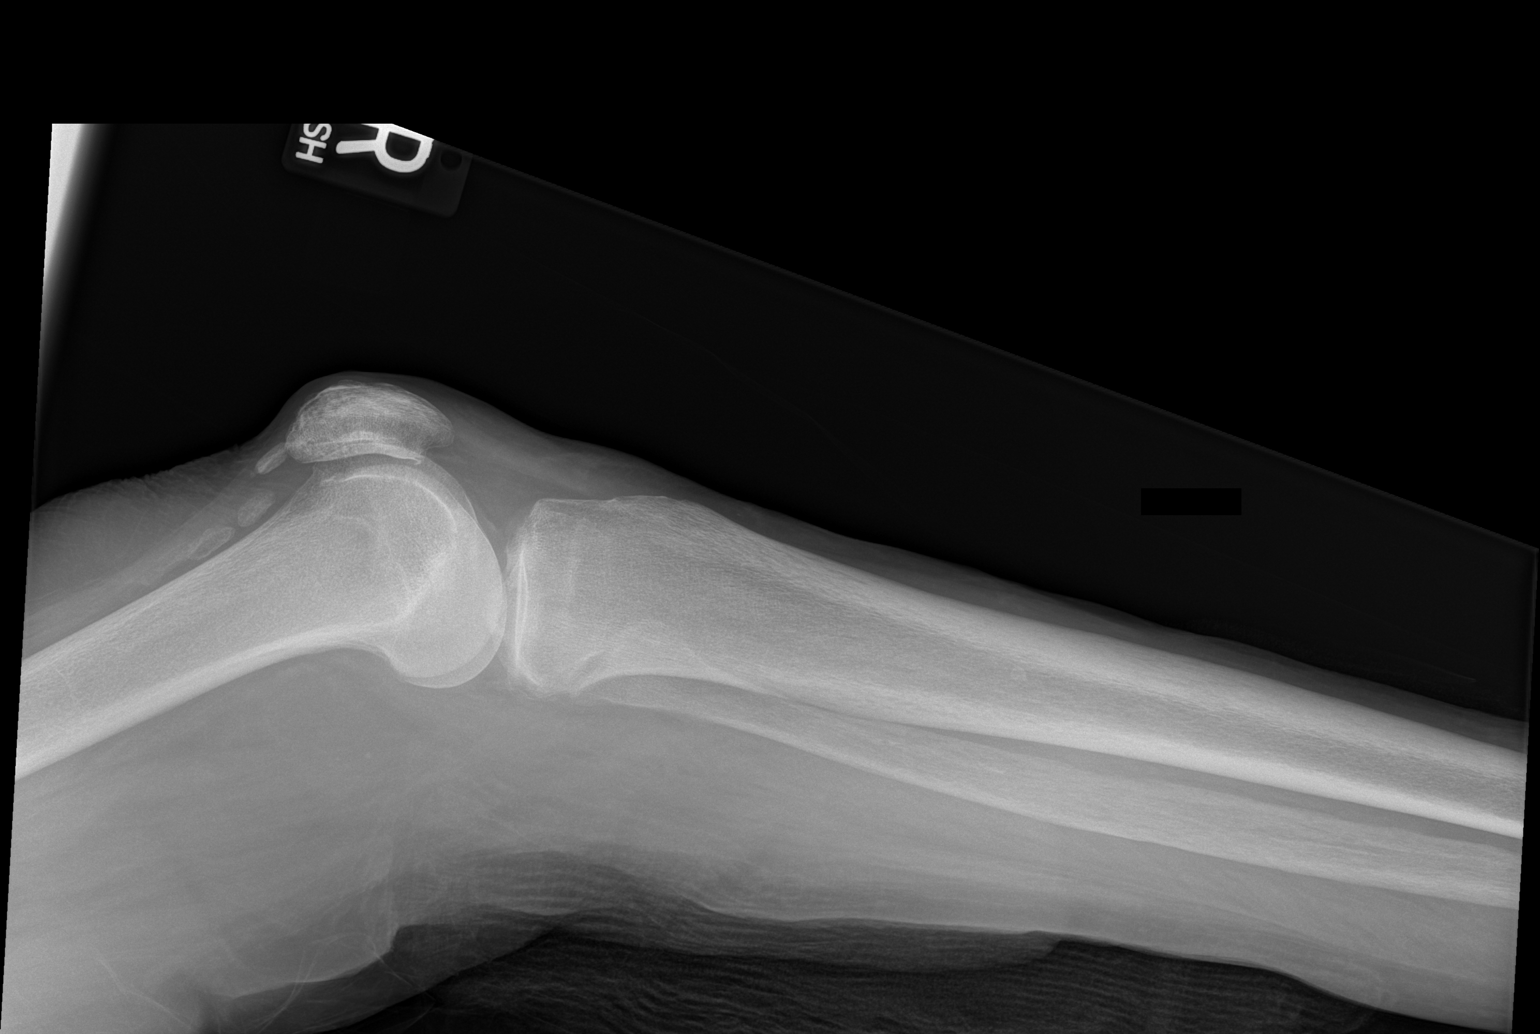

[4 of 4 positions shown; findings below may reference images not displayed]

FINDINGS: No soft tissue gas.  No acute or suspicious osseous finding.
IMPRESSION: Negative.
